# Patient Record
Sex: Female | Born: 1970 | Race: White | Hispanic: No | Marital: Single | Smoking: Never smoker
Health system: Southern US, Community
[De-identification: ages and names within clinical notes are randomized; demographics above are authoritative.]

## PROBLEM LIST (undated history)

## (undated) DIAGNOSIS — K760 Fatty (change of) liver, not elsewhere classified: Secondary | ICD-10-CM

## (undated) DIAGNOSIS — I10 Essential (primary) hypertension: Secondary | ICD-10-CM

## (undated) DIAGNOSIS — F1020 Alcohol dependence, uncomplicated: Secondary | ICD-10-CM

## (undated) DIAGNOSIS — F419 Anxiety disorder, unspecified: Secondary | ICD-10-CM

## (undated) DIAGNOSIS — E7800 Pure hypercholesterolemia, unspecified: Secondary | ICD-10-CM

## (undated) DIAGNOSIS — F329 Major depressive disorder, single episode, unspecified: Secondary | ICD-10-CM

## (undated) DIAGNOSIS — F32A Depression, unspecified: Secondary | ICD-10-CM

---

## 1998-04-08 ENCOUNTER — Ambulatory Visit (HOSPITAL_BASED_OUTPATIENT_CLINIC_OR_DEPARTMENT_OTHER): Admission: RE | Admit: 1998-04-08 | Discharge: 1998-04-08 | Payer: Self-pay | Admitting: *Deleted

## 2000-08-13 ENCOUNTER — Other Ambulatory Visit: Admission: RE | Admit: 2000-08-13 | Discharge: 2000-08-13 | Payer: Self-pay | Admitting: Obstetrics & Gynecology

## 2001-08-28 ENCOUNTER — Inpatient Hospital Stay (HOSPITAL_COMMUNITY): Admission: AD | Admit: 2001-08-28 | Discharge: 2001-08-30 | Payer: Self-pay | Admitting: Obstetrics and Gynecology

## 2001-09-26 ENCOUNTER — Other Ambulatory Visit: Admission: RE | Admit: 2001-09-26 | Discharge: 2001-09-26 | Payer: Self-pay | Admitting: Obstetrics and Gynecology

## 2002-01-16 ENCOUNTER — Other Ambulatory Visit: Admission: RE | Admit: 2002-01-16 | Discharge: 2002-01-16 | Payer: Self-pay | Admitting: Obstetrics & Gynecology

## 2003-02-16 ENCOUNTER — Other Ambulatory Visit: Admission: RE | Admit: 2003-02-16 | Discharge: 2003-02-16 | Payer: Self-pay | Admitting: Obstetrics and Gynecology

## 2003-02-17 ENCOUNTER — Other Ambulatory Visit: Admission: RE | Admit: 2003-02-17 | Discharge: 2003-02-17 | Payer: Self-pay | Admitting: Obstetrics and Gynecology

## 2003-10-11 ENCOUNTER — Inpatient Hospital Stay (HOSPITAL_COMMUNITY): Admission: AD | Admit: 2003-10-11 | Discharge: 2003-10-13 | Payer: Self-pay | Admitting: Obstetrics and Gynecology

## 2004-06-01 ENCOUNTER — Emergency Department (HOSPITAL_COMMUNITY): Admission: EM | Admit: 2004-06-01 | Discharge: 2004-06-01 | Payer: Self-pay | Admitting: *Deleted

## 2005-07-24 ENCOUNTER — Other Ambulatory Visit: Admission: RE | Admit: 2005-07-24 | Discharge: 2005-07-24 | Payer: Self-pay | Admitting: Obstetrics & Gynecology

## 2005-11-08 ENCOUNTER — Emergency Department (HOSPITAL_COMMUNITY): Admission: EM | Admit: 2005-11-08 | Discharge: 2005-11-08 | Payer: Self-pay | Admitting: Emergency Medicine

## 2006-12-31 ENCOUNTER — Ambulatory Visit: Payer: Self-pay | Admitting: Professional

## 2007-01-17 ENCOUNTER — Ambulatory Visit: Payer: Self-pay | Admitting: Professional

## 2007-02-04 ENCOUNTER — Ambulatory Visit: Payer: Self-pay | Admitting: Professional

## 2007-02-28 ENCOUNTER — Ambulatory Visit: Payer: Self-pay | Admitting: Professional

## 2007-06-10 ENCOUNTER — Emergency Department (HOSPITAL_COMMUNITY): Admission: EM | Admit: 2007-06-10 | Discharge: 2007-06-11 | Payer: Self-pay | Admitting: Emergency Medicine

## 2008-03-05 ENCOUNTER — Emergency Department (HOSPITAL_COMMUNITY): Admission: EM | Admit: 2008-03-05 | Discharge: 2008-03-05 | Payer: Self-pay | Admitting: Emergency Medicine

## 2008-03-05 ENCOUNTER — Inpatient Hospital Stay (HOSPITAL_COMMUNITY): Admission: EM | Admit: 2008-03-05 | Discharge: 2008-03-09 | Payer: Self-pay | Admitting: *Deleted

## 2008-03-05 ENCOUNTER — Ambulatory Visit: Payer: Self-pay | Admitting: *Deleted

## 2008-08-01 ENCOUNTER — Inpatient Hospital Stay (HOSPITAL_COMMUNITY): Admission: EM | Admit: 2008-08-01 | Discharge: 2008-08-06 | Payer: Self-pay | Admitting: Psychiatry

## 2008-08-01 ENCOUNTER — Ambulatory Visit: Payer: Self-pay | Admitting: Psychiatry

## 2008-08-01 ENCOUNTER — Emergency Department (HOSPITAL_COMMUNITY): Admission: EM | Admit: 2008-08-01 | Discharge: 2008-08-01 | Payer: Self-pay | Admitting: Emergency Medicine

## 2008-08-10 ENCOUNTER — Other Ambulatory Visit (HOSPITAL_COMMUNITY): Admission: RE | Admit: 2008-08-10 | Discharge: 2008-09-03 | Payer: Self-pay | Admitting: Psychiatry

## 2009-02-12 ENCOUNTER — Emergency Department (HOSPITAL_COMMUNITY): Admission: EM | Admit: 2009-02-12 | Discharge: 2009-02-12 | Payer: Self-pay | Admitting: Emergency Medicine

## 2009-05-12 ENCOUNTER — Emergency Department (HOSPITAL_COMMUNITY): Admission: EM | Admit: 2009-05-12 | Discharge: 2009-05-14 | Payer: Self-pay | Admitting: Emergency Medicine

## 2009-05-13 ENCOUNTER — Ambulatory Visit: Payer: Self-pay | Admitting: *Deleted

## 2009-05-14 ENCOUNTER — Inpatient Hospital Stay (HOSPITAL_COMMUNITY): Admission: AD | Admit: 2009-05-14 | Discharge: 2009-05-17 | Payer: Self-pay | Admitting: *Deleted

## 2011-04-04 LAB — DIFFERENTIAL
Basophils Absolute: 0 10*3/uL (ref 0.0–0.1)
Basophils Relative: 0 % (ref 0–1)
Lymphocytes Relative: 28 % (ref 12–46)
Monocytes Absolute: 0.5 10*3/uL (ref 0.1–1.0)
Neutro Abs: 4 10*3/uL (ref 1.7–7.7)
Neutrophils Relative %: 60 % (ref 43–77)

## 2011-04-04 LAB — CBC
MCV: 97.4 fL (ref 78.0–100.0)
Platelets: 187 10*3/uL (ref 150–400)
RDW: 12.5 % (ref 11.5–15.5)
WBC: 6.7 10*3/uL (ref 4.0–10.5)

## 2011-04-04 LAB — HEPATIC FUNCTION PANEL
Bilirubin, Direct: <0.1 mg/dL (ref 0.0–0.3)
Total Bilirubin: 0.6 mg/dL (ref 0.3–1.2)

## 2011-04-04 LAB — RAPID URINE DRUG SCREEN, HOSP PERFORMED
Barbiturates: NOT DETECTED
Cocaine: NOT DETECTED
Opiates: NOT DETECTED

## 2011-04-04 LAB — TRICYCLICS SCREEN, URINE: TCA Scrn: NOT DETECTED

## 2011-04-04 LAB — BASIC METABOLIC PANEL
BUN: 10 mg/dL (ref 6–23)
Calcium: 8.2 mg/dL — ABNORMAL LOW (ref 8.4–10.5)
Creatinine, Ser: 0.72 mg/dL (ref 0.4–1.2)
GFR calc non Af Amer: >60 mL/min (ref >60)
Potassium: 3.3 mEq/L — ABNORMAL LOW (ref 3.5–5.1)
Sodium: 132 mEq/L — ABNORMAL LOW (ref 135–145)

## 2011-04-04 LAB — ETHANOL: Alcohol, Ethyl (B): 269 mg/dL — ABNORMAL HIGH (ref 0–10)

## 2011-05-09 NOTE — H&P (Signed)
NAMEPEYTAN, ANDRINGA NO.:  [id number]   MEDICAL RECORD NO.:  [id number]          PATIENT TYPE:  IPS   LOCATION:  0302                          FACILITY:  [facility]   PHYSICIAN:  Jasmine Pang, M.D. DATE OF BIRTH:  1971-10-07   DATE OF ADMISSION:  03/05/2008  DATE OF DISCHARGE:                       PSYCHIATRIC ADMISSION ASSESSMENT   TIME OF THE ASSESSMENT:  10:30 a.m.   IDENTIFYING INFORMATION:  A 40 year old married white female.  This is a  voluntary admission.   HISTORY OF PRESENT ILLNESS:  The first [facility] admission for this 36-year-  old who has been drinking alcohol since high school and felt that her  drinking had gotten out of control since she lost her job in May of  2008.  She has been drinking since high school, through her college  years and when she was younger she would mostly binge drink on the  weekends and abstain during the week when she was studying.  Graduated  with honors from college and works in the Systems developer.  Over the  course of the past 5 years she has begun drinking during the week and  would typically drink about four beers after work, a little bit more on  the weekends.  Then in the past 2 years has had a lot of job pressures,  ups and downs with changes in the economy, lost her job in May of 2008.  Since that time drinking has escalated to at least 12 beers a day,  probably more on some days.  She had begun to have problems with hot  flashes and her primary care physician had given her some Xanax to  control anxiety symptoms.  She reports no history of hallucinations or  symptoms of delirium, felt that her drinking was getting out of control  and she was noticing that her mood was becoming more irritable and she  was turning into a person I didn't like.  She would like to quit  drinking completely and achieve abstinence.  Also endorses having some  chronic friction and financial stressors in her marriage and would like  to  resume counseling.  She endorses having some passive suicidal  ideation, thinking life was just not worth living like this but has no  plans for suicide and denies any history of treatment for depression or  prior suicide attempts.  Longest periods of abstinence were 9 months  during pregnancy and most recent was 4 years ago after the pregnancy  with her third child.   PAST PSYCHIATRIC HISTORY:  First treatment for alcohol abuse.  She has  been enrolled in no programs, had some counseling from a pastor in the  past and never taken any medications.  Denies any history of seizure or  brain injury.  Acknowledges a family history of father with alcoholism  and maternal grandfather with alcoholism.   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  Married for 13 years with 3 children ages 78, 66 and 77, endorses  financial stressors after losing her job last May,  also acknowledging  significant marital stress that has been getting worse for the last  couple of years aggravated by the financial problems.  She has a history  of DWIs in 1991, 2001 and 2003 but no current legal problems.   FAMILY HISTORY:  Noted above.   PRIMARY CARE PHYSICIAN:  Is Dr. Egbert Garibaldi in [place].  Medical problems are none.   CURRENT MEDICATIONS:  Xanax 1 mg prescribed t.i.d.  In fact she only  takes 1 mg at night.   PAST MEDICAL HISTORY:  LASIK surgery on her eyes in 2001.   DRUG ALLERGIES:  Are no known drug allergies.   POSITIVE PHYSICAL FINDINGS:  Physical exam done in the emergency room as  noted in the record.  Physical exam updated today.  Does note pruritus  for the past week with flushing of her skin.  No bullae, no angioedema.  No actual rash.  She said this started before admission to the unit.   DIAGNOSTIC STUDIES:  Urine drug screen positive for benzodiazepines.  Urine pregnancy test was  negative.  Routine urinalysis was normal.  CBC  - WBC 5.7, hemoglobin 15.1, hematocrit 42.3, platelets 216,000 and MCV  93.9.  Chemistry - sodium 135, potassium 3.8, chloride 98, carbon  dioxide 29, BUN 12, creatinine 0.85 and random glucose 116.  Liver  enzymes SGOT 40, SGPT 39, alkaline phosphatase 37 and total bilirubin is  0.9.  Alcohol level on presentation was 318 mg/dL.  Calcium normal at  9.2.   MENTAL STATUS EXAM:  Fully alert today, anxious, flushed skin, is  scratching herself a little bit.  Oriented x4 and in full contact with  reality.  Gives a clear and coherent history.  Speech is normal in pace,  tone, production and form.  Mood is anxious, a bit depressed.  Does get  tearful talking about the degree that she has been drinking and realizes  that it is altering her mood.  Denying any suicidal thoughts.  No  homicidal thoughts.  No signs of delirium.  Immediate recent and remote  memory intact.  Thought processes logical and coherent.  Linear, goal-  directed, wants to be abstinent from alcohol.  She has been researching  the Internet on how to get the best help, at one point attended AA and  wants to return there.  Cognition is preserved.  Thought processes  logical and coherent.   AXIS I:  EtOH abuse and dependence.  AXIS II:  Deferred.  AXIS III:  Rule out pruritus secondary to alcohol withdrawal.  AXIS IV:  Chronic marital discord and significant financial stressors.  AXIS V:  Current 38, past year 39.   PLAN:  Is to voluntarily admit the patient with a goal of safe detox in  4 days.  We are operating on the theory that her pruritus which started  prior to admission is most likely is actually formication from the  alcohol withdrawal but to be on the safe side we are going to stop the  Librium and switch her to an Ativan detox protocol.  She will also  receive thiamine and folic acid and multivitamin and additional  medications for alcohol withdrawal.  She is  enrolled in dual diagnosis  programming and will give her some trazodone to sleep on a p.r.n. basis  50 mg at bedtime p.r.n. insomnia.  Estimated length of stay is 5 days.      Margaret A. Lorin Picket, N.P.      Jasmine Pang,  M.D.  Electronically Signed    MAS/MEDQ  D:  03/06/2008  T:  03/07/2008  Job:  [id number]

## 2011-05-09 NOTE — H&P (Signed)
NAMEMarland Howell  Tiffany, Howell NO.:  [id number]   MEDICAL RECORD NO.:  [id number]          PATIENT TYPE:  IPS   LOCATION:  0602                          [facility]ACILITY:  [facility]   PHYSICIAN:  Anselm Jungling, MD  DATE OF BIRTH:  06-17-1971   DATE OF ADMISSION:  08/01/2008  DATE OF DISCHARGE:                       PSYCHIATRIC ADMISSION ASSESSMENT   This is a voluntary admission to the services of Dr. Anselm Jungling.   IDENTIFYING INFORMATION:  This is a 40 year old married white female who  presented to the emergency department at [facility] requesting  alcoholic detox.  She states she is drinking 8-10 beers a day.  She has  been trying to stop at home by herself but she develops shakes and  sweating when she does not drink.  In the emergency department her  alcohol level was 190.  Her UDS is positive for benzodiazepines.  She is  prescribed Xanax by her primary care and she uses that to help her  sleep.  She had no other abnormal lab findings that we were concerned  about.   PAST PSYCHIATRIC HISTORY:  She went to [facility].  She was here with  [place] March 12 to March 09, 2008.  She started seeing a psychologist, Dr.  Luiz Blare, in 2008.  She acknowledges a family history of her father having  alcoholism and a maternal grandfather also with alcoholism.   SOCIAL HISTORY:  She has her baccalaureate in business and Nurse, children's.  She has worked in the Education administrator, primarily in  Nurse, adult.  She has been married for 13 years with three children  ages 16, 27 and 19.  She endorses financial stress since losing her job  last May.  She was employed at [employer].  She has a past medical history for  DWIs in 1991, 2001 and 2003 but has not had any recently.  Her primary  care physician is Dr. Egbert Garibaldi in [place].   CURRENT MEDICATIONS:  She is prescribed Xanax 1 mg t.i.d. p.r.n.  although she acknowledges only taking 1 mg at bedtime.  She states this  was  originally prescribed for her as she started having panic attacks  before losing her job.   DRUG ALLERGIES:  No known drug allergies.   POSITIVE PHYSICAL FINDINGS:  Well documented and on the chart from  [facility].  Her vital signs on admission show she is 63 inches tall,  weighs 205, temperature is 98.7, blood pressure is 147/102, pulse is 98,  respirations are 16.  She does have a slight hand tremor today which is  from her detoxing and she had LASIK surgery back in 2001.   MENTAL STATUS EXAM:  Today she is alert and oriented.  She was casually  groomed and dressed.  Her hair needed fixing but she had applied her eye  makeup.  She appears to be adequately nourished.  Her speech is normal  rate, rhythm and tone.  Her mood is anxiously depressed.  Thought  processes are clear, rational and goal oriented.  Judgment and insight  are intact.  Concentration and memory intact.  Intelligence is at least  average.  She denies being suicidal or homicidal.  She denies auditory  or visual hallucinations.   DIAGNOSES:  AXIS I:  Alcohol dependence, binge pattern, panic attacks by  history.  AXIS II:  Deferred.  AXIS III:  Overweight.  AXIS IV:  Problems with primary support group, economic issues.  AXIS V:  31.   PLAN:  The plan is to admit for detox from alcohol.  Toward that end she  was started on the low dose Librium protocol.  We will also start  Campral to help prepare for alcohol cravings 666 mg t.i.d.  She has a  plan to locate a sponsor and to really go to AA meetings more frequently  than she has in the past.  Estimated length of stay is 3-5 days.      Mickie Leonarda Salon, P.A.-C.      Anselm Jungling, MD  Electronically Signed    MD/MEDQ  D:  08/02/2008  T:  08/02/2008  Job:  [id number]

## 2011-05-09 NOTE — Discharge Summary (Signed)
NAMEMarland Kitchen  Tiffany Howell, Tiffany Howell NO.:  [id number]   MEDICAL RECORD NO.:  [id number]          PATIENT TYPE:  IPS   LOCATION:  0303                          [facility]ILITY:  [facility]   PHYSICIAN:  Jasmine Pang, M.D. DATE OF BIRTH:  Nov 10, 1971   DATE OF ADMISSION:  08/01/2008  DATE OF DISCHARGE:  08/06/2008                               DISCHARGE SUMMARY   IDENTIFICATION:  This is a 40 year old married white female who was  admitted on a voluntary basis on August 01, 2008.   HISTORY OF PRESENT ILLNESS:  The patient presented to the emergency  department at [facility] requesting alcohol detox.  She states  she is drinking 8 to 10 beers a day.  She has been trying to stop at  home by herself, but developed shakes and sweating when she does not  drink.  In the emergency department, her alcohol level was 190.  Her UDS  is positive for benzodiazepines.  She is prescribed Xanax by her primary  care physician and uses that to help her sleep.  She had no other  abnormal lab findings that we were concerned about.   PAST PSYCHIATRIC HISTORY:  The patient went to [facility] in [facility].  She was  here with [place] March 12th to March 09, 2008.  She started seeing a  psychologist, Dr. Luiz Blare in 2008.   FAMILY HISTORY:  The patient acknowledges family history of her father  having alcoholism and internal grandfather also with alcoholism.   CURRENT MEDICATIONS:  The patient is prescribed Xanax 1 mg t.i.d. p.r.n.  anxiety, although she acknowledges only taking 1 mg at bedtime.  She  states this was prescribed for her after she started having panic  attacks.   MEDICAL HISTORY:  Noncontributory.   DRUG ALLERGIES:  No known drug allergies.   PHYSICAL FINDINGS:  There were no acute physical or medical problems  noted.  The patient's physical exam was done at the ED in [facility].   ADMISSION LABORATORIES:  There were no abnormal lab findings that as  assessed by our physician's assistant  Mallie Darting.   HOSPITAL COURSE:  Upon admission, the patient was started on trazodone  50 mg p.o. q.h.s. p.r.n. insomnia.  She was also started on a Librium  detox protocol with a 25 mg loading dose.  On August 02, 2008, the  patient was started on Campral 666 mg t.i.d.  She tolerated this well  with no significant side effects.  In individual sessions with me, the  patient was friendly and cooperative.  She also participated  appropriately in unit therapeutic groups and activities.  She described  her current relapse.  She is having binge-drinking problem.  She wants  to get a sponsor and go to [facility], but did not feel she could stop  the drinking without being detoxed.  She was working for [employer], which was  very stressful.  She has been laid off recently.  She has a husband and  3 children.  There is conflict between her husband and herself.  On  August 03, 2008, the patient had  talked to her husband.  It was a  distressing phone call.  She feels he is spending a lot of time with a  female friend and in the phone call was fraught with a conflict.  She  was not sleeping well.  Appetite was depressed and anxious.  On August 04, 2008, there was still some middle of the night awakening.  She woke  up and wrote a poem about the grip of alcohol on her and her attempt to  recovery.  She continued to be depressed and anxious.  She was worried  about health issues caused by alcohol.  She asked me to repeat her  hepatic panel profile to make sure her liver was okay.  This was done  and there was very slight elevation of ALT and AST, but I assured her  this would reverse if she stops drinking.  She was very focused on not  wanting to develop alcohol-related illnesses.  On August 04, 2008,  trazodone was discontinued because of her poor sleep.  She was started  on Ambien 10 mg, 1 to 2 pills p.o. q.h.s. p.r.n. insomnia.  She also had  a rash on her chest and was started on cortisone 1% cream to  the rash  p.r.n. discomfort. On August 05, 2008, the patient had talked with  husband again last eight.  She states there was more conflict between  them.  She was worried about the family session, which was going to be  held later this day.  She was placed on Neurontin 100 mg p.o. q.4 hours  p.r.n. anxiety.  She tolerated this medication well.  On August 06, 2008, mental status had improved from admission status.  The patient was  still somewhat anxious, but overall less depressed.  Affect was wide  range.  There was no suicidal or homicidal ideation.  No thoughts of  self-injurious behavior.  No auditory or visual hallucinations.  No  paranoia or delusions.  Thoughts were logical and goal-directed.  Thought content.  No predominant theme.  Cognitive was grossly intact.  Insight was good.  Judgment was good.  Impulsivity was minimal.  It was  felt the patient was safe to be discharged today.  She was upset that  the family session had been full of conflict of the day before.  According to the family therapist, the patient was the one who created  most of the conflict in arguing.  The patient was still not sure she and  her husband were going to stay together and felt he may want to leave  the marriage.  Despite of this, she was stable enough for discharge  today.   DISCHARGE DIAGNOSES:  Axis I:  Alcohol dependence, panic disorder  without agoraphobia.  Axis II:  None.  Axis III:  Overweight.  Axis IV:  Severe (problems with primary support group, economic issues,  marital conflict, burden of chemical dependence, and psychiatric  illness).  Axis V:  Global assessment of functioning was 50 upon discharge.  GAF  was 31 upon admission.  GAF highest past year was 65.   Dictation ended at this point.      Jasmine Pang, M.D.  Electronically Signed     BHS/MEDQ  D:  08/06/2008  T:  08/06/2008  Job:  [id number]
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
## 2011-05-12 NOTE — Discharge Summary (Signed)
   NAME:  Tiffany Howell, Tiffany Howell                      ACCOUNT NO.:  [id number]   MEDICAL RECORD NO.:  [id number]                   PATIENT TYPE:  INP   LOCATION:  9115                                 FACILITY:  [facility]   PHYSICIAN:  Gerrit Friends. Aldona Bar, M.D.                DATE OF BIRTH:  1971/02/14   DATE OF ADMISSION:  10/11/2003  DATE OF DISCHARGE:  10/13/2003                                 DISCHARGE SUMMARY   DISCHARGE DIAGNOSES:  1. Term pregnancy, delivered a 7 pound 9 ounce female infant, Apgars 9 and 9.  2. Blood type A+.   PROCEDURE:  1. Normal spontaneous delivery.  2. Epidural anesthesia.   SUMMARY:  This 40 year old gravida 4, para 2 was admitted at term in early  labor after an uncomplicated pregnancy.  She underwent amniotomy shortly  after admission.  She had good progression of her labor.  She subsequently  had a normal spontaneous delivery of a 7 pound 9 ounce female infant over an  intact perineum.  Reportedly, there was a mild shoulder dystocia which was  relieved with McRoberts' maneuver and delivery of the posterior shoulder.  Her subsequent postpartum course was benign.  Her discharge hemoglobin was  11.7 with a white count of 15,800, a platelet count of 150,000.  On the  morning of October 19 she was ambulating well, tolerating a regular diet  well, having normal bowel and bladder function, was afebrile.  Her breast-  feeding was going well and she was deemed ready for discharge and  accordingly was discharged.  She was given an instruction sheet at the time  of discharge and understood all instructions well.   DISCHARGE MEDICATIONS:  1. Vitamins one daily.  2. Motrin 600 mg q.6h. as needed for pain.  3. Tylox one to two q.4-6h. as needed for more severe pain.   FOLLOWUP:  She will return to the office for follow-up in approximately four  weeks' time.   CONDITION ON DISCHARGE:  Improved.                                               Gerrit Friends. Aldona Bar, M.D.    RMW/MEDQ  D:  10/13/2003  T:  10/13/2003  Job:  [id number]

## 2011-05-12 NOTE — Discharge Summary (Signed)
NAMEMarland Howell  [name], FESS NO.:  [id number]   MEDICAL RECORD NO.:  [id number]          PATIENT TYPE:  IPS   LOCATION:  0302                          FACILITY:  [facility]   PHYSICIAN:  Jasmine Pang, M.D. DATE OF BIRTH:  1971-10-20   DATE OF ADMISSION:  03/05/2008  DATE OF DISCHARGE:  03/09/2008                               DISCHARGE SUMMARY   IDENTIFICATION:  This is a 40 year old married white female who was  admitted on a voluntary basis.   HISTORY OF PRESENT ILLNESS:  This is a first [facility] admission for this 66-  year-old female who has been drinking alcohol since high school.  She  felt her drinking has gotten out of control, since she lost her job in  May 2008.  She has been drinking since high school through her college  years and when she was younger she would mostly binge drink on the  weekends and abstain during the week when she was studying.  She  graduated with honors from college and works in the Systems developer.  Over the course of the past 5 years, she has begun drinking during the  week and would typically drink about 4 beers after work a little bit  more on weekends.  In the past 2 years, she has had a lot of job  pressures ups and downs with changes in the economy and lost her job in  May 2008.  Since that time, her drinking has escalated to at least 12  bears a day, probably more on some day.  She begun to have problems with  hot flashes and her primary care physician had given her some Xanax to  control anxiety symptoms.  She reports no history of hallucinations or  symptoms of delirium.  She felt that her drinking was getting out of  control, and she was noticing that her mood was becoming more irritable.  She was turning into a person I did not like.  She would like to quit  drinking completely and achieve abstinence.  She also endorses having  some chronic friction and financial stressors in her marriage, and would  like to resume counseling.   She endorses having some passive suicidal  ideations thinking life was not worth living, but has no plans for  suicide.  She denies any history of treatment for depression or prior  suicide attempts.  Longest period of abstinence was 9 months during  pregnancy and most recent was 4 years ago after the pregnancy of her  third child.   PAST PSYCHIATRIC HISTORY:  This is the first treatment for alcohol  abuse.  She has been enrolled in no programs.  She has had some  counseling from a pastor in the past, but never taken any medications.  She denies any history of seizure or brain injury.  She acknowledges the  family history of father with alcoholism and maternal grandfather with  alcoholism.   FAMILY HISTORY:  Noted above.   MEDICAL PROBLEMS:  None.   CURRENT MEDICATIONS:  Xanax 1 mg p.o. t.i.d., in fact she only takes 1  mg at bedtime.  PAST MEDICAL HISTORY:  LASIK surgery on her eyes in 2001.   DRUG ALLERGIES:  There were no known drug allergies.   PHYSICAL FINDINGS:  Physical exam was done in the emergency room and is  noted in the record.  She had no acute medical or physical problems  except for some pruritus; for the past week with flushing of her skin.  There was no bullae.  No angioedema.  No actual rash.  She stated this  started before admission to the unit.   DIAGNOSTIC STUDIES:  Urine drug screen was positive for benzodiazepines.  Urine pregnancy test was negative.  Routine urinalysis was normal.  CBC  revealed a WBC of 5.7.  Hemoglobin of 15.1, hematocrit of 42.3,  platelets 216,000, and mean corpuscular volume 93.9.  Chemistries  revealed sodium of 135, potassium was 3.8.  Chloride of 98 and carbon  dioxide of 29, BUN of 12 and creatinine of 0.85.  Random glucose was  116.  Liver enzymes revealed an SGOT of 40, SGPT of 39, alkaline  phosphatase of 37, and total bilirubin was 0.9.  Alcohol level on  presentation was 318 mg/dL.  Calcium was normal at 9.2.    HOSPITAL COURSE:  Upon admission, the patient was started on a Librium  detox protocol.  She was friendly and cooperative in individual sessions  with me.  She also participated appropriately in unit therapeutic groups  and activities.  Therapeutic issues revolved around her stressors  including conflict with husband and raising 3 children as well as losing  her job due to downsizing.  She states her husband has been upset that  she came into the hospital and felt she could stop on her own.  She felt  he resented her for leaving the kids with him.  She was having a  difficult detox and the Librium protocol was canceled secondary to rash,  instead she was started on Ativan detox protocol.  She was also started  on trazodone 50 mg p.o. q.h.s.  The patient tolerated this protocol well  with no acute problems.  There was improvement in her mental status.  As  the hospitalization progressed, she began to want to go home.  She had  her son's surgery to attend (oral surgery).  She states she was worried  they would charge her if she does not come.  She admitted to being very  nervous about her failure for possible relapse.  The patient had a  family session with her husband.  The patient's husband expressed anger  and frustration about her being drunk every night for the past 5 years.  The family therapist discussed the need for her to stay focused on her  own recovery and have the patient with the process of her husband  gaining trust in her again.  Husband said he would go to counseling with  her, all of his responses surprised her, and she felt fairly supported.  On March 09, 2008, mental status had improved markedly from admission  status.  Detox protocol was going well without significant problems.  Sleep was improving.  Appetite was good.  Mood was less depressed and  less anxious.  Affect consistent with mood.  There was no suicidal or  homicidal ideation.  No auditory or visual  hallucinations.  No thoughts  of self-injurious behavior.  No paranoia or delusions.  Thoughts were  logical and goal directed.  Thought content, no predominant theme.  Cognitive was grossly back to baseline.  She was felt safe to be  discharged and wanted to go home.   DISCHARGE DIAGNOSES:  Axis I:  Alcohol dependence.  Axis II:  None.  Axis III:  None.  Axis IV:  Severe (chronic marital discord and significant financial  stressors with loss of job, stressor of raising 3 children).  Axis V:  Global assessment of functioning was 50 upon discharge.  GAF  was 38 upon admission.  GAF highest past year was 70.   DISCHARGE PLANS:  There was no specific activity level or dietary  restriction.   POSTHOSPITAL CARE PLANS:  The patient will see Glendell Docker for  substance abuse counseling.   DISCHARGE MEDICATIONS:  1. Claritin 10 mg daily as needed for itching.  2. Ativan 1 mg 3 times a day on March 16 and March 17 and twice a day      on March 18 and March 19, then once a day on March 21 and March 22,      then discontinue and detox will be complete.  3. Trazodone 50 mg 1-2 tablets at bedtime as needed for insomnia.     Jasmine Pang, M.D.  Electronically Signed    BHS/MEDQ  D:  04/04/2008  T:  04/04/2008  Job:  [id number]

## 2011-09-18 LAB — RAPID URINE DRUG SCREEN, HOSP PERFORMED
Amphetamines: NOT DETECTED
Benzodiazepines: POSITIVE — AB
Cocaine: NOT DETECTED
Opiates: NOT DETECTED

## 2011-09-18 LAB — PREGNANCY, URINE: Preg Test, Ur: NEGATIVE

## 2011-09-18 LAB — DIFFERENTIAL
Eosinophils Absolute: 0.1
Eosinophils Relative: 2
Lymphocytes Relative: 28
Monocytes Absolute: 0.3

## 2011-09-18 LAB — URINALYSIS, ROUTINE W REFLEX MICROSCOPIC
Bilirubin Urine: NEGATIVE
Nitrite: NEGATIVE
Protein, ur: NEGATIVE
Urobilinogen, UA: 0.2

## 2011-09-18 LAB — COMPREHENSIVE METABOLIC PANEL
ALT: 38 — ABNORMAL HIGH
AST: 40 — ABNORMAL HIGH
Albumin: 4.4
CO2: 29
Calcium: 9.2
Creatinine, Ser: 0.85
GFR calc Af Amer: >60
Potassium: 3.8
Total Bilirubin: 0.9
Total Protein: 7.6

## 2011-09-18 LAB — CBC
HCT: 42.3
Hemoglobin: 15.1 — ABNORMAL HIGH
Platelets: 216

## 2011-09-18 LAB — ETHANOL: Alcohol, Ethyl (B): 318 — ABNORMAL HIGH

## 2011-09-22 LAB — RAPID URINE DRUG SCREEN, HOSP PERFORMED
Amphetamines: NOT DETECTED
Barbiturates: NOT DETECTED
Cocaine: NOT DETECTED
Opiates: NOT DETECTED
Tetrahydrocannabinol: NOT DETECTED

## 2011-09-22 LAB — DIFFERENTIAL
Basophils Relative: 0
Eosinophils Relative: 5
Lymphocytes Relative: 46
Monocytes Absolute: 0.5
Monocytes Relative: 12

## 2011-09-22 LAB — CBC
MCHC: 33.8
Platelets: 146 — ABNORMAL LOW
RDW: 12.8

## 2011-09-22 LAB — POCT PREGNANCY, URINE: Preg Test, Ur: NEGATIVE

## 2011-09-22 LAB — HEPATIC FUNCTION PANEL: ALT: 48 — ABNORMAL HIGH

## 2012-11-15 ENCOUNTER — Emergency Department (HOSPITAL_COMMUNITY)
Admission: EM | Admit: 2012-11-15 | Discharge: 2012-11-15 | Disposition: A | Discharge status: Home / Self Care | Payer: Medicaid Other | Attending: Emergency Medicine | Admitting: Emergency Medicine

## 2012-11-15 ENCOUNTER — Encounter (HOSPITAL_COMMUNITY): Payer: Self-pay | Admitting: Emergency Medicine

## 2012-11-15 DIAGNOSIS — I10 Essential (primary) hypertension: Secondary | ICD-10-CM | POA: Insufficient documentation

## 2012-11-15 DIAGNOSIS — F1010 Alcohol abuse, uncomplicated: Secondary | ICD-10-CM | POA: Insufficient documentation

## 2012-11-15 DIAGNOSIS — F10929 Alcohol use, unspecified with intoxication, unspecified: Secondary | ICD-10-CM

## 2012-11-15 DIAGNOSIS — Z79899 Other long term (current) drug therapy: Secondary | ICD-10-CM | POA: Insufficient documentation

## 2012-11-15 DIAGNOSIS — F411 Generalized anxiety disorder: Secondary | ICD-10-CM | POA: Insufficient documentation

## 2012-11-15 HISTORY — DX: Essential (primary) hypertension: I10

## 2012-11-15 HISTORY — DX: Anxiety disorder, unspecified: F41.9

## 2012-11-15 NOTE — ED Provider Notes (Signed)
Medical screening examination/treatment/procedure(s) were performed by non-physician practitioner and as supervising physician I was immediately available for consultation/collaboration.  Olivia Mackie, MD 11/15/12 [phone]

## 2012-11-15 NOTE — ED Notes (Signed)
Woke pt up and encouraged pt to call for a ride to pick pt up.

## 2012-11-15 NOTE — ED Notes (Signed)
Pt alert, arrives from [employer], pt was arrested for DUI, brought per their policy, pt has BAH 0.34, pt resp even unlabored, skin pwd, ambulates to room

## 2012-11-15 NOTE — ED Notes (Signed)
Pt authorized calls to husband and friendMagda Paganini) to obtain a ride home. Waiting on return calls

## 2012-11-15 NOTE — ED Provider Notes (Signed)
Pt signed out to me by Ivonne Andrew, PA-C. Pt brought in by [employer] for being grossly intoxicated. She was too drunk to take to jail.   7:10am- pt now is up and walking around. Ambulated unassisted the the restroom and then back to her exam room. She is calling around trying to find a ride home. Pt ready to be discharged at this time.  Pt has been advised of the symptoms that warrant their return to the ED. Patient has voiced understanding and has agreed to follow-up with the PCP or specialist.   Dorthula Matas, PA 11/15/12 [phone]

## 2012-11-15 NOTE — ED Provider Notes (Signed)
History     CSN: [id number]  Arrival date & time 11/15/12  0015   First MD Initiated Contact with Patient 11/15/12 0030      Chief Complaint  Patient presents with  . Alcohol Intoxication   HPI  History provided by patient and [employer] officer.  Patient is a 41 year old female with history of hypertension and anxiety who presents with alcohol intoxication. Please were called by local bar where patient became excessively intoxicated. Patient was arrested for DUI and had alcohol level of 0.345 breathalyzer. Due to the highly elevated alcohol level patient was brought to the emergency room for further evaluation. Patient has no other complaints. She states she has been drinking more excessively recently after separation with her husband. Patient has 3 children at home ages 30, 5 and 65 and states that officers and plan to contact her ex-husband watch the children.    Past Medical History  Diagnosis Date  . Hypertension   . Anxiety     History reviewed. No pertinent past surgical history.  No family history on file.  History  Substance Use Topics  . Smoking status: Never Smoker   . Smokeless tobacco: Not on file  . Alcohol Use: Yes    OB History    Grav Para Term Preterm Abortions TAB SAB Ect Mult Living                  Review of Systems  Unable to perform ROS: Other  Constitutional: Negative for fever and chills.  Respiratory: Negative for cough.   Cardiovascular: Negative for chest pain.  Gastrointestinal: Negative for nausea, vomiting and abdominal pain.  Neurological: Negative for headaches.  Psychiatric/Behavioral: Negative for confusion.  All other systems reviewed and are negative.    Allergies  Review of patient's allergies indicates no known allergies.  Home Medications   Current Outpatient Rx  Name  Route  Sig  Dispense  Refill  . ALPRAZOLAM 1 MG PO TABS   Oral   Take 1 mg by mouth 3 (three) times daily as needed. For anxiety         . LISINOPRIL  10 MG PO TABS   Oral   Take 10 mg by mouth daily.         Marland Kitchen ROSUVASTATIN CALCIUM 20 MG PO TABS   Oral   Take 20 mg by mouth every evening.           BP 135/82  Pulse 105  Temp 98 F (36.7 C)  Resp 16  Wt 200 lb (90.719 kg)  SpO2 99%  Physical Exam  Nursing note and vitals reviewed. Constitutional: She is oriented to person, place, and time. She appears well-developed and well-nourished. No distress.  HENT:  Head: Normocephalic and atraumatic.  Eyes: EOM are normal. Pupils are equal, round, and reactive to light.  Cardiovascular: Normal rate and regular rhythm.   Pulmonary/Chest: Effort normal and breath sounds normal. No respiratory distress. She has no wheezes. She has no rales.  Abdominal: Soft. There is no tenderness. There is no rebound and no guarding.  Neurological: She is alert and oriented to person, place, and time.  Skin: Skin is warm and dry.  Psychiatric: She has a normal mood and affect. Her behavior is normal.    ED Course  Procedures     1. Alcohol intoxication       MDM  Patient seen and evaluated. Patient awake and alert. Patient without any other complaints.  Patient currently does not have  a safe ride home. Patient's ex-husband was contacted and does have custody of children at this time. He is not willing to come pick patient up to take home.   At this time we'll plan to have patient remain in the emergency room so she is clinically sober and safe to discharge home.   Pt discussed in sign out with Ellin Saba PA-C.  She will wait for pt to continue to sober and safely take bus home.  Angus Seller, [place] 11/15/12 [phone]

## 2012-11-15 NOTE — ED Provider Notes (Signed)
Medical screening examination/treatment/procedure(s) were performed by non-physician practitioner and as supervising physician I was immediately available for consultation/collaboration.  Olivia Mackie, MD 11/15/12 [phone]

## 2014-02-25 ENCOUNTER — Other Ambulatory Visit: Payer: Self-pay | Admitting: Obstetrics & Gynecology

## 2014-02-25 DIAGNOSIS — R928 Other abnormal and inconclusive findings on diagnostic imaging of breast: Secondary | ICD-10-CM

## 2014-03-24 ENCOUNTER — Ambulatory Visit
Admission: RE | Admit: 2014-03-24 | Discharge: 2014-03-24 | Disposition: A | Discharge status: Home / Self Care | Payer: Medicaid Other | Source: Ambulatory Visit | Attending: Obstetrics & Gynecology | Admitting: Obstetrics & Gynecology

## 2014-03-24 DIAGNOSIS — R928 Other abnormal and inconclusive findings on diagnostic imaging of breast: Secondary | ICD-10-CM

## 2014-04-21 ENCOUNTER — Encounter (HOSPITAL_COMMUNITY): Payer: Self-pay | Admitting: Emergency Medicine

## 2014-04-21 ENCOUNTER — Emergency Department (HOSPITAL_COMMUNITY): Payer: Managed Care, Other (non HMO)

## 2014-04-21 ENCOUNTER — Emergency Department (HOSPITAL_COMMUNITY)
Admission: EM | Admit: 2014-04-21 | Discharge: 2014-04-21 | Disposition: A | Discharge status: Home / Self Care | Payer: Managed Care, Other (non HMO) | Attending: Emergency Medicine | Admitting: Emergency Medicine

## 2014-04-21 DIAGNOSIS — I10 Essential (primary) hypertension: Secondary | ICD-10-CM | POA: Insufficient documentation

## 2014-04-21 DIAGNOSIS — Y929 Unspecified place or not applicable: Secondary | ICD-10-CM | POA: Insufficient documentation

## 2014-04-21 DIAGNOSIS — S91339A Puncture wound without foreign body, unspecified foot, initial encounter: Secondary | ICD-10-CM

## 2014-04-21 DIAGNOSIS — Y9389 Activity, other specified: Secondary | ICD-10-CM | POA: Insufficient documentation

## 2014-04-21 DIAGNOSIS — S91309A Unspecified open wound, unspecified foot, initial encounter: Secondary | ICD-10-CM | POA: Insufficient documentation

## 2014-04-21 DIAGNOSIS — Z79899 Other long term (current) drug therapy: Secondary | ICD-10-CM | POA: Insufficient documentation

## 2014-04-21 DIAGNOSIS — F411 Generalized anxiety disorder: Secondary | ICD-10-CM | POA: Insufficient documentation

## 2014-04-21 DIAGNOSIS — Z23 Encounter for immunization: Secondary | ICD-10-CM | POA: Insufficient documentation

## 2014-04-21 DIAGNOSIS — W268XXA Contact with other sharp object(s), not elsewhere classified, initial encounter: Secondary | ICD-10-CM | POA: Insufficient documentation

## 2014-04-21 MED ORDER — TETANUS-DIPHTH-ACELL PERTUSSIS 5-2.5-18.5 LF-MCG/0.5 IM SUSP
0.5000 mL | Freq: Once | INTRAMUSCULAR | Status: AC
  Administered 2014-04-21: 0.5 mL via INTRAMUSCULAR
  Filled 2014-04-21: qty 0.5

## 2014-04-21 MED ORDER — HYDROCODONE-ACETAMINOPHEN 5-325 MG PO TABS
1.0000 | ORAL_TABLET | Freq: Once | ORAL | Status: AC
  Administered 2014-04-21: 1 via ORAL
  Filled 2014-04-21: qty 1

## 2014-04-21 MED ORDER — AMOXICILLIN-POT CLAVULANATE 875-125 MG PO TABS
1.0000 | ORAL_TABLET | Freq: Once | ORAL | Status: AC
  Administered 2014-04-21: 1 via ORAL
  Filled 2014-04-21: qty 1

## 2014-04-21 MED ORDER — AMOXICILLIN-POT CLAVULANATE 875-125 MG PO TABS: 1.0000 | ORAL_TABLET | Freq: Two times a day (BID) | ORAL | Status: DC

## 2014-04-21 MED ORDER — IBUPROFEN 800 MG PO TABS: 800.0000 mg | ORAL_TABLET | Freq: Three times a day (TID) | ORAL | Status: DC | PRN

## 2014-04-21 MED ORDER — HYDROCODONE-ACETAMINOPHEN 5-325 MG PO TABS: 1.0000 | ORAL_TABLET | ORAL | Status: DC | PRN

## 2014-04-21 MED ORDER — IBUPROFEN 800 MG PO TABS
800.0000 mg | ORAL_TABLET | Freq: Once | ORAL | Status: AC
  Administered 2014-04-21: 800 mg via ORAL
  Filled 2014-04-21: qty 1

## 2014-04-21 NOTE — Discharge Instructions (Signed)
Was actually feeling a followup should and strong new puncture wounds also underwent informed by you. Prescribed an antibiotic as a portion also been given 2 different prescriptions one for Vicodin as well ibuprofen for additional pain control church her foot elevated tonight as much as possible

## 2014-04-21 NOTE — ED Provider Notes (Signed)
CSN: [id number]     Arrival date & time 04/21/14  2138 History  This chart was scribed for non-physician practitioner, Earley FavorGail Monroe Toure, NP, working with Raeford RazorStephen Kohut, MD by Smiley HousemanFallon Davis, ED Scribe. This patient was seen in room WTR8/WTR8 and the patient's care was started at 9:53 PM.  Chief Complaint  Patient presents with  . Foot Injury   The history is provided by the patient. No language interpreter was used.   HPI Comments: Tiffany BoltStephanie C Howell is a 43 y.o. female who presents to the Emergency Department complaining of an injury to her right foot.  Pt states she stepped on a rusty nail about three hours PTA.  She states the nail was lodged into a wooden board when she stepped on it.  She states the nail went through her shoe and into her foot.  She states after she stepped on the nail she had to shake the wooden board away from her foot.  Pt states she is in a lot of pain.  She is unsure when her last tetanus shot was.    Past Medical History  Diagnosis Date  . Hypertension   . Anxiety    History reviewed. No pertinent past surgical history. History reviewed. No pertinent family history. History  Substance Use Topics  . Smoking status: Never Smoker   . Smokeless tobacco: Not on file  . Alcohol Use: Yes   OB History   Grav Para Term Preterm Abortions TAB SAB Ect Mult Living                 Review of Systems  Constitutional: Negative for fever and chills.  Gastrointestinal: Negative for nausea, vomiting, abdominal pain and diarrhea.  Musculoskeletal: Negative for joint swelling.  Skin: Positive for wound (Nail puncture to right foot.  ). Negative for color change and rash.  Psychiatric/Behavioral: Negative for behavioral problems and confusion.  All other systems reviewed and are negative.   Allergies  Review of patient's allergies indicates no known allergies.  Home Medications   Prior to Admission medications   Medication Sig Start Date End Date Taking? Authorizing Provider   ALPRAZolam Prudy Feeler(XANAX) 1 MG tablet Take 1 mg by mouth 3 (three) times daily as needed. For anxiety    Historical Provider, MD  lisinopril-hydrochlorothiazide (PRINZIDE,ZESTORETIC) 20-12.5 MG per tablet Take 1 tablet by mouth daily.    Historical Provider, MD  rosuvastatin (CRESTOR) 5 MG tablet Take 5 mg by mouth daily.    Historical Provider, MD  zolpidem (AMBIEN) 5 MG tablet Take 5 mg by mouth at bedtime as needed. For sleep    Historical Provider, MD   Triage Vitals: BP 105/65  Pulse 92  Temp(Src) 98.4 F (36.9 C) (Oral)  Resp 18  SpO2 100%  Physical Exam  Nursing note and vitals reviewed. Constitutional: She is oriented to person, place, and time. She appears well-developed and well-nourished. No distress.  HENT:  Head: Normocephalic and atraumatic.  Eyes: EOM are normal.  Neck: Neck supple. No tracheal deviation present.  Cardiovascular: Normal rate.   Pulmonary/Chest: Effort normal. No respiratory distress.  Musculoskeletal: Normal range of motion.  Neurological: She is alert and oriented to person, place, and time.  Skin: Skin is warm and dry. No rash noted. No erythema.  Superficial puncture wound approximately 5 mm distal to the joint.   Psychiatric: She has a normal mood and affect. Her behavior is normal.    ED Course  Procedures (including critical care time) DIAGNOSTIC STUDIES: Oxygen Saturation is  100% on RA, normal by my interpretation.    COORDINATION OF CARE: 10:22 PM-Will order x-ray of right foot.  Patient informed of current plan of treatment and evaluation and agrees with plan.    Labs Review Labs Reviewed - No data to display  Imaging Review Dg Foot Complete Right  04/21/2014   CLINICAL DATA:  Stepped on nail.  Puncture wound lateral mid foot.  EXAM: RIGHT FOOT COMPLETE - 3+ VIEW  COMPARISON:  None.  FINDINGS: There is no evidence of fracture or dislocation. Hallux valgus. Apparent bipartite first metatarsal tibial sesamoid. There is no evidence of  arthropathy or other focal bone abnormality. Soft tissues are unremarkable, no radiopaque foreign bodies or subcutaneous gas.  IMPRESSION: No acute osseous process ; no radiopaque foreign bodies.   Electronically Signed   By: Awilda Metroourtnay  Bloomer   On: 04/21/2014 22:47    MDM  Puncture wound was so minimal I had to have patient point out the area was injured fracture reviewed and are no retained foreign bodies she-as a precaution she's been given a prescription for ibuprofen vision and was new versus she also was given a prescription for 6 Vicodin that she can use for severe pain Final diagnoses:  Puncture wound of foot    I personally performed the services described in this documentation, which was scribed in my presence. The recorded information has been reviewed and is accurate.     Arman FilterGail K Carvin Almas, NP 04/21/14 2303

## 2014-04-21 NOTE — ED Notes (Signed)
Pt reports that she stepped on a rusty nail x3 hours ago to her R foot. Pt states she is in pain and does not know her last tetanus booster date.  Pt states [facility] waiting and left. Pt verbally aggressive in triage. Pt a&o x4, NAD noted at this time.

## 2014-04-21 NOTE — ED Notes (Signed)
Pt has a ride home.  

## 2014-04-23 NOTE — ED Provider Notes (Signed)
Medical screening examination/treatment/procedure(s) were performed by non-physician practitioner and as supervising physician I was immediately available for consultation/collaboration.   EKG Interpretation None       Kiing Deakin, MD 04/23/14 1322 

## 2014-08-14 ENCOUNTER — Other Ambulatory Visit: Payer: Self-pay | Admitting: Obstetrics & Gynecology

## 2014-08-14 DIAGNOSIS — R921 Mammographic calcification found on diagnostic imaging of breast: Secondary | ICD-10-CM

## 2014-09-08 ENCOUNTER — Ambulatory Visit
Admission: RE | Admit: 2014-09-08 | Discharge: 2014-09-08 | Disposition: A | Discharge status: Home / Self Care | Payer: Managed Care, Other (non HMO) | Source: Ambulatory Visit | Attending: Obstetrics & Gynecology | Admitting: Obstetrics & Gynecology

## 2014-09-08 DIAGNOSIS — R921 Mammographic calcification found on diagnostic imaging of breast: Secondary | ICD-10-CM

## 2015-01-30 ENCOUNTER — Encounter (HOSPITAL_COMMUNITY): Payer: Self-pay | Admitting: Emergency Medicine

## 2015-01-30 ENCOUNTER — Emergency Department (HOSPITAL_COMMUNITY): Payer: Managed Care, Other (non HMO)

## 2015-01-30 ENCOUNTER — Emergency Department (HOSPITAL_COMMUNITY)
Admission: EM | Admit: 2015-01-30 | Discharge: 2015-01-31 | Disposition: A | Discharge status: Home / Self Care | Payer: Managed Care, Other (non HMO) | Attending: Emergency Medicine | Admitting: Emergency Medicine

## 2015-01-30 DIAGNOSIS — F10129 Alcohol abuse with intoxication, unspecified: Secondary | ICD-10-CM | POA: Insufficient documentation

## 2015-01-30 DIAGNOSIS — Y998 Other external cause status: Secondary | ICD-10-CM | POA: Diagnosis not present

## 2015-01-30 DIAGNOSIS — W06XXXA Fall from bed, initial encounter: Secondary | ICD-10-CM | POA: Insufficient documentation

## 2015-01-30 DIAGNOSIS — Y92092 Bedroom in other non-institutional residence as the place of occurrence of the external cause: Secondary | ICD-10-CM | POA: Insufficient documentation

## 2015-01-30 DIAGNOSIS — M545 Low back pain: Secondary | ICD-10-CM

## 2015-01-30 DIAGNOSIS — G8929 Other chronic pain: Secondary | ICD-10-CM | POA: Diagnosis not present

## 2015-01-30 DIAGNOSIS — S3992XA Unspecified injury of lower back, initial encounter: Secondary | ICD-10-CM | POA: Insufficient documentation

## 2015-01-30 DIAGNOSIS — Y9389 Activity, other specified: Secondary | ICD-10-CM | POA: Insufficient documentation

## 2015-01-30 DIAGNOSIS — F1010 Alcohol abuse, uncomplicated: Secondary | ICD-10-CM

## 2015-01-30 DIAGNOSIS — I10 Essential (primary) hypertension: Secondary | ICD-10-CM | POA: Diagnosis not present

## 2015-01-30 DIAGNOSIS — Z792 Long term (current) use of antibiotics: Secondary | ICD-10-CM | POA: Insufficient documentation

## 2015-01-30 DIAGNOSIS — E669 Obesity, unspecified: Secondary | ICD-10-CM | POA: Insufficient documentation

## 2015-01-30 NOTE — ED Notes (Signed)
Bed: WA23 Expected date:  Expected time:  Means of arrival:  Comments: EMS/fall/back pain 

## 2015-01-30 NOTE — ED Notes (Signed)
Per EMS , pt. From home with complaint of back pain at 10/10 after a fall at 9pm this evening, pt. States she rolled out of bed and fell, had drinks of beer earlier this evening. Alert and oriented x3. Pt. Has chronic back pain . Denies LOC.

## 2015-01-31 LAB — COMPREHENSIVE METABOLIC PANEL
ALT: 57 U/L — ABNORMAL HIGH (ref 0–35)
AST: 142 U/L — ABNORMAL HIGH (ref 0–37)
Albumin: 3.8 g/dL (ref 3.5–5.2)
Alkaline Phosphatase: 70 U/L (ref 39–117)
Anion gap: 12 (ref 5–15)
BUN: 12 mg/dL (ref 6–23)
CO2: 24 mmol/L (ref 19–32)
Calcium: 8.4 mg/dL (ref 8.4–10.5)
Chloride: 101 mmol/L (ref 96–112)
Creatinine, Ser: 0.61 mg/dL (ref 0.50–1.10)
GFR calc Af Amer: >90 mL/min (ref >90)
GFR calc non Af Amer: >90 mL/min (ref >90)
Glucose, Bld: 115 mg/dL — ABNORMAL HIGH (ref 70–99)
Potassium: 3.1 mmol/L — ABNORMAL LOW (ref 3.5–5.1)
Sodium: 137 mmol/L (ref 135–145)
Total Bilirubin: 1.8 mg/dL — ABNORMAL HIGH (ref 0.3–1.2)
Total Protein: 7.3 g/dL (ref 6.0–8.3)

## 2015-01-31 LAB — CBC WITH DIFFERENTIAL/PLATELET
Basophils Absolute: 0 10*3/uL (ref 0.0–0.1)
Basophils Relative: 0 % (ref 0–1)
Eosinophils Absolute: 0.1 10*3/uL (ref 0.0–0.7)
Eosinophils Relative: 1 % (ref 0–5)
HCT: 40.2 % (ref 36.0–46.0)
Hemoglobin: 14 g/dL (ref 12.0–15.0)
Lymphocytes Relative: 31 % (ref 12–46)
Lymphs Abs: 2 10*3/uL (ref 0.7–4.0)
MCH: 33.2 pg (ref 26.0–34.0)
MCHC: 34.8 g/dL (ref 30.0–36.0)
MCV: 95.3 fL (ref 78.0–100.0)
Monocytes Absolute: 0.6 10*3/uL (ref 0.1–1.0)
Monocytes Relative: 9 % (ref 3–12)
Neutro Abs: 3.8 10*3/uL (ref 1.7–7.7)
Neutrophils Relative %: 58 % (ref 43–77)
Platelets: 84 10*3/uL — ABNORMAL LOW (ref 150–400)
RBC: 4.22 MIL/uL (ref 3.87–5.11)
RDW: 13.9 % (ref 11.5–15.5)
WBC: 6.5 10*3/uL (ref 4.0–10.5)

## 2015-01-31 LAB — RAPID URINE DRUG SCREEN, HOSP PERFORMED
Amphetamines: NOT DETECTED
Barbiturates: NOT DETECTED
Benzodiazepines: NOT DETECTED
Cocaine: NOT DETECTED
Opiates: NOT DETECTED
Tetrahydrocannabinol: NOT DETECTED

## 2015-01-31 LAB — URINE MICROSCOPIC-ADD ON

## 2015-01-31 LAB — URINALYSIS, ROUTINE W REFLEX MICROSCOPIC
Bilirubin Urine: NEGATIVE
Glucose, UA: NEGATIVE mg/dL
Hgb urine dipstick: NEGATIVE
Ketones, ur: NEGATIVE mg/dL
Nitrite: NEGATIVE
Protein, ur: NEGATIVE mg/dL
Specific Gravity, Urine: 1.006 (ref 1.005–1.030)
Urobilinogen, UA: 0.2 mg/dL (ref 0.0–1.0)
pH: 7 (ref 5.0–8.0)

## 2015-01-31 LAB — ETHANOL: Alcohol, Ethyl (B): 391 mg/dL — ABNORMAL HIGH (ref 0–9)

## 2015-01-31 MED ORDER — VITAMIN B-1 100 MG PO TABS
100.0000 mg | ORAL_TABLET | Freq: Once | ORAL | Status: AC
  Administered 2015-01-31: 100 mg via ORAL
  Filled 2015-01-31: qty 1

## 2015-01-31 MED ORDER — DIAZEPAM 5 MG PO TABS
5.0000 mg | ORAL_TABLET | Freq: Once | ORAL | Status: AC
Start: 2015-01-31 — End: 2015-01-31
  Administered 2015-01-31: 5 mg via ORAL
  Filled 2015-01-31: qty 1

## 2015-01-31 MED ORDER — ONDANSETRON 4 MG PO TBDP
4.0000 mg | ORAL_TABLET | Freq: Once | ORAL | Status: AC
  Administered 2015-01-31: 4 mg via ORAL
  Filled 2015-01-31: qty 1

## 2015-01-31 MED ORDER — ADULT MULTIVITAMIN W/MINERALS CH
1.0000 | ORAL_TABLET | Freq: Once | ORAL | Status: AC
  Administered 2015-01-31: 1 via ORAL
  Filled 2015-01-31: qty 1

## 2015-01-31 MED ORDER — POTASSIUM CHLORIDE CRYS ER 20 MEQ PO TBCR
40.0000 meq | EXTENDED_RELEASE_TABLET | Freq: Once | ORAL | Status: AC
  Administered 2015-01-31: 40 meq via ORAL
  Filled 2015-01-31: qty 2

## 2015-01-31 NOTE — Discharge Instructions (Signed)
Alcohol Use Disorder Alcohol use disorder is a mental disorder. It is not a one-time incident of heavy drinking. Alcohol use disorder is the excessive and uncontrollable use of alcohol over time that leads to problems with functioning in one or more areas of daily living. People with this disorder risk harming themselves and others when they drink to excess. Alcohol use disorder also can cause other mental disorders, such as mood and anxiety disorders, and serious physical problems. People with alcohol use disorder often misuse other drugs.  Alcohol use disorder is common and widespread. Some people with this disorder drink alcohol to cope with or escape from negative life events. Others drink to relieve chronic pain or symptoms of mental illness. People with a family history of alcohol use disorder are at higher risk of losing control and using alcohol to excess.  SYMPTOMS  Signs and symptoms of alcohol use disorder may include the following:   Consumption ofalcohol inlarger amounts or over a longer period of time than intended.  Multiple unsuccessful attempts to cutdown or control alcohol use.   A great deal of time spent obtaining alcohol, using alcohol, or recovering from the effects of alcohol (hangover).  A strong desire or urge to use alcohol (cravings).   Continued use of alcohol despite problems at work, school, or home because of alcohol use.   Continued use of alcohol despite problems in relationships because of alcohol use.  Continued use of alcohol in situations when it is physically hazardous, such as driving a car.  Continued use of alcohol despite awareness of a physical or psychological problem that is likely related to alcohol use. Physical problems related to alcohol use can involve the brain, heart, liver, stomach, and intestines. Psychological problems related to alcohol use include intoxication, depression, anxiety, psychosis, delirium, and dementia.   The need for  increased amounts of alcohol to achieve the same desired effect, or a decreased effect from the consumption of the same amount of alcohol (tolerance).  Withdrawal symptoms upon reducing or stopping alcohol use, or alcohol use to reduce or avoid withdrawal symptoms. Withdrawal symptoms include:  Racing heart.  Hand tremor.  Difficulty sleeping.  Nausea.  Vomiting.  Hallucinations.  Restlessness.  Seizures. DIAGNOSIS Alcohol use disorder is diagnosed through an assessment by your health care provider. Your health care provider may start by asking three or four questions to screen for excessive or problematic alcohol use. To confirm a diagnosis of alcohol use disorder, at least two symptoms must be present within a 12-month period. The severity of alcohol use disorder depends on the number of symptoms:  Mild--two or three.  Moderate--four or five.  Severe--six or more. Your health care provider may perform a physical exam or use results from lab tests to see if you have physical problems resulting from alcohol use. Your health care provider may refer you to a mental health professional for evaluation. TREATMENT  Some people with alcohol use disorder are able to reduce their alcohol use to low-risk levels. Some people with alcohol use disorder need to quit drinking alcohol. When necessary, mental health professionals with specialized training in substance use treatment can help. Your health care provider can help you decide how severe your alcohol use disorder is and what type of treatment you need. The following forms of treatment are available:   Detoxification. Detoxification involves the use of prescription medicines to prevent alcohol withdrawal symptoms in the first week after quitting. This is important for people with a history of symptoms   of withdrawal and for heavy drinkers who are likely to have withdrawal symptoms. Alcohol withdrawal can be dangerous and, in severe cases, cause  death. Detoxification is usually provided in a hospital or in-patient substance use treatment facility.  Counseling or talk therapy. Talk therapy is provided by substance use treatment counselors. It addresses the reasons people use alcohol and ways to keep them from drinking again. The goals of talk therapy are to help people with alcohol use disorder find healthy activities and ways to cope with life stress, to identify and avoid triggers for alcohol use, and to handle cravings, which can cause relapse.  Medicines.Different medicines can help treat alcohol use disorder through the following actions:  Decrease alcohol cravings.  Decrease the positive reward response felt from alcohol use.  Produce an uncomfortable physical reaction when alcohol is used (aversion therapy).  Support groups. Support groups are run by people who have quit drinking. They provide emotional support, advice, and guidance. These forms of treatment are often combined. Some people with alcohol use disorder benefit from intensive combination treatment provided by specialized substance use treatment centers. Both inpatient and outpatient treatment programs are available. Document Released: 01/18/2005 Document Revised: 04/27/2014 Document Reviewed: 03/20/2013 [employer] Patient Information 2015 [employer]. This information is not intended to replace advice given to you by your health care provider. Make sure you discuss any questions you have with your health care provider.  

## 2015-02-04 ENCOUNTER — Inpatient Hospital Stay (HOSPITAL_COMMUNITY): Payer: Managed Care, Other (non HMO)

## 2015-02-04 ENCOUNTER — Inpatient Hospital Stay (HOSPITAL_COMMUNITY)
Admission: EM | Admit: 2015-02-04 | Discharge: 2015-02-07 | DRG: 391 | Disposition: A | Discharge status: Home / Self Care | Payer: Managed Care, Other (non HMO) | Attending: Internal Medicine | Admitting: Internal Medicine

## 2015-02-04 ENCOUNTER — Encounter (HOSPITAL_COMMUNITY): Payer: Self-pay | Admitting: *Deleted

## 2015-02-04 DIAGNOSIS — D6959 Other secondary thrombocytopenia: Secondary | ICD-10-CM | POA: Diagnosis present

## 2015-02-04 DIAGNOSIS — R109 Unspecified abdominal pain: Secondary | ICD-10-CM | POA: Diagnosis present

## 2015-02-04 DIAGNOSIS — R112 Nausea with vomiting, unspecified: Secondary | ICD-10-CM | POA: Diagnosis present

## 2015-02-04 DIAGNOSIS — Y908 Blood alcohol level of 240 mg/100 ml or more: Secondary | ICD-10-CM | POA: Diagnosis present

## 2015-02-04 DIAGNOSIS — F10230 Alcohol dependence with withdrawal, uncomplicated: Secondary | ICD-10-CM | POA: Diagnosis present

## 2015-02-04 DIAGNOSIS — I10 Essential (primary) hypertension: Secondary | ICD-10-CM | POA: Diagnosis present

## 2015-02-04 DIAGNOSIS — E872 Acidosis: Secondary | ICD-10-CM | POA: Diagnosis present

## 2015-02-04 DIAGNOSIS — E876 Hypokalemia: Secondary | ICD-10-CM | POA: Diagnosis present

## 2015-02-04 DIAGNOSIS — R945 Abnormal results of liver function studies: Secondary | ICD-10-CM | POA: Diagnosis present

## 2015-02-04 DIAGNOSIS — E780 Pure hypercholesterolemia: Secondary | ICD-10-CM | POA: Diagnosis present

## 2015-02-04 DIAGNOSIS — E871 Hypo-osmolality and hyponatremia: Secondary | ICD-10-CM

## 2015-02-04 DIAGNOSIS — E785 Hyperlipidemia, unspecified: Secondary | ICD-10-CM | POA: Diagnosis present

## 2015-02-04 DIAGNOSIS — K7010 Alcoholic hepatitis without ascites: Secondary | ICD-10-CM | POA: Diagnosis present

## 2015-02-04 DIAGNOSIS — R42 Dizziness and giddiness: Secondary | ICD-10-CM

## 2015-02-04 DIAGNOSIS — I951 Orthostatic hypotension: Secondary | ICD-10-CM

## 2015-02-04 DIAGNOSIS — F419 Anxiety disorder, unspecified: Secondary | ICD-10-CM | POA: Diagnosis present

## 2015-02-04 DIAGNOSIS — E860 Dehydration: Secondary | ICD-10-CM | POA: Diagnosis present

## 2015-02-04 DIAGNOSIS — H5509 Other forms of nystagmus: Secondary | ICD-10-CM | POA: Diagnosis present

## 2015-02-04 DIAGNOSIS — K852 Alcohol induced acute pancreatitis: Secondary | ICD-10-CM | POA: Diagnosis present

## 2015-02-04 DIAGNOSIS — R0602 Shortness of breath: Secondary | ICD-10-CM

## 2015-02-04 DIAGNOSIS — F10920 Alcohol use, unspecified with intoxication, uncomplicated: Secondary | ICD-10-CM

## 2015-02-04 DIAGNOSIS — R1084 Generalized abdominal pain: Secondary | ICD-10-CM

## 2015-02-04 DIAGNOSIS — R748 Abnormal levels of other serum enzymes: Secondary | ICD-10-CM

## 2015-02-04 DIAGNOSIS — R197 Diarrhea, unspecified: Secondary | ICD-10-CM | POA: Diagnosis present

## 2015-02-04 DIAGNOSIS — R7989 Other specified abnormal findings of blood chemistry: Secondary | ICD-10-CM | POA: Diagnosis present

## 2015-02-04 HISTORY — DX: Pure hypercholesterolemia, unspecified: E78.00

## 2015-02-04 LAB — CBC WITH DIFFERENTIAL/PLATELET
Basophils Absolute: 0 10*3/uL (ref 0.0–0.1)
Basophils Relative: 0 % (ref 0–1)
Eosinophils Absolute: 0.1 10*3/uL (ref 0.0–0.7)
Eosinophils Relative: 1 % (ref 0–5)
HCT: 36.4 % (ref 36.0–46.0)
Hemoglobin: 13.1 g/dL (ref 12.0–15.0)
LYMPHS ABS: 1.4 10*3/uL (ref 0.7–4.0)
LYMPHS PCT: 18 % (ref 12–46)
MCH: 32.4 pg (ref 26.0–34.0)
MCHC: 36 g/dL (ref 30.0–36.0)
MCV: 90.1 fL (ref 78.0–100.0)
MONOS PCT: 12 % (ref 3–12)
Monocytes Absolute: 0.9 10*3/uL (ref 0.1–1.0)
NEUTROS ABS: 5.2 10*3/uL (ref 1.7–7.7)
NEUTROS PCT: 69 % (ref 43–77)
PLATELETS: 101 10*3/uL — AB (ref 150–400)
RBC: 4.04 MIL/uL (ref 3.87–5.11)
RDW: 13.9 % (ref 11.5–15.5)
WBC: 7.5 10*3/uL (ref 4.0–10.5)

## 2015-02-04 LAB — BASIC METABOLIC PANEL
Anion gap: 16 — ABNORMAL HIGH (ref 5–15)
BUN: 8 mg/dL (ref 6–23)
CALCIUM: 8.4 mg/dL (ref 8.4–10.5)
CO2: 21 mmol/L (ref 19–32)
CREATININE: 0.66 mg/dL (ref 0.50–1.10)
Chloride: 92 mmol/L — ABNORMAL LOW (ref 96–112)
GFR calc Af Amer: >90 mL/min (ref >90)
GLUCOSE: 125 mg/dL — AB (ref 70–99)
Potassium: 3.2 mmol/L — ABNORMAL LOW (ref 3.5–5.1)
SODIUM: 129 mmol/L — AB (ref 135–145)

## 2015-02-04 LAB — PROTIME-INR
INR: 1.21 (ref 0.00–1.49)
Prothrombin Time: 15.5 seconds — ABNORMAL HIGH (ref 11.6–15.2)

## 2015-02-04 LAB — HEPATIC FUNCTION PANEL
ALK PHOS: 106 U/L (ref 39–117)
ALT: 99 U/L — AB (ref 0–35)
AST: 312 U/L — ABNORMAL HIGH (ref 0–37)
Albumin: 3.5 g/dL (ref 3.5–5.2)
BILIRUBIN TOTAL: 2.8 mg/dL — AB (ref 0.3–1.2)
Bilirubin, Direct: 1.1 mg/dL — ABNORMAL HIGH (ref 0.0–0.5)
Indirect Bilirubin: 1.7 mg/dL — ABNORMAL HIGH (ref 0.3–0.9)
Total Protein: 7.2 g/dL (ref 6.0–8.3)

## 2015-02-04 LAB — COMPREHENSIVE METABOLIC PANEL
ALBUMIN: 3.5 g/dL (ref 3.5–5.2)
ALT: 88 U/L — ABNORMAL HIGH (ref 0–35)
AST: 265 U/L — AB (ref 0–37)
Alkaline Phosphatase: 96 U/L (ref 39–117)
Anion gap: 13 (ref 5–15)
BUN: 9 mg/dL (ref 6–23)
CALCIUM: 9.5 mg/dL (ref 8.4–10.5)
CO2: 26 mmol/L (ref 19–32)
Chloride: 86 mmol/L — ABNORMAL LOW (ref 96–112)
Creatinine, Ser: 0.64 mg/dL (ref 0.50–1.10)
GFR calc Af Amer: >90 mL/min (ref >90)
Glucose, Bld: 106 mg/dL — ABNORMAL HIGH (ref 70–99)
Potassium: 3.2 mmol/L — ABNORMAL LOW (ref 3.5–5.1)
Sodium: 125 mmol/L — ABNORMAL LOW (ref 135–145)
TOTAL PROTEIN: 7.2 g/dL (ref 6.0–8.3)
Total Bilirubin: 3.1 mg/dL — ABNORMAL HIGH (ref 0.3–1.2)

## 2015-02-04 LAB — HEPATITIS PANEL, ACUTE
HCV Ab: NEGATIVE
HEP B C IGM: NONREACTIVE
Hep A IgM: NONREACTIVE
Hepatitis B Surface Ag: NEGATIVE

## 2015-02-04 LAB — RAPID URINE DRUG SCREEN, HOSP PERFORMED
Amphetamines: NOT DETECTED
BENZODIAZEPINES: POSITIVE — AB
Barbiturates: NOT DETECTED
Cocaine: NOT DETECTED
OPIATES: NOT DETECTED
Tetrahydrocannabinol: NOT DETECTED

## 2015-02-04 LAB — TROPONIN I: Troponin I: 0.06 ng/mL — ABNORMAL HIGH (ref <0.031)

## 2015-02-04 LAB — CLOSTRIDIUM DIFFICILE BY PCR: CDIFFPCR: NEGATIVE

## 2015-02-04 LAB — SODIUM, URINE, RANDOM

## 2015-02-04 LAB — PREGNANCY, URINE: PREG TEST UR: NEGATIVE

## 2015-02-04 LAB — I-STAT CG4 LACTIC ACID, ED: Lactic Acid, Venous: 2.69 mmol/L (ref 0.5–2.0)

## 2015-02-04 LAB — OSMOLALITY, URINE: Osmolality, Ur: 164 mOsm/kg — ABNORMAL LOW (ref 390–1090)

## 2015-02-04 LAB — ETHANOL: ALCOHOL ETHYL (B): 252 mg/dL — AB (ref 0–9)

## 2015-02-04 LAB — LIPASE, BLOOD: Lipase: 81 U/L — ABNORMAL HIGH (ref 11–59)

## 2015-02-04 LAB — ACETAMINOPHEN LEVEL: Acetaminophen (Tylenol), Serum: <10 ug/mL — ABNORMAL LOW (ref 10–30)

## 2015-02-04 MED ORDER — LORAZEPAM 2 MG/ML IJ SOLN
INTRAMUSCULAR | Status: AC
  Filled 2015-02-04: qty 1

## 2015-02-04 MED ORDER — VITAMIN B-1 100 MG PO TABS
100.0000 mg | ORAL_TABLET | Freq: Every day | ORAL | Status: DC
  Administered 2015-02-04 – 2015-02-06 (×3): 100 mg via ORAL
  Filled 2015-02-04 (×5): qty 1

## 2015-02-04 MED ORDER — SODIUM CHLORIDE 0.9 % IV BOLUS (SEPSIS)
1000.0000 mL | Freq: Once | INTRAVENOUS | Status: AC
  Administered 2015-02-04: 1000 mL via INTRAVENOUS

## 2015-02-04 MED ORDER — THIAMINE HCL 100 MG/ML IJ SOLN
100.0000 mg | Freq: Every day | INTRAMUSCULAR | Status: DC
  Filled 2015-02-04 (×2): qty 1

## 2015-02-04 MED ORDER — DIPHENHYDRAMINE HCL 25 MG PO CAPS
25.0000 mg | ORAL_CAPSULE | Freq: Three times a day (TID) | ORAL | Status: DC | PRN
  Administered 2015-02-04 – 2015-02-06 (×2): 25 mg via ORAL
  Filled 2015-02-04 (×3): qty 1

## 2015-02-04 MED ORDER — HYDRALAZINE HCL 20 MG/ML IJ SOLN: 10.0000 mg | INTRAMUSCULAR | Status: DC | PRN

## 2015-02-04 MED ORDER — PANTOPRAZOLE SODIUM 40 MG IV SOLR
40.0000 mg | INTRAVENOUS | Status: DC
  Filled 2015-02-04 (×3): qty 40

## 2015-02-04 MED ORDER — SODIUM CHLORIDE 0.9 % IJ SOLN
3.0000 mL | Freq: Two times a day (BID) | INTRAMUSCULAR | Status: DC
  Administered 2015-02-05 – 2015-02-06 (×2): 3 mL via INTRAVENOUS

## 2015-02-04 MED ORDER — ADULT MULTIVITAMIN W/MINERALS CH
1.0000 | ORAL_TABLET | Freq: Every day | ORAL | Status: DC
  Administered 2015-02-04 – 2015-02-06 (×3): 1 via ORAL
  Filled 2015-02-04 (×5): qty 1

## 2015-02-04 MED ORDER — MECLIZINE HCL 25 MG PO TABS
25.0000 mg | ORAL_TABLET | Freq: Once | ORAL | Status: AC
Start: 2015-02-04 — End: 2015-02-04
  Administered 2015-02-04: 25 mg via ORAL
  Filled 2015-02-04: qty 1

## 2015-02-04 MED ORDER — LORAZEPAM 2 MG/ML IJ SOLN
0.0000 mg | Freq: Four times a day (QID) | INTRAMUSCULAR | Status: AC
  Administered 2015-02-04 (×2): 2 mg via INTRAVENOUS
  Administered 2015-02-04: 4 mg via INTRAVENOUS
  Administered 2015-02-05 – 2015-02-06 (×3): 2 mg via INTRAVENOUS
  Filled 2015-02-04 (×4): qty 1
  Filled 2015-02-04 (×2): qty 2
  Filled 2015-02-04: qty 1

## 2015-02-04 MED ORDER — ONDANSETRON HCL 4 MG/2ML IJ SOLN
4.0000 mg | Freq: Four times a day (QID) | INTRAMUSCULAR | Status: DC | PRN
  Administered 2015-02-04 – 2015-02-05 (×3): 4 mg via INTRAVENOUS
  Filled 2015-02-04 (×3): qty 2

## 2015-02-04 MED ORDER — FOLIC ACID 1 MG PO TABS
1.0000 mg | ORAL_TABLET | Freq: Every day | ORAL | Status: DC
  Administered 2015-02-04 – 2015-02-06 (×3): 1 mg via ORAL
  Filled 2015-02-04 (×5): qty 1

## 2015-02-04 MED ORDER — SODIUM CHLORIDE 0.9 % IV SOLN
1000.0000 mL | Freq: Once | INTRAVENOUS | Status: AC
  Administered 2015-02-04: 1000 mL via INTRAVENOUS

## 2015-02-04 MED ORDER — LORAZEPAM 2 MG/ML IJ SOLN
1.0000 mg | Freq: Four times a day (QID) | INTRAMUSCULAR | Status: DC | PRN
  Administered 2015-02-04 – 2015-02-06 (×4): 1 mg via INTRAVENOUS
  Filled 2015-02-04 (×4): qty 1

## 2015-02-04 MED ORDER — ONDANSETRON HCL 4 MG/2ML IJ SOLN
4.0000 mg | Freq: Once | INTRAMUSCULAR | Status: AC
  Administered 2015-02-04: 4 mg via INTRAVENOUS
  Filled 2015-02-04: qty 2

## 2015-02-04 MED ORDER — METOCLOPRAMIDE HCL 5 MG/ML IJ SOLN
10.0000 mg | Freq: Once | INTRAMUSCULAR | Status: AC
  Administered 2015-02-04: 10 mg via INTRAVENOUS
  Filled 2015-02-04: qty 2

## 2015-02-04 MED ORDER — LORAZEPAM 1 MG PO TABS: 1.0000 mg | ORAL_TABLET | Freq: Four times a day (QID) | ORAL | Status: DC | PRN

## 2015-02-04 MED ORDER — SODIUM CHLORIDE 0.9 % IV SOLN
1000.0000 mL | INTRAVENOUS | Status: DC
  Administered 2015-02-04: 1000 mL via INTRAVENOUS

## 2015-02-04 MED ORDER — SODIUM CHLORIDE 0.9 % IV SOLN: INTRAVENOUS | Status: DC

## 2015-02-04 MED ORDER — SODIUM CHLORIDE 0.9 % IV SOLN
INTRAVENOUS | Status: DC
  Administered 2015-02-05: 23:00:00 via INTRAVENOUS

## 2015-02-04 MED ORDER — LORAZEPAM 2 MG/ML IJ SOLN
0.0000 mg | Freq: Two times a day (BID) | INTRAMUSCULAR | Status: DC
  Administered 2015-02-06 (×2): 4 mg via INTRAVENOUS
  Filled 2015-02-04 (×2): qty 1
  Filled 2015-02-04: qty 2

## 2015-02-04 MED ORDER — DIPHENHYDRAMINE HCL 50 MG/ML IJ SOLN
25.0000 mg | Freq: Once | INTRAMUSCULAR | Status: AC
  Administered 2015-02-04: 25 mg via INTRAVENOUS
  Filled 2015-02-04: qty 1

## 2015-02-04 NOTE — ED Provider Notes (Signed)
CSN: [id number]     Arrival date & time 02/04/15  0102 History  This chart was scribed for Tiffany Booze, MD by Luisa Dago, ED Scribe. This patient was seen in room A12C/A12C and the patient's care was started at 2:45 AM.    Chief Complaint  Patient presents with  . Abdominal Pain   The history is provided by the patient and medical records. No language interpreter was used.   HPI Comments: Tiffany Howell is a 44 y.o. female with PMhx of HTN and anxiety listed below presents to the Emergency Department with multiple complaints. She is complaining of gradual onset gradually worsening epigastric abdominal pain that started 3 days ago. Also reports dizziness which she describes as a room spinning sensation, onset 6 days ago. Pt states that she has fallen twice secondary to the dizziness. She states that laying down is the only thing that relieves that sensation. The sensation is exacerbated by any sort of movement. Pt endorses associated nausea, SOB, diarrhea, and rectal bleeding. She states that she was recently discharged from [facility] rehab 3 weeks ago and because of that she has not been able to get a prescription for her daily medications. Some of the medications include: Crestor, Lisinopril, Robaxin, and vitamins. Last alcohol beverage was 1 week ago. Pt endorses associated chest pain, which she describes "sharp" in nature. She states that the pain is intermittent and last about 2 seconds and then goes away on its own. Pt is unsure if her symptoms are due to her missing her medications, so she is here for an evaluation. Denies any fever, chills, numbness, weakness, or leg swelling.   Past Medical History  Diagnosis Date  . Hypertension   . Anxiety   . Hypercholesteremia    No past surgical history on file. No family history on file. History  Substance Use Topics  . Smoking status: Never Smoker   . Smokeless tobacco: Not on file  . Alcohol Use: Yes   OB History    No data available      Review of Systems  Constitutional: Negative for fever and chills.  Eyes: Negative for visual disturbance.  Respiratory: Positive for shortness of breath.   Cardiovascular: Positive for chest pain. Negative for leg swelling.  Gastrointestinal: Positive for nausea, abdominal pain and anal bleeding. Negative for constipation and abdominal distention.  Genitourinary: Negative for dysuria, urgency and flank pain.  Musculoskeletal: Negative for joint swelling.  Skin: Negative for rash.  Neurological: Positive for dizziness. Negative for weakness and numbness.  All other systems reviewed and are negative.  Eyes:   Neur onormal finger to nose test.   Allergies  Review of patient's allergies indicates no known allergies.  Home Medications   Prior to Admission medications   Medication Sig Start Date End Date Taking? Authorizing Provider  amoxicillin-clavulanate (AUGMENTIN) 875-125 MG per tablet Take 1 tablet by mouth 2 (two) times daily. Patient not taking: Reported on 01/30/2015 04/21/14   Arman Filter, NP  HYDROcodone-acetaminophen (NORCO/VICODIN) 5-325 MG per tablet Take 1 tablet by mouth every 4 (four) hours as needed for moderate pain. Patient not taking: Reported on 01/30/2015 04/21/14   Arman Filter, NP  ibuprofen (ADVIL,MOTRIN) 800 MG tablet Take 1 tablet (800 mg total) by mouth every 8 (eight) hours as needed. Patient not taking: Reported on 01/30/2015 04/21/14   Arman Filter, NP  lisinopril-hydrochlorothiazide (PRINZIDE,ZESTORETIC) 20-12.5 MG per tablet Take 1 tablet by mouth daily.    Historical Provider, MD  methocarbamol (  ROBAXIN) 500 MG tablet Take 500 mg by mouth 2 (two) times daily as needed for muscle spasms.    Historical Provider, MD  Multiple Vitamin (MULTIVITAMIN WITH MINERALS) TABS tablet Take 1 tablet by mouth daily.    Historical Provider, MD  rosuvastatin (CRESTOR) 5 MG tablet Take 5 mg by mouth daily.    Historical Provider, MD   BP 118/58 mmHg  Pulse 91  Temp(Src)  98.7 F (37.1 C) (Oral)  Resp 22  SpO2 94%   Physical Exam  Constitutional: She is oriented to person, place, and time. She appears well-developed and well-nourished. No distress.  HENT:  Head: Normocephalic and atraumatic.  Eyes: Conjunctivae and EOM are normal. Pupils are equal, round, and reactive to light.  Prominent rotary nystagmus with gaze in all directions. Dizziness reproducible by gaze laterally, superiorly, and inferiorly.   Neck: Normal range of motion. Neck supple. No JVD present.  Cardiovascular: Normal rate, regular rhythm and normal heart sounds.   No murmur heard. Pulmonary/Chest: Effort normal and breath sounds normal. She has no wheezes. She has no rales. She exhibits no tenderness.  Abdominal: Soft. She exhibits no mass. There is no tenderness. There is no guarding.  Bowel sounds are decreased.  Musculoskeletal: Normal range of motion. She exhibits no edema.  Lymphadenopathy:    She has no cervical adenopathy.  Neurological: She is alert and oriented to person, place, and time. No cranial nerve deficit. Coordination normal.  Normal finger to nose test.  Skin: Skin is warm and dry. No rash noted.  Psychiatric: She has a normal mood and affect. Her behavior is normal. Thought content normal.  Nursing note and vitals reviewed.   ED Course  Procedures (including critical care time)  DIAGNOSTIC STUDIES: Oxygen Saturation is 94% on RA, low by my interpretation.    COORDINATION OF CARE: 2:53 AM- Pt advised of plan for treatment and pt agrees.  Labs Review Results for orders placed or performed during the hospital encounter of 02/04/15  Comprehensive metabolic panel  Result Value Ref Range   Sodium 125 (L) 135 - 145 mmol/L   Potassium 3.2 (L) 3.5 - 5.1 mmol/L   Chloride 86 (L) 96 - 112 mmol/L   CO2 26 19 - 32 mmol/L   Glucose, Bld 106 (H) 70 - 99 mg/dL   BUN 9 6 - 23 mg/dL   Creatinine, Ser 1.610.64 0.50 - 1.10 mg/dL   Calcium 9.5 8.4 - 09.610.5 mg/dL   Total  Protein 7.2 6.0 - 8.3 g/dL   Albumin 3.5 3.5 - 5.2 g/dL   AST 045265 (H) 0 - 37 U/L   ALT 88 (H) 0 - 35 U/L   Alkaline Phosphatase 96 39 - 117 U/L   Total Bilirubin 3.1 (H) 0.3 - 1.2 mg/dL   GFR calc non Af Amer >90 >90 mL/min   GFR calc Af Amer >90 >90 mL/min   Anion gap 13 5 - 15  Ethanol  Result Value Ref Range   Alcohol, Ethyl (B) 252 (H) 0 - 9 mg/dL  CBC with Differential  Result Value Ref Range   WBC 7.5 4.0 - 10.5 K/uL   RBC 4.04 3.87 - 5.11 MIL/uL   Hemoglobin 13.1 12.0 - 15.0 g/dL   HCT 40.936.4 81.136.0 - 91.446.0 %   MCV 90.1 78.0 - 100.0 fL   MCH 32.4 26.0 - 34.0 pg   MCHC 36.0 30.0 - 36.0 g/dL   RDW 78.213.9 95.611.5 - 21.315.5 %   Platelets PENDING 150 - 400  K/uL   Neutrophils Relative % 69 43 - 77 %   Neutro Abs 5.2 1.7 - 7.7 K/uL   Lymphocytes Relative 18 12 - 46 %   Lymphs Abs 1.4 0.7 - 4.0 K/uL   Monocytes Relative 12 3 - 12 %   Monocytes Absolute 0.9 0.1 - 1.0 K/uL   Eosinophils Relative 1 0 - 5 %   Eosinophils Absolute 0.1 0.0 - 0.7 K/uL   Basophils Relative 0 0 - 1 %   Basophils Absolute 0.0 0.0 - 0.1 K/uL  I-Stat CG4 Lactic Acid, ED  Result Value Ref Range   Lactic Acid, Venous 2.69 (HH) 0.5 - 2.0 mmol/L   Comment NOTIFIED PHYSICIAN     EKG Interpretation   Date/Time:  Thursday February 04 2015 04:00:56 EST Ventricular Rate:  93 PR Interval:  171 QRS Duration: 101 QT Interval:  369 QTC Calculation: 459 R Axis:   85 Text Interpretation:  Sinus rhythm Probable anteroseptal infarct, old  Minimal ST depression, inferior leads When compared with ECG of 01/31/2015,  No significant change was found Confirmed by [facility]  MD, Myeisha Kruser ([id number]) on  02/04/2015 4:12:09 AM      MDM   Final diagnoses:  Abdominal pain, unspecified abdominal location  Elevated liver enzymes  Dizziness  Orthostatic hypotension  Hyponatremia  Alcohol intoxication, uncomplicated  Elevated lactic acid level    Dizziness which is worrisome for central vertigo. She has very prominent nystagmus.  Multiple other complaints which are difficult to figure out. She is given IV hydration and a dose of meclizine with modest improvement. She started to have significant orthostatic vital signs changes and mildly elevated lactic acid. Liver enzymes show significant worsening from 3 days ago and bilirubin is now over 3. I went back to ask her she had been taking anything with acetaminophen in it and she vehemently denies such. It is possible that this is all related to alcohol since she did have an alcohol level of almost 400 and her last ED visit. She'll be given additional fluids and is sent for ultrasound of the abdomen. Also, hepatitis panel has been sent. Alcohol today is 252. It is certainly possible that all of her liver enzyme changes are alcohol related. When confronted with this, patient admits that she had consumed alcohol just before coming to the ED. Case is discussed with Dr. Toniann Fail [facility] who agrees to admit the patient. She will probably need MRI to make sure she has not had a posterior circulation stroke. Admitting physician is requested CT scan to make sure that there is not an occult head injury and this is ordered.  I personally performed the services described in this documentation, which was scribed in my presence. The recorded information has been reviewed and is accurate.      Tiffany Booze, MD 02/04/15 [phone]

## 2015-02-04 NOTE — Progress Notes (Signed)
I have seen and examined the patient. Patient was just admitted earlier this morning by Dr. Toniann FailKakrakandy. We will continue with treatment plans as outlined by him. A.m. labs have been ordered. Currently is awake alert and not tremulous. She does appear to have a slight maculopapular rash and no right arm-claims that she thinks this is an allergy to the BP cuff that she had while she was in the emergency room. Will manage with as needed Benadryl . We will reassess tomorrow.

## 2015-02-04 NOTE — H&P (Signed)
[facility] History and Physical  Tiffany BoltStephanie C Kokesh JWJ:[id number] DOB: 1971/10/02 DOA: 02/04/2015  Referring physician: ER physician. PCP: Galvin ProfferHAGUE, IMRAN P, MD   Chief Complaint: Abdominal pain and dizziness.  HPI: Tiffany Howell is a 44 y.o. female medical history of hypertension and hyperlipidemia presented not taking medications, chronic alcoholism presents to the ER because of abdominal pain with nausea vomiting and diarrhea and dizziness. Patient states she has been having these symptoms over the last 6 days. Patient states she was recently discharged from alcohol rehabilitation but started drinking alcohol again last few days. In the ER patient was found to be dizzy but only on standing and has horizontal nystagmus. Patient is not orthostatic. Patient was initially mildly tachycardic. Patient's lab work show hyponatremia with mildly elevated lactic acid level in the ER ER physician had given 2 L of normal saline bolus. CT head and sonogram of abdomen were unremarkable. Patient's abdominal pain is mostly in the epigastric area and LFTs are elevated. Patient will be admitted for further management of her dizziness abdominal pain and nausea vomiting and diarrhea. Patient denies taking any recent antibiotics.   Review of Systems: As presented in the history of presenting illness, rest negative.  Past Medical History  Diagnosis Date  . Hypertension   . Anxiety   . Hypercholesteremia    History reviewed. No pertinent past surgical history. Social History:  reports that she has never smoked. She does not have any smokeless tobacco history on file. She reports that she drinks alcohol. Her drug history is not on file. Where does patient live home. Can patient participate in ADLs? Yes.  No Known Allergies  Family History:  Family History  Problem Relation Age of Onset  . Diabetes Mellitus II Mother   . Hypertension Mother   . Heart failure Mother       Prior to Admission  medications   Medication Sig Start Date End Date Taking? Authorizing Provider  bismuth subsalicylate (PEPTO BISMOL) 262 MG chewable tablet Chew 524 mg by mouth as needed for diarrhea or loose stools.   Yes Historical Provider, MD  amoxicillin-clavulanate (AUGMENTIN) 875-125 MG per tablet Take 1 tablet by mouth 2 (two) times daily. Patient not taking: Reported on 01/30/2015 04/21/14   Arman FilterGail K Schulz, NP  HYDROcodone-acetaminophen (NORCO/VICODIN) 5-325 MG per tablet Take 1 tablet by mouth every 4 (four) hours as needed for moderate pain. Patient not taking: Reported on 01/30/2015 04/21/14   Arman FilterGail K Schulz, NP  ibuprofen (ADVIL,MOTRIN) 800 MG tablet Take 1 tablet (800 mg total) by mouth every 8 (eight) hours as needed. Patient not taking: Reported on 01/30/2015 04/21/14   Arman FilterGail K Schulz, NP    Physical Exam: Filed Vitals:   02/04/15 0400 02/04/15 0430 02/04/15 0454 02/04/15 0559  BP: 145/55 112/62  114/68  Pulse: 93 93  87  Temp:   97.3 F (36.3 C)   TempSrc:      Resp: 21 19  16   SpO2: 92% 93%  96%     General:  Not in acute distress.  Eyes: Anicteric no pallor.  ENT: No discharge from the ears eyes nose or mouth.  Neck: No mass felt. No neck rigidity.  Cardiovascular: S1 and S2 heard.  Respiratory: No rhonchi or crepitations.  Abdomen: Soft nontender bowel sounds present no guarding or rigidity.  Skin: No rash.  Musculoskeletal: No edema.  Psychiatric: Appears normal at this time.  Neurologic: Alert awake oriented to time place and person. Moves all extremities.  Labs  on Admission:  Basic Metabolic Panel:  Recent Labs Lab 01/30/15 2341 02/04/15 0322  NA 137 125*  K 3.1* 3.2*  CL 101 86*  CO2 24 26  GLUCOSE 115* 106*  BUN 12 9  CREATININE 0.61 0.64  CALCIUM 8.4 9.5   Liver Function Tests:  Recent Labs Lab 01/30/15 2341 02/04/15 0322  AST 142* 265*  ALT 57* 88*  ALKPHOS 70 96  BILITOT 1.8* 3.1*  PROT 7.3 7.2  ALBUMIN 3.8 3.5   No results for input(s):  LIPASE, AMYLASE in the last 168 hours. No results for input(s): AMMONIA in the last 168 hours. CBC:  Recent Labs Lab 01/30/15 2341 02/04/15 0322  WBC 6.5 7.5  NEUTROABS 3.8 5.2  HGB 14.0 13.1  HCT 40.2 36.4  MCV 95.3 90.1  PLT 84* 101*   Cardiac Enzymes: No results for input(s): CKTOTAL, CKMB, CKMBINDEX, TROPONINI in the last 168 hours.  BNP (last 3 results) No results for input(s): BNP in the last 8760 hours.  ProBNP (last 3 results) No results for input(s): PROBNP in the last 8760 hours.  CBG: No results for input(s): GLUCAP in the last 168 hours.  Radiological Exams on Admission: Ct Head Wo Contrast  02/04/2015   CLINICAL DATA:  Acute onset of dizziness for 6 days. Recent falls. Initial encounter.  EXAM: CT HEAD WITHOUT CONTRAST  TECHNIQUE: Contiguous axial images were obtained from the base of the skull through the vertex without intravenous contrast.  COMPARISON:  CT of the head performed 02/01/2015  FINDINGS: There is no evidence of acute infarction, mass lesion, or intra- or extra-axial hemorrhage on CT.  The posterior fossa, including the cerebellum, brainstem and fourth ventricle, is within normal limits. The third and lateral ventricles, and basal ganglia are unremarkable in appearance. The cerebral hemispheres are symmetric in appearance, with normal gray-white differentiation. No mass effect or midline shift is seen.  There is no evidence of fracture; visualized osseous structures are unremarkable in appearance. The orbits are within normal limits. The paranasal sinuses and mastoid air cells are well-aerated. No significant soft tissue abnormalities are seen. Asymmetry of the parotid glands is thought to reflect a normal variant, as there is no evidence of soft tissue edema.  IMPRESSION: Unremarkable noncontrast CT of the head.   Electronically Signed   By: Roanna Raider M.D.   On: 02/04/2015 05:20   US Abdomen Complete  02/04/2015   CLINICAL DATA:  Acute onset of  generalized abdominal pain. Initial encounter.  EXAM: ULTRASOUND ABDOMEN COMPLETE  COMPARISON:  Right upper quadrant abdominal ultrasound performed 10/09/2008  FINDINGS: Gallbladder: No gallstones or wall thickening visualized. No sonographic Murphy sign noted.  Common bile duct: Diameter: 0.4 cm, within normal limits in caliber.  Liver: No focal lesion identified. Mildly increased parenchymal echogenicity raises concern for fatty infiltration. Partially obscured due to overlying structures.  IVC: No abnormality visualized.  Pancreas: Visualized portion unremarkable.  Spleen: Size and appearance within normal limits.  Right Kidney: Length: 13.1 cm. Echogenicity within normal limits. No mass or hydronephrosis visualized.  Left Kidney: Length: 13.6 cm. Echogenicity within normal limits. No mass or hydronephrosis visualized.  Abdominal aorta: No aneurysm visualized.  Other findings: None.  IMPRESSION: 1. No acute abnormality seen within the abdomen. 2. Mild fatty infiltration within the liver.   Electronically Signed   By: Roanna Raider M.D.   On: 02/04/2015 05:52    EKG: Independently reviewed. Normal sinus rhythm with nonspecific ST-T changes in the inferior leads.  Assessment/Plan Principal Problem:  Nausea vomiting and diarrhea Active Problems:   Elevated lactic acid level   Abdominal pain   Dizziness   Hypertension   Elevated LFTs   Hyperlipidemia   1. Abdominal pain with nausea vomiting and diarrhea - suspect alcoholic gastritis versus pancreatitis. Lipase is pending. Sonogram of abdomen is unremarkable. At this time I have placed patient on a clear liquid diet and Protonix IV. Patient's nausea vomiting and diarrhea could be related to the same and at this time also we will check stool studies. Abdomen appears benign on exam. 2. Dizziness - cause not clear. Patient has horizontal nystagmus on exam. Patient is not orthostatic. Since patient is still dizzy and CD8 is negative MRI brain has been  ordered and get physical therapy consult patient's dizziness may be related to alcoholism. Patient does not have any signs to suggest Wernicke's encephalopathy as patient is alert awake and oriented times place and person. Thiamine level is pending. 3. Alcoholism - patient has been placed on alcohol withdrawal protocol. Patient advised to quit alcoholism patient was just recently discharged from rehabilitation. Consult social work. 4. Hyponatremia with mild hypokalemia - since patient is lacking as it was elevated patient did received 2 L normal saline bolus in the ER. At this time we will hold off any further infusion of fluids until we get repeat metabolic panel and urine studies for sodium and osmolality. A stoma sodium trends we will have further plans. 5. Elevated LFTs - most likely from alcoholism. Acute hepatitis panel is pending. Sonogram of abdomen is unremarkable. Check INR. Closely follow LFTs for any further worsening. Tylenol is negative. 6. Mild thrombocytopenia probably related to alcoholism - closely follow CBC. 7. History of hypertension - patient has not been taking her medications for last 1 month. At this time I have placed patient on when necessary IV hydralazine for systolic blood pressure more than 160. 8. Hyperlipidemia - presently we will hold off any statins as patient's LFTs are elevated.  Chest x-ray is pending.   DVT Prophylaxis SCDs until we get the INR results.  Code Status: Full code.  Family Communication: None.  Disposition Plan: Admit to inpatient.    Quindon Denker N. [facility] Pager [phone].  If 7PM-7AM, please contact night-coverage [email or web address] Password [email or web address] 02/04/2015, 6:47 AM

## 2015-02-04 NOTE — ED Notes (Signed)
Attempted to call report

## 2015-02-04 NOTE — ED Notes (Signed)
[facility] to ED [facility] c/o upper abdominal pain with nausea. [facility].

## 2015-02-04 NOTE — ED Notes (Signed)
Patient now has rash all over, red and raised.  Patient denies rash being present before.  Patient has noted contusions to arms/legs that are related to falls.  Will inform MD

## 2015-02-04 NOTE — ED Provider Notes (Signed)
CSN: [id number]     Arrival date & time 01/30/15  2155 History   First MD Initiated Contact with Patient 01/30/15 2249     Chief Complaint  Patient presents with  . Fall  . Back Pain  . Alcohol Intoxication     (Consider location/radiation/quality/duration/timing/severity/associated sxs/prior Treatment) HPI   [age] year old female with lower back pain. History of chronic lower back pain. She says this is acutely worse after fall this evening. Patient was getting out of bed when she fell. She had not got up to standing position and essentially rolled out of the bed. Worsening pain in her lower back since then. She was able to get up and stand after this. Patient with numerous other complaints but many of them are chronic in nature. Admits to heavy drinking tonight. Long standing history of alcohol abuse.  Past Medical History  Diagnosis Date  . Hypertension   . Anxiety   . Hypercholesteremia    History reviewed. No pertinent past surgical history. Family History  Problem Relation Age of Onset  . Diabetes Mellitus II Mother   . Hypertension Mother   . Heart failure Mother    History  Substance Use Topics  . Smoking status: Never Smoker   . Smokeless tobacco: Not on file  . Alcohol Use: Yes   OB History    No data available     Review of Systems  All systems reviewed and negative, other than as noted in HPI.   Allergies  Review of patient's allergies indicates no known allergies.  Home Medications   Prior to Admission medications   Medication Sig Start Date End Date Taking? Authorizing Provider  amoxicillin-clavulanate (AUGMENTIN) 875-125 MG per tablet Take 1 tablet by mouth 2 (two) times daily. Patient not taking: Reported on 01/30/2015 04/21/14   Arman FilterGail K Schulz, NP  bismuth subsalicylate (PEPTO BISMOL) 262 MG chewable tablet Chew 524 mg by mouth as needed for diarrhea or loose stools.    Historical Provider, MD  HYDROcodone-acetaminophen (NORCO/VICODIN) 5-325 MG per  tablet Take 1 tablet by mouth every 4 (four) hours as needed for moderate pain. Patient not taking: Reported on 01/30/2015 04/21/14   Arman FilterGail K Schulz, NP  ibuprofen (ADVIL,MOTRIN) 800 MG tablet Take 1 tablet (800 mg total) by mouth every 8 (eight) hours as needed. Patient not taking: Reported on 01/30/2015 04/21/14   Arman FilterGail K Schulz, NP   BP 124/78 mmHg  Pulse 108  Temp(Src) 98.8 F (37.1 C) (Oral)  Resp 19  SpO2 94% Physical Exam  Constitutional: She appears well-developed and well-nourished. No distress.  Laying in bed. Awake. Obese. Disheveled appearance.   HENT:  Head: Normocephalic and atraumatic.  Eyes: Conjunctivae are normal. Right eye exhibits no discharge. Left eye exhibits no discharge.  Neck: Neck supple.  Cardiovascular: Normal rate, regular rhythm and normal heart sounds.  Exam reveals no gallop and no friction rub.   No murmur heard. Pulmonary/Chest: Effort normal and breath sounds normal. No respiratory distress.  Abdominal: Soft. She exhibits no distension. There is no tenderness.  Musculoskeletal: She exhibits no edema or tenderness.  Back pain not reproducible. No midline tenderness. Able to change position in bed unassisted. No bony tenderness of extremities or apparent pain with ROM of large joints.   Neurological: She is alert.  General psychomotor slowing consistent with acute alcohol intoxication. Speech is slow/slurred but easily understandable. Content appropriate.Follows commands. CN2-12 intact. Strength 5/5 b/l u/l extrem. Sensation intact to light touch. Slow movement with finger to nose but  no past pointing.   Skin: Skin is warm and dry.  Psychiatric: She has a normal mood and affect. Her behavior is normal. Thought content normal.  Nursing note and vitals reviewed.   ED Course  Procedures (including critical care time) Labs Review Labs Reviewed  ETHANOL - Abnormal; Notable for the following:    Alcohol, Ethyl (B) 391 (*)    All other components within normal  limits  CBC WITH DIFFERENTIAL/PLATELET - Abnormal; Notable for the following:    Platelets 84 (*)    All other components within normal limits  COMPREHENSIVE METABOLIC PANEL - Abnormal; Notable for the following:    Potassium 3.1 (*)    Glucose, Bld 115 (*)    AST 142 (*)    ALT 57 (*)    Total Bilirubin 1.8 (*)    All other components within normal limits  URINALYSIS, ROUTINE W REFLEX MICROSCOPIC - Abnormal; Notable for the following:    Leukocytes, UA TRACE (*)    All other components within normal limits  URINE RAPID DRUG SCREEN (HOSP PERFORMED)  URINE MICROSCOPIC-ADD ON    Imaging Review Ct Head Wo Contrast  02/04/2015   CLINICAL DATA:  Acute onset of dizziness for 6 days. Recent falls. Initial encounter.  EXAM: CT HEAD WITHOUT CONTRAST  TECHNIQUE: Contiguous axial images were obtained from the base of the skull through the vertex without intravenous contrast.  COMPARISON:  CT of the head performed 02/01/2015  FINDINGS: There is no evidence of acute infarction, mass lesion, or intra- or extra-axial hemorrhage on CT.  The posterior fossa, including the cerebellum, brainstem and fourth ventricle, is within normal limits. The third and lateral ventricles, and basal ganglia are unremarkable in appearance. The cerebral hemispheres are symmetric in appearance, with normal gray-white differentiation. No mass effect or midline shift is seen.  There is no evidence of fracture; visualized osseous structures are unremarkable in appearance. The orbits are within normal limits. The paranasal sinuses and mastoid air cells are well-aerated. No significant soft tissue abnormalities are seen. Asymmetry of the parotid glands is thought to reflect a normal variant, as there is no evidence of soft tissue edema.  IMPRESSION: Unremarkable noncontrast CT of the head.   Electronically Signed   By: Roanna Raider M.D.   On: 02/04/2015 05:20   US Abdomen Complete  02/04/2015   CLINICAL DATA:  Acute onset of  generalized abdominal pain. Initial encounter.  EXAM: ULTRASOUND ABDOMEN COMPLETE  COMPARISON:  Right upper quadrant abdominal ultrasound performed 10/09/2008  FINDINGS: Gallbladder: No gallstones or wall thickening visualized. No sonographic Murphy sign noted.  Common bile duct: Diameter: 0.4 cm, within normal limits in caliber.  Liver: No focal lesion identified. Mildly increased parenchymal echogenicity raises concern for fatty infiltration. Partially obscured due to overlying structures.  IVC: No abnormality visualized.  Pancreas: Visualized portion unremarkable.  Spleen: Size and appearance within normal limits.  Right Kidney: Length: 13.1 cm. Echogenicity within normal limits. No mass or hydronephrosis visualized.  Left Kidney: Length: 13.6 cm. Echogenicity within normal limits. No mass or hydronephrosis visualized.  Abdominal aorta: No aneurysm visualized.  Other findings: None.  IMPRESSION: 1. No acute abnormality seen within the abdomen. 2. Mild fatty infiltration within the liver.   Electronically Signed   By: Roanna Raider M.D.   On: 02/04/2015 05:52   Dg Chest Port 1 View  02/04/2015   CLINICAL DATA:  Shortness of breath.  EXAM: PORTABLE CHEST - 1 VIEW  COMPARISON:  None.  FINDINGS: Heart size  and pulmonary vascularity are normal and the lungs are clear. No osseous abnormality. Slight elevation of the right hemidiaphragm which is not felt to be significant.  IMPRESSION: No significant abnormalities.   Electronically Signed   By: Francene Boyers M.D.   On: 02/04/2015 07:34     EKG Interpretation   Date/Time:  Sunday January 31 2015 01:00:30 EST Ventricular Rate:  107 PR Interval:  146 QRS Duration: 90 QT Interval:  356 QTC Calculation: 475 R Axis:   51 Text Interpretation:  Sinus tachycardia Otherwise normal ECG ED PHYSICIAN  INTERPRETATION AVAILABLE IN [facility] Confirmed by TEST, Record  ([id number]) on 02/02/2015 8:04:32 AM      MDM   Final diagnoses:  Alcohol abuse  Low back  pain without sciatica, unspecified back pain laterality    43yF with lower back pain after rolling/falling out of bed. Not sure how much acute versus chronic. Aside from being intoxicated, neuro exam is nonfocal. Very low suspicion for cord injury. Pain medication deferred as she is highly intoxicated and does not seem uncomfortable laying in bed. Imaging w/o acute abnormality. Plan continued observation until clinically sober or has responsible transporation.     Raeford Razor, MD 02/04/15 1218

## 2015-02-04 NOTE — ED Notes (Signed)
Patient transported to CT 

## 2015-02-04 NOTE — ED Notes (Signed)
Patient with reported diarrhea.  Note of stool studies.  Advised floor that enteric precautions should be added.  Receiving RN aware of belongings as well.  Will advise patient of need for stool samples.

## 2015-02-04 NOTE — ED Notes (Signed)
MD notified of red rash.  He will see on unit.  No new orders

## 2015-02-04 NOTE — ED Notes (Signed)
Dr. Kakrakandy ( hospitalist) at bedside evaluating pt.  

## 2015-02-04 NOTE — ED Notes (Signed)
Pt c/o nausea and anxiety.  Will perform CIWA.

## 2015-02-04 NOTE — ED Notes (Signed)
Patient transported to Ultrasound 

## 2015-02-04 NOTE — ED Notes (Signed)
Pt c/o upper abdominal pain x 3 days. [facility]; "but then they smell the alcohol and won't help me." Pt also c/o frontal headache, dizziness, and NVD. Pt tender to upper left quadrants. Reports falling twice at home from the dizziness.

## 2015-02-05 DIAGNOSIS — I10 Essential (primary) hypertension: Secondary | ICD-10-CM

## 2015-02-05 LAB — HEPATIC FUNCTION PANEL
ALT: 94 U/L — ABNORMAL HIGH (ref 0–35)
AST: 272 U/L — ABNORMAL HIGH (ref 0–37)
Albumin: 3.3 g/dL — ABNORMAL LOW (ref 3.5–5.2)
Alkaline Phosphatase: 95 U/L (ref 39–117)
BILIRUBIN INDIRECT: 2.3 mg/dL — AB (ref 0.3–0.9)
BILIRUBIN TOTAL: 3.4 mg/dL — AB (ref 0.3–1.2)
Bilirubin, Direct: 1.1 mg/dL — ABNORMAL HIGH (ref 0.0–0.5)
Total Protein: 7.1 g/dL (ref 6.0–8.3)

## 2015-02-05 LAB — BASIC METABOLIC PANEL
ANION GAP: 7 (ref 5–15)
BUN: 5 mg/dL — ABNORMAL LOW (ref 6–23)
CHLORIDE: 99 mmol/L (ref 96–112)
CO2: 27 mmol/L (ref 19–32)
Calcium: 8.4 mg/dL (ref 8.4–10.5)
Creatinine, Ser: 0.69 mg/dL (ref 0.50–1.10)
GFR calc Af Amer: >90 mL/min (ref >90)
Glucose, Bld: 104 mg/dL — ABNORMAL HIGH (ref 70–99)
Potassium: 3.4 mmol/L — ABNORMAL LOW (ref 3.5–5.1)
Sodium: 133 mmol/L — ABNORMAL LOW (ref 135–145)

## 2015-02-05 MED ORDER — ATENOLOL 25 MG PO TABS
25.0000 mg | ORAL_TABLET | Freq: Every day | ORAL | Status: DC
  Administered 2015-02-05: 25 mg via ORAL
  Filled 2015-02-05 (×3): qty 1

## 2015-02-05 MED ORDER — POTASSIUM CHLORIDE CRYS ER 20 MEQ PO TBCR
40.0000 meq | EXTENDED_RELEASE_TABLET | Freq: Once | ORAL | Status: AC
  Administered 2015-02-05: 40 meq via ORAL
  Filled 2015-02-05: qty 2

## 2015-02-05 MED ORDER — NAPHAZOLINE HCL 0.1 % OP SOLN
2.0000 [drp] | Freq: Four times a day (QID) | OPHTHALMIC | Status: DC | PRN
  Administered 2015-02-05: 2 [drp] via OPHTHALMIC
  Filled 2015-02-05 (×2): qty 15

## 2015-02-05 MED ORDER — WHITE PETROLATUM GEL
Status: AC
  Filled 2015-02-05: qty 1

## 2015-02-05 NOTE — Evaluation (Signed)
Physical Therapy Evaluation Patient Details Name: EMMETT ARNTZ MRN: [id number] DOB: Mar 23, 1971 Today's Date: 02/05/2015   History of Present Illness  Pt. admitted with increased back pain after a fall while intoxicated.  PMH includes obesity, HTN and anxiety  Clinical Impression  Pt. Presents to PT at baseline level of functional mobility.  NO skilled Pt needs identified.  Will sign off.    Follow Up Recommendations No PT follow up    Equipment Recommendations  None recommended by PT    Recommendations for Other Services       Precautions / Restrictions Precautions Precautions: Other (comment) (pt's eyes appear very red, ? pink eye?) Precaution Comments: I informed RN Koleen Nimrod about pt's very red eyes.  Pt denies crying recently and says eyes burn and sting.  Suggest pt. be checked for possible conjunctivitis.  Koleen Nimrod to check on pt and call MD Restrictions Weight Bearing Restrictions: No      Mobility  Bed Mobility Overal bed mobility: Independent             General bed mobility comments: independent   Transfers Overall transfer level: Independent Equipment used: None             General transfer comment: managing without difficulty  Ambulation/Gait Ambulation/Gait assistance: Independent Ambulation Distance (Feet): 500 Feet Assistive device: None Gait Pattern/deviations: WFL(Within Functional Limits)     General Gait Details: pt. with no overt LOB; pt. states she feels her gait is normal .  Denies dizziness.  Denies history of falls except the fall that caused this hospitalization  Stairs Stairs: Yes Stairs assistance: Independent Stair Management: No rails;Forwards Number of Stairs: 5 General stair comments: no difficulties or instabilities noted  Wheelchair Mobility    Modified Rankin (Stroke Patients Only)       Balance Overall balance assessment: No apparent balance deficits (not formally assessed)                                            Pertinent Vitals/Pain Pain Assessment: 0-10 Pain Score: 6  Pain Location: low back and neck Pain Descriptors / Indicators: Aching Pain Intervention(s): Monitored during session;Repositioned (pt. reports she has had pain med)    Home Living Family/patient expects to be discharged to:: Private residence Living Arrangements: Alone Available Help at Discharge: Friend(s);Available PRN/intermittently Type of Home: House Home Access: Stairs to enter Entrance Stairs-Rails: None Entrance Stairs-Number of Steps: 2 Home Layout: One level Home Equipment: Walker - 2 wheels      Prior Function Level of Independence: Independent         Comments: doews not drive and is not employeed     Hand Dominance        Extremity/Trunk Assessment   Upper Extremity Assessment: Overall WFL for tasks assessed           Lower Extremity Assessment: Overall WFL for tasks assessed      Cervical / Trunk Assessment: Normal  Communication   Communication: No difficulties  Cognition Arousal/Alertness: Awake/alert Behavior During Therapy: WFL for tasks assessed/performed Overall Cognitive Status: Within Functional Limits for tasks assessed                      General Comments      Exercises        Assessment/Plan    PT Assessment Patent does not need any further PT  services  PT Diagnosis     PT Problem List    PT Treatment Interventions     PT Goals (Current goals can be found in the Care Plan section) Acute Rehab PT Goals PT Goal Formulation: All assessment and education complete, DC therapy    Frequency     Barriers to discharge        Co-evaluation               End of Session   Activity Tolerance: Patient tolerated treatment well Patient left: Other (comment) (up ad lib in room) Nurse Communication: Mobility status    Functional Assessment Tool Used: clinical observation Functional Limitation: Mobility: Walking and moving  around Mobility: Walking and Moving Around Current Status [phone](G8978): 0 percent impaired, limited or restricted Mobility: Walking and Moving Around Goal Status [phone](G8979): 0 percent impaired, limited or restricted Mobility: Walking and Moving Around Discharge Status [phone](G8980): 0 percent impaired, limited or restricted    Time: 1342-1401 PT Time Calculation (min) (ACUTE ONLY): 19 min   Charges:   PT Evaluation $Initial PT Evaluation Tier I: 1 Procedure     PT G Codes:   PT G-Codes **NOT FOR INPATIENT CLASS** Functional Assessment Tool Used: clinical observation Functional Limitation: Mobility: Walking and moving around Mobility: Walking and Moving Around Current Status ([id number](G8978): 0 percent impaired, limited or restricted Mobility: Walking and Moving Around Goal Status ([id number](G8979): 0 percent impaired, limited or restricted Mobility: Walking and Moving Around Discharge Status ([id number](G8980): 0 percent impaired, limited or restricted    Ferman HammingBlankenship, Svara Twyman B 02/05/2015, 2:24 PM  Weldon PickingSusan Pearson [facility] Acute [facility] Services [phone] [phone]

## 2015-02-05 NOTE — Progress Notes (Signed)
Utilization review completed.  

## 2015-02-05 NOTE — Progress Notes (Signed)
Pt asked RN for eye gtts at 2100 hour. This RN sent pharmacy a request since med not seen upon unit. Pt states day RN already requested medication. This RN states will bring med in to room for pt use as soon as its received. At this time, 2300, pt states frustration w/ eye gtts still not brought to her. This RN wrote another request to pharmacy and spoke w/ pharmacy tech regarding need of medication.t hey stated med was filled on day shift but they will fill again and resend to unit. Pt notified, still upset at this time. RN providing comfort measures. Pt received warm towel to pat eyes with per her request.

## 2015-02-05 NOTE — Progress Notes (Signed)
PATIENT DETAILS Name: Tiffany BoltStephanie C Howell Age: 44 y.o. Sex: female Date of Birth: [birth year] Admit Date: 02/04/2015 Admitting Physician Dewayne ShorterShanker Levora DredgeM Gabbie Marzo, MD ION:[id number]:[name], Myrene GalasIMRAN P, MD  Subjective: No further vomiting, mildly tremulous. But awake and alert. Wants to go home.  Assessment/Plan: Principal Problem:   Nausea vomiting and diarrhea: Resolved with supportive care. Not sure if nausea with vomiting was from mild acute alcoholic pancreatitis or viral illness. Abdomen is very soft. Advanced diet.  Active Problems:   Alcoholic hepatitis: Supportive care. LFTs stable, in fact down trending.    ?Pancreatitis: No abdominal pain. Belly soft. Advance diet. Lipase only minimally elevated.    Alcohol withdrawal: Continue with Ativan for CIWA protocol. Remains mildly tremulous with mild tachycardia, but awake and alert.    Dizziness: Likely secondary to dehydration/orthostatics. This has resolved. MRI brain negative.    Hypertension: Start Atenolol. Follow BP trend    Hyponatremia: Likely multifactorial from dehydration from vomiting and beer potomania. Sodium much better following hydration    Hypokalemia: Likely secondary to GI loss and alcohol use. Replete and recheck    Mild thrombocytopenia: Likely secondary to alcohol. Follow periodically    Hyperlipidemia: Hold off statins given her chronic hepatitis.  Disposition: Remain inpatient  Antibiotics:  See below   Anti-infectives    None     DVT Prophylaxis: SCD's  Code Status: Full code  Family Communication None at bedside  Procedures:  None  CONSULTS:  None  Time spent 40 minutes-which includes 50% of the time with face-to-face with patient/ family and coordinating care related to the above assessment and plan.  MEDICATIONS: Scheduled Meds: . atenolol  25 mg Oral Daily  . folic acid  1 mg Oral Daily  . LORazepam  0-4 mg Intravenous Q6H   Followed by  . [START ON 02/06/2015] LORazepam  0-4 mg  Intravenous Q12H  . multivitamin with minerals  1 tablet Oral Daily  . pantoprazole (PROTONIX) IV  40 mg Intravenous Q24H  . potassium chloride  40 mEq Oral Once  . sodium chloride  3 mL Intravenous Q12H  . thiamine  100 mg Oral Daily   Continuous Infusions: . sodium chloride     PRN Meds:.diphenhydrAMINE, hydrALAZINE, LORazepam **OR** LORazepam, ondansetron (ZOFRAN) IV    PHYSICAL EXAM: Vital signs in last 24 hours: Filed Vitals:   02/04/15 2000 02/04/15 2155 02/05/15 0129 02/05/15 0600  BP: 109/65 134/57 125/70 152/81  Pulse: 85 119 120 121  Temp:  99.6 F (37.6 C) 98.6 F (37 C) 98.8 F (37.1 C)  TempSrc:      Resp:  18 18 18   SpO2:  94% 97% 98%    Weight change:  There were no vitals filed for this visit. There is no height or weight on file to calculate BMI.   Gen Exam: Awake and alert with clear speech.   Neck: Supple, No JVD.   Chest: B/L Clear.   CVS: S1 S2 Regular, no murmurs.  Abdomen: soft, BS +, non tender, non distended.  Extremities: no edema, lower extremities warm to touch. Neurologic: Non Focal.  Skin: No Rash.   Wounds: N/A.    Intake/Output from previous day:  Intake/Output Summary (Last 24 hours) at 02/05/15 1304 Last data filed at 02/04/15 1900  Gross per 24 hour  Intake    360 ml  Output      0 ml  Net    360 ml     LAB RESULTS: CBC  Recent Labs Lab 01/30/15 2341 02/04/15 0322  WBC 6.5 7.5  HGB 14.0 13.1  HCT 40.2 36.4  PLT 84* 101*  MCV 95.3 90.1  MCH 33.2 32.4  MCHC 34.8 36.0  RDW 13.9 13.9  LYMPHSABS 2.0 1.4  MONOABS 0.6 0.9  EOSABS 0.1 0.1  BASOSABS 0.0 0.0    Chemistries   Recent Labs Lab 01/30/15 2341 02/04/15 0322 02/04/15 1258 02/05/15 0655  NA 137 125* 129* 133*  K 3.1* 3.2* 3.2* 3.4*  CL 101 86* 92* 99  CO2 GLUCOSE 115* 106* 125* 104*  BUN 5*  CREATININE 0.61 0.64 0.66 0.69  CALCIUM 8.4 9.5 8.4 8.4    CBG: No results for input(s): GLUCAP in the last 168 hours.  GFR CrCl  cannot be calculated (Unknown ideal weight.).  Coagulation profile  Recent Labs Lab 02/04/15 1258  INR 1.21    Cardiac Enzymes  Recent Labs Lab 02/04/15 1258  TROPONINI 0.06*    Invalid input(s): POCBNP No results for input(s): DDIMER in the last 72 hours. No results for input(s): HGBA1C in the last 72 hours. No results for input(s): CHOL, HDL, LDLCALC, TRIG, CHOLHDL, LDLDIRECT in the last 72 hours. No results for input(s): TSH, T4TOTAL, T3FREE, THYROIDAB in the last 72 hours.  Invalid input(s): FREET3 No results for input(s): VITAMINB12, FOLATE, FERRITIN, TIBC, IRON, RETICCTPCT in the last 72 hours.  Recent Labs  02/04/15 1258  LIPASE 81*    Urine Studies No results for input(s): UHGB, CRYS in the last 72 hours.  Invalid input(s): UACOL, UAPR, USPG, UPH, UTP, UGL, UKET, UBIL, UNIT, UROB, ULEU, UEPI, UWBC, URBC, UBAC, CAST, UCOM, BILUA  MICROBIOLOGY: Recent Results (from the past 240 hour(s))  Clostridium Difficile by PCR     Status: None   Collection Time: 02/04/15 10:38 AM  Result Value Ref Range Status   C difficile by pcr NEGATIVE NEGATIVE Final    RADIOLOGY STUDIES/RESULTS: Dg Lumbar Spine Complete  01/31/2015   CLINICAL DATA:  Lumbar spine pain after fall. Alcohol intoxication. History of bulging discs.  EXAM: LUMBAR SPINE - COMPLETE 4+ VIEW  COMPARISON:  Lumbar spine CT 07/08/2012, radiograph 06/25/2012  FINDINGS: No acute fracture. No compression deformity. There is multifocal disc space narrowing at L4-L5, L2-L3, and T11-T12. Associated endplate spurring. Minimal anterolisthesis of L3 on L4 is unchanged from prior exam. Mild facet arthropathy from L2-L3 through L5-S1, mildly progressed in the lower lumbar spine from prior exam. The sacroiliac joints are symmetric. An intrauterine device is in the pelvis.  IMPRESSION: 1. No acute fracture or subluxation of the lumbar spine. 2. Multilevel degenerative disc disease and facet arthropathy. The facet arthropathy has  mildly progressed from prior exam.   Electronically Signed   By: Rubye Oaks M.D.   On: 01/31/2015 00:30   Ct Head Wo Contrast  02/04/2015   CLINICAL DATA:  Acute onset of dizziness for 6 days. Recent falls. Initial encounter.  EXAM: CT HEAD WITHOUT CONTRAST  TECHNIQUE: Contiguous axial images were obtained from the base of the skull through the vertex without intravenous contrast.  COMPARISON:  CT of the head performed 02/01/2015  FINDINGS: There is no evidence of acute infarction, mass lesion, or intra- or extra-axial hemorrhage on CT.  The posterior fossa, including the cerebellum, brainstem and fourth ventricle, is within normal limits. The third and lateral ventricles, and basal ganglia are unremarkable in appearance. The cerebral hemispheres are symmetric in appearance, with normal gray-white differentiation. No mass effect  or midline shift is seen.  There is no evidence of fracture; visualized osseous structures are unremarkable in appearance. The orbits are within normal limits. The paranasal sinuses and mastoid air cells are well-aerated. No significant soft tissue abnormalities are seen. Asymmetry of the parotid glands is thought to reflect a normal variant, as there is no evidence of soft tissue edema.  IMPRESSION: Unremarkable noncontrast CT of the head.   Electronically Signed   By: Roanna Raider M.D.   On: 02/04/2015 05:20   Mri Brain Without Contrast  02/05/2015   CLINICAL DATA:  Dizziness and nystagmus, nausea, vomiting, diarrhea for 6 days. History of alcohol abuse, hypertension and hypercholesterolemia.  EXAM: MRI HEAD WITHOUT CONTRAST  TECHNIQUE: Multiplanar, multiecho pulse sequences of the brain and surrounding structures were obtained without intravenous contrast.  COMPARISON:  CT of the head February 04, 2015 at 5:09 a.m.  FINDINGS: Mildly motion degraded examination.  The ventricles and sulci are normal. No abnormal parenchymal signal, mass lesions or mass of affect. No reduced  diffusion to suggest acute ischemia. No susceptibility artifact to suggest hemorrhage though the gradient sequences moderately motion degraded.  No abnormal extra-axial fluid collection. No abnormal calvarial bone marrow signal. Ocular globes and orbital contents are unremarkable though not tailored for evaluation. Visualized paranasal sinuses and mastoid air cells appear well-aerated. No abnormal sellar expansion. Craniocervical junction is maintained.  IMPRESSION: Mildly motion degraded examination without acute intracranial process. Normal noncontrast MRI of the brain.   Electronically Signed   By: Awilda Metro   On: 02/05/2015 00:45   US Abdomen Complete  02/04/2015   CLINICAL DATA:  Acute onset of generalized abdominal pain. Initial encounter.  EXAM: ULTRASOUND ABDOMEN COMPLETE  COMPARISON:  Right upper quadrant abdominal ultrasound performed 10/09/2008  FINDINGS: Gallbladder: No gallstones or wall thickening visualized. No sonographic Murphy sign noted.  Common bile duct: Diameter: 0.4 cm, within normal limits in caliber.  Liver: No focal lesion identified. Mildly increased parenchymal echogenicity raises concern for fatty infiltration. Partially obscured due to overlying structures.  IVC: No abnormality visualized.  Pancreas: Visualized portion unremarkable.  Spleen: Size and appearance within normal limits.  Right Kidney: Length: 13.1 cm. Echogenicity within normal limits. No mass or hydronephrosis visualized.  Left Kidney: Length: 13.6 cm. Echogenicity within normal limits. No mass or hydronephrosis visualized.  Abdominal aorta: No aneurysm visualized.  Other findings: None.  IMPRESSION: 1. No acute abnormality seen within the abdomen. 2. Mild fatty infiltration within the liver.   Electronically Signed   By: Roanna Raider M.D.   On: 02/04/2015 05:52   Dg Chest Port 1 View  02/04/2015   CLINICAL DATA:  Shortness of breath.  EXAM: PORTABLE CHEST - 1 VIEW  COMPARISON:  None.  FINDINGS: Heart size  and pulmonary vascularity are normal and the lungs are clear. No osseous abnormality. Slight elevation of the right hemidiaphragm which is not felt to be significant.  IMPRESSION: No significant abnormalities.   Electronically Signed   By: Francene Boyers M.D.   On: 02/04/2015 07:34    Jeoffrey Massed, MD  [facility] Pager:[phone]  If 7PM-7AM, please contact night-coverage [email or web address] Password [email or web address] 02/05/2015, 1:04 PM   LOS: 1 day

## 2015-02-06 LAB — HEPATIC FUNCTION PANEL
ALK PHOS: 93 U/L (ref 39–117)
ALT: 84 U/L — AB (ref 0–35)
AST: 223 U/L — ABNORMAL HIGH (ref 0–37)
Albumin: 3.2 g/dL — ABNORMAL LOW (ref 3.5–5.2)
BILIRUBIN TOTAL: 2 mg/dL — AB (ref 0.3–1.2)
Bilirubin, Direct: 0.6 mg/dL — ABNORMAL HIGH (ref 0.0–0.5)
Indirect Bilirubin: 1.4 mg/dL — ABNORMAL HIGH (ref 0.3–0.9)
TOTAL PROTEIN: 6.5 g/dL (ref 6.0–8.3)

## 2015-02-06 LAB — MAGNESIUM: MAGNESIUM: 1.8 mg/dL (ref 1.5–2.5)

## 2015-02-06 LAB — BASIC METABOLIC PANEL
Anion gap: 7 (ref 5–15)
BUN: <5 mg/dL — ABNORMAL LOW (ref 6–23)
CO2: 27 mmol/L (ref 19–32)
Calcium: 8.1 mg/dL — ABNORMAL LOW (ref 8.4–10.5)
Chloride: 99 mmol/L (ref 96–112)
Creatinine, Ser: 0.74 mg/dL (ref 0.50–1.10)
GFR calc Af Amer: >90 mL/min (ref >90)
GFR calc non Af Amer: >90 mL/min (ref >90)
Glucose, Bld: 128 mg/dL — ABNORMAL HIGH (ref 70–99)
Potassium: 3.4 mmol/L — ABNORMAL LOW (ref 3.5–5.1)
SODIUM: 133 mmol/L — AB (ref 135–145)

## 2015-02-06 LAB — CBC
HCT: 35.2 % — ABNORMAL LOW (ref 36.0–46.0)
Hemoglobin: 11.7 g/dL — ABNORMAL LOW (ref 12.0–15.0)
MCH: 32.2 pg (ref 26.0–34.0)
MCHC: 33.2 g/dL (ref 30.0–36.0)
MCV: 97 fL (ref 78.0–100.0)
Platelets: 53 10*3/uL — ABNORMAL LOW (ref 150–400)
RBC: 3.63 MIL/uL — ABNORMAL LOW (ref 3.87–5.11)
RDW: 14.9 % (ref 11.5–15.5)
WBC: 4.4 10*3/uL (ref 4.0–10.5)

## 2015-02-06 MED ORDER — PANTOPRAZOLE SODIUM 40 MG PO TBEC
40.0000 mg | DELAYED_RELEASE_TABLET | Freq: Every day | ORAL | Status: DC
  Administered 2015-02-06: 40 mg via ORAL

## 2015-02-06 MED ORDER — SALINE SPRAY 0.65 % NA SOLN
1.0000 | NASAL | Status: DC | PRN
Start: 2015-02-06 — End: 2015-02-07
  Administered 2015-02-06: 1 via NASAL
  Filled 2015-02-06: qty 44

## 2015-02-06 MED ORDER — POTASSIUM CHLORIDE CRYS ER 20 MEQ PO TBCR
40.0000 meq | EXTENDED_RELEASE_TABLET | Freq: Once | ORAL | Status: AC
  Administered 2015-02-06: 40 meq via ORAL
  Filled 2015-02-06: qty 2

## 2015-02-06 NOTE — Progress Notes (Signed)
PATIENT DETAILS Name: Tiffany BoltStephanie C Monarrez Age: 44 y.o. Sex: female Date of Birth: 1971/04/03 Admit Date: 02/04/2015 Admitting Physician Dewayne ShorterShanker Levora DredgeM Meriah Shands, MD ZOX:[id number]:[name], Myrene GalasIMRAN P, MD  Subjective: No vomiting, apparently was somewhat confused last night, required 4 mg of Ativan this morning.  Assessment/Plan: Principal Problem:   Nausea vomiting and diarrhea: Resolved with supportive care. Not sure if nausea with vomiting was from mild acute alcoholic pancreatitis or viral illness. Abdomen is very soft. Advanced diet now tolerating regular diet.  Active Problems:   Alcoholic hepatitis: Supportive care. LFTs stable, in fact down trending. Counseled regarding importance of him. He abstaining from alcohol.    ?Pancreatitis: No abdominal pain. Belly soft. Lipase only minimally elevated. Advanced diet now tolerating regular diet.    Alcohol withdrawal: Continue with Ativan for CIWA protocol. Remains mildly tremulous, though awake and alert this morning, probably confused last night. Continue to monitor, suspect she needs another 24 hours before she would be ready for discharge..    Dizziness: Likely secondary to dehydration/orthostatics. This has resolved. MRI brain negative.    Hypertension: Controlled with Atenolol. Follow BP trend    Hyponatremia: Likely multifactorial from dehydration from vomiting and beer potomania. Sodium much better following hydration    Hypokalemia: Likely secondary to GI loss and alcohol use. Replete and recheck    Mild thrombocytopenia: Likely secondary to alcohol. Follow periodically    Hyperlipidemia: Hold off statins given her chronic hepatitis.  Disposition: Remain inpatient  Antibiotics:  See below   Anti-infectives    None     DVT Prophylaxis: SCD's  Code Status: Full code  Family Communication None at bedside  Procedures:  None  CONSULTS:  None  MEDICATIONS: Scheduled Meds: . atenolol  25 mg Oral Daily  . folic acid   1 mg Oral Daily  . LORazepam  0-4 mg Intravenous Q12H  . multivitamin with minerals  1 tablet Oral Daily  . pantoprazole  40 mg Oral Q1200  . potassium chloride  40 mEq Oral Once  . sodium chloride  3 mL Intravenous Q12H  . thiamine  100 mg Oral Daily   Continuous Infusions: . sodium chloride 75 mL/hr at 02/05/15 2329   PRN Meds:.diphenhydrAMINE, hydrALAZINE, LORazepam **OR** LORazepam, naphazoline, ondansetron (ZOFRAN) IV    PHYSICAL EXAM: Vital signs in last 24 hours: Filed Vitals:   02/05/15 2001 02/06/15 0100 02/06/15 0517 02/06/15 0612  BP: 107/50 107/67 121/63 95/57  Pulse: 66 88 92 70  Temp: 98.8 F (37.1 C) 98.6 F (37 C) 98.9 F (37.2 C) 99.1 F (37.3 C)  TempSrc: Oral Oral Oral Oral  Resp: 20 20 20 18   SpO2: 99% 98% 97% 96%    Weight change:  There were no vitals filed for this visit. There is no height or weight on file to calculate BMI.   Gen Exam: Awake and alert with clear speech.  Mildly tremulous. Neck: Supple, No JVD.   Chest: B/L Clear. No rhonchi or rales.  CVS: S1 S2 Regular, no murmurs.  Abdomen: soft, BS +, non tender, non distended.  Extremities: no edema, lower extremities warm to touch. Neurologic: Non Focal.  Skin: No Rash.   Wounds: N/A.    Intake/Output from previous day:  Intake/Output Summary (Last 24 hours) at 02/06/15 1436 Last data filed at 02/06/15 1000  Gross per 24 hour  Intake 2198.75 ml  Output      0 ml  Net 2198.75 ml     LAB RESULTS:  CBC  Recent Labs Lab 01/30/15 2341 02/04/15 0322 02/06/15 0535  WBC 6.5 7.5 4.4  HGB 14.0 13.1 11.7*  HCT 40.2 36.4 35.2*  PLT 84* 101* 53*  MCV 95.3 90.1 97.0  MCH 33.2 32.4 32.2  MCHC 34.8 36.0 33.2  RDW 13.9 13.9 14.9  LYMPHSABS 2.0 1.4  --   MONOABS 0.6 0.9  --   EOSABS 0.1 0.1  --   BASOSABS 0.0 0.0  --     Chemistries   Recent Labs Lab 01/30/15 2341 02/04/15 0322 02/04/15 1258 02/05/15 0655 02/06/15 0535  NA 137 125* 129* 133* 133*  K 3.1* 3.2* 3.2* 3.4*  3.4*  CL 101 86* 92* 99 99  CO2 GLUCOSE 115* 106* 125* 104* 128*  BUN 5* <5*  CREATININE 0.61 0.64 0.66 0.69 0.74  CALCIUM 8.4 9.5 8.4 8.4 8.1*  MG  --   --   --   --  1.8    CBG: No results for input(s): GLUCAP in the last 168 hours.  GFR CrCl cannot be calculated (Unknown ideal weight.).  Coagulation profile  Recent Labs Lab 02/04/15 1258  INR 1.21    Cardiac Enzymes  Recent Labs Lab 02/04/15 1258  TROPONINI 0.06*    Invalid input(s): POCBNP No results for input(s): DDIMER in the last 72 hours. No results for input(s): HGBA1C in the last 72 hours. No results for input(s): CHOL, HDL, LDLCALC, TRIG, CHOLHDL, LDLDIRECT in the last 72 hours. No results for input(s): TSH, T4TOTAL, T3FREE, THYROIDAB in the last 72 hours.  Invalid input(s): FREET3 No results for input(s): VITAMINB12, FOLATE, FERRITIN, TIBC, IRON, RETICCTPCT in the last 72 hours.  Recent Labs  02/04/15 1258  LIPASE 81*    Urine Studies No results for input(s): UHGB, CRYS in the last 72 hours.  Invalid input(s): UACOL, UAPR, USPG, UPH, UTP, UGL, UKET, UBIL, UNIT, UROB, ULEU, UEPI, UWBC, URBC, UBAC, CAST, UCOM, BILUA  MICROBIOLOGY: Recent Results (from the past 240 hour(s))  Clostridium Difficile by PCR     Status: None   Collection Time: 02/04/15 10:38 AM  Result Value Ref Range Status   C difficile by pcr NEGATIVE NEGATIVE Final    RADIOLOGY STUDIES/RESULTS: Dg Lumbar Spine Complete  01/31/2015   CLINICAL DATA:  Lumbar spine pain after fall. Alcohol intoxication. History of bulging discs.  EXAM: LUMBAR SPINE - COMPLETE 4+ VIEW  COMPARISON:  Lumbar spine CT 07/08/2012, radiograph 06/25/2012  FINDINGS: No acute fracture. No compression deformity. There is multifocal disc space narrowing at L4-L5, L2-L3, and T11-T12. Associated endplate spurring. Minimal anterolisthesis of L3 on L4 is unchanged from prior exam. Mild facet arthropathy from L2-L3 through L5-S1, mildly  progressed in the lower lumbar spine from prior exam. The sacroiliac joints are symmetric. An intrauterine device is in the pelvis.  IMPRESSION: 1. No acute fracture or subluxation of the lumbar spine. 2. Multilevel degenerative disc disease and facet arthropathy. The facet arthropathy has mildly progressed from prior exam.   Electronically Signed   By: Rubye Oaks M.D.   On: 01/31/2015 00:30   Ct Head Wo Contrast  02/04/2015   CLINICAL DATA:  Acute onset of dizziness for 6 days. Recent falls. Initial encounter.  EXAM: CT HEAD WITHOUT CONTRAST  TECHNIQUE: Contiguous axial images were obtained from the base of the skull through the vertex without intravenous contrast.  COMPARISON:  CT of the head performed 02/01/2015  FINDINGS: There is no evidence of acute infarction, mass  lesion, or intra- or extra-axial hemorrhage on CT.  The posterior fossa, including the cerebellum, brainstem and fourth ventricle, is within normal limits. The third and lateral ventricles, and basal ganglia are unremarkable in appearance. The cerebral hemispheres are symmetric in appearance, with normal gray-white differentiation. No mass effect or midline shift is seen.  There is no evidence of fracture; visualized osseous structures are unremarkable in appearance. The orbits are within normal limits. The paranasal sinuses and mastoid air cells are well-aerated. No significant soft tissue abnormalities are seen. Asymmetry of the parotid glands is thought to reflect a normal variant, as there is no evidence of soft tissue edema.  IMPRESSION: Unremarkable noncontrast CT of the head.   Electronically Signed   By: Roanna Raider M.D.   On: 02/04/2015 05:20   Mri Brain Without Contrast  02/05/2015   CLINICAL DATA:  Dizziness and nystagmus, nausea, vomiting, diarrhea for 6 days. History of alcohol abuse, hypertension and hypercholesterolemia.  EXAM: MRI HEAD WITHOUT CONTRAST  TECHNIQUE: Multiplanar, multiecho pulse sequences of the brain  and surrounding structures were obtained without intravenous contrast.  COMPARISON:  CT of the head February 04, 2015 at 5:09 a.m.  FINDINGS: Mildly motion degraded examination.  The ventricles and sulci are normal. No abnormal parenchymal signal, mass lesions or mass of affect. No reduced diffusion to suggest acute ischemia. No susceptibility artifact to suggest hemorrhage though the gradient sequences moderately motion degraded.  No abnormal extra-axial fluid collection. No abnormal calvarial bone marrow signal. Ocular globes and orbital contents are unremarkable though not tailored for evaluation. Visualized paranasal sinuses and mastoid air cells appear well-aerated. No abnormal sellar expansion. Craniocervical junction is maintained.  IMPRESSION: Mildly motion degraded examination without acute intracranial process. Normal noncontrast MRI of the brain.   Electronically Signed   By: Awilda Metro   On: 02/05/2015 00:45   US Abdomen Complete  02/04/2015   CLINICAL DATA:  Acute onset of generalized abdominal pain. Initial encounter.  EXAM: ULTRASOUND ABDOMEN COMPLETE  COMPARISON:  Right upper quadrant abdominal ultrasound performed 10/09/2008  FINDINGS: Gallbladder: No gallstones or wall thickening visualized. No sonographic Murphy sign noted.  Common bile duct: Diameter: 0.4 cm, within normal limits in caliber.  Liver: No focal lesion identified. Mildly increased parenchymal echogenicity raises concern for fatty infiltration. Partially obscured due to overlying structures.  IVC: No abnormality visualized.  Pancreas: Visualized portion unremarkable.  Spleen: Size and appearance within normal limits.  Right Kidney: Length: 13.1 cm. Echogenicity within normal limits. No mass or hydronephrosis visualized.  Left Kidney: Length: 13.6 cm. Echogenicity within normal limits. No mass or hydronephrosis visualized.  Abdominal aorta: No aneurysm visualized.  Other findings: None.  IMPRESSION: 1. No acute abnormality  seen within the abdomen. 2. Mild fatty infiltration within the liver.   Electronically Signed   By: Roanna Raider M.D.   On: 02/04/2015 05:52   Dg Chest Port 1 View  02/04/2015   CLINICAL DATA:  Shortness of breath.  EXAM: PORTABLE CHEST - 1 VIEW  COMPARISON:  None.  FINDINGS: Heart size and pulmonary vascularity are normal and the lungs are clear. No osseous abnormality. Slight elevation of the right hemidiaphragm which is not felt to be significant.  IMPRESSION: No significant abnormalities.   Electronically Signed   By: Francene Boyers M.D.   On: 02/04/2015 07:34    Jeoffrey Massed, MD  [facility] Pager:[phone]  If 7PM-7AM, please contact night-coverage [email or web address] Password [email or web address] 02/06/2015, 2:36 PM   LOS: 2 days

## 2015-02-07 DIAGNOSIS — K7010 Alcoholic hepatitis without ascites: Secondary | ICD-10-CM

## 2015-02-07 LAB — COMPREHENSIVE METABOLIC PANEL
ALK PHOS: 90 U/L (ref 39–117)
ALT: 76 U/L — AB (ref 0–35)
AST: 159 U/L — AB (ref 0–37)
Albumin: 2.8 g/dL — ABNORMAL LOW (ref 3.5–5.2)
Anion gap: 8 (ref 5–15)
BILIRUBIN TOTAL: 1.5 mg/dL — AB (ref 0.3–1.2)
CO2: 24 mmol/L (ref 19–32)
CREATININE: 0.6 mg/dL (ref 0.50–1.10)
Calcium: 8.6 mg/dL (ref 8.4–10.5)
Chloride: 105 mmol/L (ref 96–112)
GFR calc Af Amer: >90 mL/min (ref >90)
GFR calc non Af Amer: >90 mL/min (ref >90)
GLUCOSE: 108 mg/dL — AB (ref 70–99)
Potassium: 3.6 mmol/L (ref 3.5–5.1)
SODIUM: 137 mmol/L (ref 135–145)
Total Protein: 6.5 g/dL (ref 6.0–8.3)

## 2015-02-07 MED ORDER — FOLIC ACID 1 MG PO TABS: 1.0000 mg | ORAL_TABLET | Freq: Every day | ORAL | Status: DC

## 2015-02-07 MED ORDER — NAPHAZOLINE HCL 0.1 % OP SOLN: 2.0000 [drp] | Freq: Four times a day (QID) | OPHTHALMIC | Status: DC | PRN

## 2015-02-07 MED ORDER — IBUPROFEN 800 MG PO TABS
800.0000 mg | ORAL_TABLET | Freq: Once | ORAL | Status: AC
Start: 2015-02-07 — End: 2015-02-07
  Administered 2015-02-07: 800 mg via ORAL
  Filled 2015-02-07: qty 1
  Filled 2015-02-07: qty 4

## 2015-02-07 MED ORDER — ADULT MULTIVITAMIN W/MINERALS CH: 1.0000 | ORAL_TABLET | Freq: Every day | ORAL | Status: DC

## 2015-02-07 MED ORDER — ATENOLOL 25 MG PO TABS: 25.0000 mg | ORAL_TABLET | Freq: Every day | ORAL | Status: DC

## 2015-02-07 MED ORDER — THIAMINE HCL 100 MG PO TABS: 100.0000 mg | ORAL_TABLET | Freq: Every day | ORAL | Status: DC

## 2015-02-07 MED ORDER — LORAZEPAM 1 MG PO TABS: ORAL_TABLET | ORAL | Status: DC

## 2015-02-07 NOTE — Discharge Summary (Signed)
PATIENT DETAILS Name: Tiffany Howell Age: 44 y.o. Sex: female Date of Birth: [birth year] MRN: [id number]. Admitting Physician: Maretta Bees, MD WUJ:[id number], [name], MD  Admit Date: 02/04/2015 Discharge date: 02/07/2015  Recommendations for Outpatient Follow-up:  1. Repeat CBC and LFT at next appointment with PCP 2. Counsel against further ETOH use  PRIMARY DISCHARGE DIAGNOSIS:  Principal Problem:   Nausea vomiting and diarrhea Active Problems:   Elevated lactic acid level   Abdominal pain   Dizziness   Hypertension   Elevated LFTs   Hyperlipidemia      PAST MEDICAL HISTORY: Past Medical History  Diagnosis Date  . Hypertension   . Anxiety   . Hypercholesteremia     DISCHARGE MEDICATIONS: Current Discharge Medication List    START taking these medications   Details  atenolol (TENORMIN) 25 MG tablet Take 1 tablet (25 mg total) by mouth daily. Qty: 30 tablet, Refills: 0    folic acid (FOLVITE) 1 MG tablet Take 1 tablet (1 mg total) by mouth daily. Qty: 30 tablet, Refills: 0    LORazepam (ATIVAN) 1 MG tablet Take 1 mg po twice daily for 2 day, then, Take 1 mg po daily for 1 day, then, Take 1 mg po daily as needed Qty: 10 tablet, Refills: 0    Multiple Vitamin (MULTIVITAMIN WITH MINERALS) TABS tablet Take 1 tablet by mouth daily. Qty: 30 tablet, Refills: 0    naphazoline (NAPHCON) 0.1 % ophthalmic solution Place 2 drops into both eyes 4 (four) times daily as needed for irritation or allergies. Qty: 15 mL, Refills: 0    thiamine 100 MG tablet Take 1 tablet (100 mg total) by mouth daily. Qty: 30 tablet, Refills: 0      STOP taking these medications     bismuth subsalicylate (PEPTO BISMOL) 262 MG chewable tablet      amoxicillin-clavulanate (AUGMENTIN) 875-125 MG per tablet      HYDROcodone-acetaminophen (NORCO/VICODIN) 5-325 MG per tablet      ibuprofen (ADVIL,MOTRIN) 800 MG tablet         ALLERGIES:  No Known Allergies  BRIEF HPI:  See  H&P, Labs, Consult and Test reports for all details in brief, patient was admitted for evaluation of vomiting.  CONSULTATIONS:   None  PERTINENT RADIOLOGIC STUDIES: Dg Lumbar Spine Complete  01/31/2015   CLINICAL DATA:  Lumbar spine pain after fall. Alcohol intoxication. History of bulging discs.  EXAM: LUMBAR SPINE - COMPLETE 4+ VIEW  COMPARISON:  Lumbar spine CT 07/08/2012, radiograph 06/25/2012  FINDINGS: No acute fracture. No compression deformity. There is multifocal disc space narrowing at L4-L5, L2-L3, and T11-T12. Associated endplate spurring. Minimal anterolisthesis of L3 on L4 is unchanged from prior exam. Mild facet arthropathy from L2-L3 through L5-S1, mildly progressed in the lower lumbar spine from prior exam. The sacroiliac joints are symmetric. An intrauterine device is in the pelvis.  IMPRESSION: 1. No acute fracture or subluxation of the lumbar spine. 2. Multilevel degenerative disc disease and facet arthropathy. The facet arthropathy has mildly progressed from prior exam.   Electronically Signed   By: Rubye Oaks M.D.   On: 01/31/2015 00:30   Ct Head Wo Contrast  02/04/2015   CLINICAL DATA:  Acute onset of dizziness for 6 days. Recent falls. Initial encounter.  EXAM: CT HEAD WITHOUT CONTRAST  TECHNIQUE: Contiguous axial images were obtained from the base of the skull through the vertex without intravenous contrast.  COMPARISON:  CT of the head performed 02/01/2015  FINDINGS: There  is no evidence of acute infarction, mass lesion, or intra- or extra-axial hemorrhage on CT.  The posterior fossa, including the cerebellum, brainstem and fourth ventricle, is within normal limits. The third and lateral ventricles, and basal ganglia are unremarkable in appearance. The cerebral hemispheres are symmetric in appearance, with normal gray-white differentiation. No mass effect or midline shift is seen.  There is no evidence of fracture; visualized osseous structures are unremarkable in appearance.  The orbits are within normal limits. The paranasal sinuses and mastoid air cells are well-aerated. No significant soft tissue abnormalities are seen. Asymmetry of the parotid glands is thought to reflect a normal variant, as there is no evidence of soft tissue edema.  IMPRESSION: Unremarkable noncontrast CT of the head.   Electronically Signed   By: Roanna RaiderJeffery  Chang M.D.   On: 02/04/2015 05:20   Mri Brain Without Contrast  02/05/2015   CLINICAL DATA:  Dizziness and nystagmus, nausea, vomiting, diarrhea for 6 days. History of alcohol abuse, hypertension and hypercholesterolemia.  EXAM: MRI HEAD WITHOUT CONTRAST  TECHNIQUE: Multiplanar, multiecho pulse sequences of the brain and surrounding structures were obtained without intravenous contrast.  COMPARISON:  CT of the head February 04, 2015 at 5:09 a.m.  FINDINGS: Mildly motion degraded examination.  The ventricles and sulci are normal. No abnormal parenchymal signal, mass lesions or mass of affect. No reduced diffusion to suggest acute ischemia. No susceptibility artifact to suggest hemorrhage though the gradient sequences moderately motion degraded.  No abnormal extra-axial fluid collection. No abnormal calvarial bone marrow signal. Ocular globes and orbital contents are unremarkable though not tailored for evaluation. Visualized paranasal sinuses and mastoid air cells appear well-aerated. No abnormal sellar expansion. Craniocervical junction is maintained.  IMPRESSION: Mildly motion degraded examination without acute intracranial process. Normal noncontrast MRI of the brain.   Electronically Signed   By: Awilda Metroourtnay  Bloomer   On: 02/05/2015 00:45   [place] Abdomen Complete  02/04/2015   CLINICAL DATA:  Acute onset of generalized abdominal pain. Initial encounter.  EXAM: ULTRASOUND ABDOMEN COMPLETE  COMPARISON:  Right upper quadrant abdominal ultrasound performed 10/09/2008  FINDINGS: Gallbladder: No gallstones or wall thickening visualized. No sonographic Murphy sign  noted.  Common bile duct: Diameter: 0.4 cm, within normal limits in caliber.  Liver: No focal lesion identified. Mildly increased parenchymal echogenicity raises concern for fatty infiltration. Partially obscured due to overlying structures.  IVC: No abnormality visualized.  Pancreas: Visualized portion unremarkable.  Spleen: Size and appearance within normal limits.  Right Kidney: Length: 13.1 cm. Echogenicity within normal limits. No mass or hydronephrosis visualized.  Left Kidney: Length: 13.6 cm. Echogenicity within normal limits. No mass or hydronephrosis visualized.  Abdominal aorta: No aneurysm visualized.  Other findings: None.  IMPRESSION: 1. No acute abnormality seen within the abdomen. 2. Mild fatty infiltration within the liver.   Electronically Signed   By: Roanna RaiderJeffery  Chang M.D.   On: 02/04/2015 05:52   Dg Chest Port 1 View  02/04/2015   CLINICAL DATA:  Shortness of breath.  EXAM: PORTABLE CHEST - 1 VIEW  COMPARISON:  None.  FINDINGS: Heart size and pulmonary vascularity are normal and the lungs are clear. No osseous abnormality. Slight elevation of the right hemidiaphragm which is not felt to be significant.  IMPRESSION: No significant abnormalities.   Electronically Signed   By: Francene BoyersJames  Maxwell M.D.   On: 02/04/2015 07:34     PERTINENT LAB RESULTS: CBC:  Recent Labs  02/06/15 0535  WBC 4.4  HGB 11.7*  HCT 35.2*  PLT 53*   CMET CMP     Component Value Date/Time   NA 137 02/07/2015 0550   K 3.6 02/07/2015 0550   CL 105 02/07/2015 0550   CO2 24 02/07/2015 0550   GLUCOSE 108* 02/07/2015 0550   BUN <5* 02/07/2015 0550   CREATININE 0.60 02/07/2015 0550   CALCIUM 8.6 02/07/2015 0550   PROT 6.5 02/07/2015 0550   ALBUMIN 2.8* 02/07/2015 0550   AST 159* 02/07/2015 0550   ALT 76* 02/07/2015 0550   ALKPHOS 90 02/07/2015 0550   BILITOT 1.5* 02/07/2015 0550   GFRNONAA >90 02/07/2015 0550   GFRAA >90 02/07/2015 0550    GFR CrCl cannot be calculated (Unknown ideal  weight.).  Recent Labs  02/04/15 1258  LIPASE 81*    Recent Labs  02/04/15 1258  TROPONINI 0.06*   Invalid input(s): POCBNP No results for input(s): DDIMER in the last 72 hours. No results for input(s): HGBA1C in the last 72 hours. No results for input(s): CHOL, HDL, LDLCALC, TRIG, CHOLHDL, LDLDIRECT in the last 72 hours. No results for input(s): TSH, T4TOTAL, T3FREE, THYROIDAB in the last 72 hours.  Invalid input(s): FREET3 No results for input(s): VITAMINB12, FOLATE, FERRITIN, TIBC, IRON, RETICCTPCT in the last 72 hours. Coags:  Recent Labs  02/04/15 1258  INR 1.21   Microbiology: Recent Results (from the past 240 hour(s))  Clostridium Difficile by PCR     Status: None   Collection Time: 02/04/15 10:38 AM  Result Value Ref Range Status   C difficile by pcr NEGATIVE NEGATIVE Final     BRIEF HOSPITAL COURSE:  Nausea vomiting and diarrhea: Resolved with supportive care. Not sure if nausea with vomiting was from mild acute alcoholic pancreatitis or viral illness. Abdomen is very soft. Advanced diet -now tolerating regular diet.  Active Problems:  Alcoholic hepatitis: Supportive care. LFTs stable, in fact down trending. Counseled regarding importance of abstaining from alcohol.Please repeat LFT at follow up with PCP   ?Pancreatitis: No abdominal pain. Belly soft. Lipase only minimally elevated. Advanced diet now tolerating regular diet.   Alcohol withdrawal: Started on Ativan per [facility] protocol.Initially tremulous, but has markedly improved by day of discharge. She ia awake and alert. Hardly any tremors this am. She has been requesting discharge for the past 2 days, but feel that she is stable for discharge today. She requests a small supply of Ativan, which i have prescribed. I have asked her not to combine ativan and alcohol-if indeed she relapses.Note she claims she was just in a inpatient ETOH rehab in [place]-she was clean for 2 weeks, before she relapsed again. This is an  unfortunate but a chronic issue for the patient. She claims she is fully aware of outpatient resources for ETOH programs.   Note-she has been counseled extensively by this MD.RN at bedside during my daily rounds.   Dizziness: Likely secondary to dehydration/orthostatics. This has resolved. MRI brain negative.   Hypertension: Controlled with Atenolol. Follow BP and adjust as outpatient.   Hyponatremia: Likely multifactorial from dehydration from vomiting and beer potomania. Sodium levels normal at discharge.   Hypokalemia: Likely secondary to GI loss and alcohol use. Repleted and normal at time of discharge.   Mild thrombocytopenia: Likely secondary to alcohol. Follow as outpatient   Hyperlipidemia: Hold off statins given her chronic hepatitis.   TODAY-DAY OF DISCHARGE:  Subjective:   Joel Mericle today has no headache,no chest abdominal pain,no new weakness tingling or numbness, feels much better wants to go home today. She is  awake, alert and non tremulous this am. She is requesting discharge-infact she wanted to be discharged yesterday  Objective:   Blood pressure 145/69, pulse 97, temperature 98.9 F (37.2 C), temperature source Oral, resp. rate 18, SpO2 97 %.  Intake/Output Summary (Last 24 hours) at 02/07/15 0929 Last data filed at 02/06/15 2100  Gross per 24 hour  Intake    900 ml  Output      0 ml  Net    900 ml   There were no vitals filed for this visit.  Exam Awake Alert, Oriented *3, No new F.N deficits, Normal affect Holmes.AT,PERRAL Supple Neck,No JVD, No cervical lymphadenopathy appriciated.  Symmetrical Chest wall movement, Good air movement bilaterally, CTAB RRR,No Gallops,Rubs or new Murmurs, No Parasternal Heave +ve B.Sounds, Abd Soft, Non tender, No organomegaly appriciated, No rebound -guarding or rigidity. No Cyanosis, Clubbing or edema, No new Rash or bruise  DISCHARGE CONDITION: Stable  DISPOSITION: Home  DISCHARGE INSTRUCTIONS:     Activity:  As tolerated  Diet recommendation: Heart Healthy diet  Discharge Instructions    Call MD for:  extreme fatigue    Complete by:  As directed      Diet - low sodium heart healthy    Complete by:  As directed      Increase activity slowly    Complete by:  As directed            Follow-up Information    Follow up with Galvin Proffer, MD. Schedule an appointment as soon as possible for a visit on 02/09/2015.   Specialty:  Internal Medicine   Why:  keep existing appt   Contact information:   [address] [place] [address] [phone]       Total Time spent on discharge equals 45 minutes.  SignedJeoffrey Massed 02/07/2015 9:29 AM

## 2015-02-08 LAB — STOOL CULTURE

## 2015-02-09 ENCOUNTER — Other Ambulatory Visit: Payer: Self-pay

## 2015-02-09 DIAGNOSIS — Z1231 Encounter for screening mammogram for malignant neoplasm of breast: Secondary | ICD-10-CM

## 2015-02-10 LAB — VITAMIN B1: Vitamin B1 (Thiamine): 16 nmol/L (ref 8–30)

## 2015-02-11 ENCOUNTER — Encounter (HOSPITAL_COMMUNITY): Payer: Self-pay | Admitting: Licensed Clinical Social Worker

## 2015-02-12 ENCOUNTER — Encounter (HOSPITAL_COMMUNITY): Payer: Self-pay | Admitting: Licensed Clinical Social Worker

## 2015-02-12 ENCOUNTER — Telehealth (HOSPITAL_COMMUNITY): Payer: Self-pay | Admitting: Licensed Clinical Social Worker

## 2015-02-12 NOTE — Progress Notes (Signed)
Tiffany Howell is a 44 y.o. female patient. Staffed case with Tree surgeonprogram director, Maryjean Mornharles Kober. He said that she needs to be medically cleared by the hospital before coming into [facility]OP Program.Will contact patient with this information.        [name],[name], Licensed Cli

## 2015-02-12 NOTE — Progress Notes (Signed)
Tiffany Howell is a 44 y.o. female patient. Patient came in for an orieDwan Boltntation to enter the CD-IOP program. [place] and has inconsistent and unreliable transportation. She admitted to drinking daily and refused to consider [facility]. I will consult with program director tomorrow to staff the case.          [name],[name], Licensed Cli

## 2015-02-22 ENCOUNTER — Ambulatory Visit
Admission: RE | Admit: 2015-02-22 | Discharge: 2015-02-22 | Disposition: A | Discharge status: Home / Self Care | Payer: Medicaid Other | Source: Ambulatory Visit

## 2015-02-22 DIAGNOSIS — Z1231 Encounter for screening mammogram for malignant neoplasm of breast: Secondary | ICD-10-CM

## 2015-03-06 ENCOUNTER — Other Ambulatory Visit: Payer: Self-pay | Admitting: Internal Medicine

## 2015-03-22 ENCOUNTER — Encounter (HOSPITAL_COMMUNITY): Payer: Self-pay | Admitting: Emergency Medicine

## 2015-03-22 ENCOUNTER — Emergency Department (HOSPITAL_COMMUNITY)
Admission: EM | Admit: 2015-03-22 | Discharge: 2015-03-22 | Disposition: A | Discharge status: Home / Self Care | Payer: Managed Care, Other (non HMO) | Attending: Emergency Medicine | Admitting: Emergency Medicine

## 2015-03-22 ENCOUNTER — Ambulatory Visit (HOSPITAL_COMMUNITY): Payer: Managed Care, Other (non HMO)

## 2015-03-22 DIAGNOSIS — Z7951 Long term (current) use of inhaled steroids: Secondary | ICD-10-CM | POA: Insufficient documentation

## 2015-03-22 DIAGNOSIS — F419 Anxiety disorder, unspecified: Secondary | ICD-10-CM | POA: Diagnosis not present

## 2015-03-22 DIAGNOSIS — Y9289 Other specified places as the place of occurrence of the external cause: Secondary | ICD-10-CM | POA: Insufficient documentation

## 2015-03-22 DIAGNOSIS — Y998 Other external cause status: Secondary | ICD-10-CM | POA: Insufficient documentation

## 2015-03-22 DIAGNOSIS — Z79899 Other long term (current) drug therapy: Secondary | ICD-10-CM | POA: Insufficient documentation

## 2015-03-22 DIAGNOSIS — I10 Essential (primary) hypertension: Secondary | ICD-10-CM | POA: Diagnosis not present

## 2015-03-22 DIAGNOSIS — R51 Headache: Secondary | ICD-10-CM

## 2015-03-22 DIAGNOSIS — E780 Pure hypercholesterolemia: Secondary | ICD-10-CM | POA: Insufficient documentation

## 2015-03-22 DIAGNOSIS — S0083XA Contusion of other part of head, initial encounter: Secondary | ICD-10-CM | POA: Insufficient documentation

## 2015-03-22 DIAGNOSIS — R519 Headache, unspecified: Secondary | ICD-10-CM

## 2015-03-22 DIAGNOSIS — S0990XA Unspecified injury of head, initial encounter: Secondary | ICD-10-CM | POA: Diagnosis present

## 2015-03-22 DIAGNOSIS — Y9389 Activity, other specified: Secondary | ICD-10-CM | POA: Insufficient documentation

## 2015-03-22 MED ORDER — IBUPROFEN 800 MG PO TABS
800.0000 mg | ORAL_TABLET | Freq: Once | ORAL | Status: AC
  Administered 2015-03-22: 800 mg via ORAL
  Filled 2015-03-22: qty 1

## 2015-03-22 NOTE — Discharge Instructions (Signed)
Please follow-up with your primary care provider tomorrow and inform them of your emergency room visit. Please share all results with them. If new or worsening symptoms presented please return to emergency room
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
## 2015-03-22 NOTE — ED Notes (Signed)
Pt ambulated to bathroom with assist. Very shaking, unable to walk without assist. Transferred to wheelchair. Remains sleepy, cooperative. "wants to get checked out and go home"

## 2015-03-22 NOTE — ED Notes (Signed)
[facility] Expected date: 03/22/15 Expected time: 1:20 PM Means of arrival:  Comments: Triage 9

## 2015-03-22 NOTE — ED Notes (Signed)
Pt stated that she started drinking at 9:00am today. Currently sleeping in room, but easily aroused. Odor of alcohol noted. Pt c/o headache. Pt concerns reviewed with PA

## 2015-04-03 ENCOUNTER — Other Ambulatory Visit: Payer: Self-pay | Admitting: Internal Medicine

## 2015-07-10 ENCOUNTER — Encounter (HOSPITAL_COMMUNITY): Payer: Self-pay | Admitting: Emergency Medicine

## 2015-07-10 ENCOUNTER — Emergency Department (HOSPITAL_COMMUNITY)
Admission: EM | Admit: 2015-07-10 | Discharge: 2015-07-11 | Disposition: A | Discharge status: Home / Self Care | Payer: Managed Care, Other (non HMO) | Attending: Emergency Medicine | Admitting: Emergency Medicine

## 2015-07-10 DIAGNOSIS — Z7951 Long term (current) use of inhaled steroids: Secondary | ICD-10-CM | POA: Diagnosis not present

## 2015-07-10 DIAGNOSIS — F10120 Alcohol abuse with intoxication, uncomplicated: Secondary | ICD-10-CM | POA: Diagnosis not present

## 2015-07-10 DIAGNOSIS — Y908 Blood alcohol level of 240 mg/100 ml or more: Secondary | ICD-10-CM | POA: Insufficient documentation

## 2015-07-10 DIAGNOSIS — F1010 Alcohol abuse, uncomplicated: Secondary | ICD-10-CM | POA: Diagnosis present

## 2015-07-10 DIAGNOSIS — E780 Pure hypercholesterolemia: Secondary | ICD-10-CM | POA: Insufficient documentation

## 2015-07-10 DIAGNOSIS — I10 Essential (primary) hypertension: Secondary | ICD-10-CM | POA: Diagnosis not present

## 2015-07-10 DIAGNOSIS — F1110 Opioid abuse, uncomplicated: Secondary | ICD-10-CM | POA: Diagnosis not present

## 2015-07-10 DIAGNOSIS — Z79899 Other long term (current) drug therapy: Secondary | ICD-10-CM | POA: Insufficient documentation

## 2015-07-10 DIAGNOSIS — F419 Anxiety disorder, unspecified: Secondary | ICD-10-CM | POA: Diagnosis not present

## 2015-07-10 DIAGNOSIS — F10920 Alcohol use, unspecified with intoxication, uncomplicated: Secondary | ICD-10-CM

## 2015-07-10 LAB — COMPREHENSIVE METABOLIC PANEL
ALT: 39 U/L (ref 14–54)
AST: 120 U/L — ABNORMAL HIGH (ref 15–41)
Albumin: 3.2 g/dL — ABNORMAL LOW (ref 3.5–5.0)
Alkaline Phosphatase: 53 U/L (ref 38–126)
Anion gap: 8 (ref 5–15)
CO2: 29 mmol/L (ref 22–32)
Calcium: 7.9 mg/dL — ABNORMAL LOW (ref 8.9–10.3)
Chloride: 107 mmol/L (ref 101–111)
Creatinine, Ser: 0.47 mg/dL (ref 0.44–1.00)
GFR calc Af Amer: >60 mL/min (ref >60)
GLUCOSE: 100 mg/dL — AB (ref 65–99)
Potassium: 3.6 mmol/L (ref 3.5–5.1)
SODIUM: 144 mmol/L (ref 135–145)
TOTAL PROTEIN: 6.7 g/dL (ref 6.5–8.1)
Total Bilirubin: 0.9 mg/dL (ref 0.3–1.2)

## 2015-07-10 LAB — CBC
HCT: 39 % (ref 36.0–46.0)
Hemoglobin: 12.9 g/dL (ref 12.0–15.0)
MCH: 32.9 pg (ref 26.0–34.0)
MCHC: 33.1 g/dL (ref 30.0–36.0)
MCV: 99.5 fL (ref 78.0–100.0)
PLATELETS: 128 10*3/uL — AB (ref 150–400)
RBC: 3.92 MIL/uL (ref 3.87–5.11)
RDW: 13.8 % (ref 11.5–15.5)
WBC: 5.7 10*3/uL (ref 4.0–10.5)

## 2015-07-10 LAB — ACETAMINOPHEN LEVEL

## 2015-07-10 LAB — SALICYLATE LEVEL: Salicylate Lvl: <4 mg/dL (ref 2.8–30.0)

## 2015-07-10 LAB — RAPID URINE DRUG SCREEN, HOSP PERFORMED
Amphetamines: NOT DETECTED
BARBITURATES: NOT DETECTED
Benzodiazepines: NOT DETECTED
Cocaine: NOT DETECTED
OPIATES: POSITIVE — AB
Tetrahydrocannabinol: NOT DETECTED

## 2015-07-10 LAB — ETHANOL: Alcohol, Ethyl (B): 376 mg/dL (ref <5)

## 2015-07-10 MED ORDER — THIAMINE HCL 100 MG/ML IJ SOLN: 100.0000 mg | Freq: Every day | INTRAMUSCULAR | Status: DC

## 2015-07-10 MED ORDER — ACETAMINOPHEN 325 MG PO TABS: 650.0000 mg | ORAL_TABLET | ORAL | Status: DC | PRN

## 2015-07-10 MED ORDER — FLUTICASONE PROPIONATE 50 MCG/ACT NA SUSP
2.0000 | Freq: Every day | NASAL | Status: DC
  Administered 2015-07-11: 2 via NASAL
  Filled 2015-07-10: qty 16

## 2015-07-10 MED ORDER — ALPRAZOLAM 0.5 MG PO TABS: 1.0000 mg | ORAL_TABLET | Freq: Three times a day (TID) | ORAL | Status: DC | PRN

## 2015-07-10 MED ORDER — LORAZEPAM 1 MG PO TABS
0.0000 mg | ORAL_TABLET | Freq: Two times a day (BID) | ORAL | Status: DC
  Administered 2015-07-11: 1 mg via ORAL

## 2015-07-10 MED ORDER — NICOTINE 21 MG/24HR TD PT24
21.0000 mg | MEDICATED_PATCH | Freq: Every day | TRANSDERMAL | Status: DC
  Filled 2015-07-10: qty 1

## 2015-07-10 MED ORDER — CITALOPRAM HYDROBROMIDE 40 MG PO TABS
40.0000 mg | ORAL_TABLET | Freq: Every day | ORAL | Status: DC
  Administered 2015-07-11: 40 mg via ORAL
  Filled 2015-07-10: qty 1

## 2015-07-10 MED ORDER — ONDANSETRON HCL 4 MG PO TABS
4.0000 mg | ORAL_TABLET | Freq: Three times a day (TID) | ORAL | Status: DC | PRN
Start: 2015-07-10 — End: 2015-07-11

## 2015-07-10 MED ORDER — LORAZEPAM 2 MG/ML IJ SOLN: 0.0000 mg | Freq: Two times a day (BID) | INTRAMUSCULAR | Status: DC

## 2015-07-10 MED ORDER — LORAZEPAM 2 MG/ML IJ SOLN: 0.0000 mg | Freq: Four times a day (QID) | INTRAMUSCULAR | Status: DC

## 2015-07-10 MED ORDER — GABAPENTIN 300 MG PO CAPS
300.0000 mg | ORAL_CAPSULE | Freq: Three times a day (TID) | ORAL | Status: DC
  Administered 2015-07-10 – 2015-07-11 (×2): 300 mg via ORAL
  Filled 2015-07-10 (×2): qty 1

## 2015-07-10 MED ORDER — VITAMIN B-1 100 MG PO TABS
100.0000 mg | ORAL_TABLET | Freq: Every day | ORAL | Status: DC
  Administered 2015-07-11: 100 mg via ORAL
  Filled 2015-07-10: qty 1

## 2015-07-10 MED ORDER — LORAZEPAM 1 MG PO TABS: 1.0000 mg | ORAL_TABLET | Freq: Three times a day (TID) | ORAL | Status: DC | PRN

## 2015-07-10 MED ORDER — LISINOPRIL 10 MG PO TABS
10.0000 mg | ORAL_TABLET | Freq: Every day | ORAL | Status: DC
  Administered 2015-07-11: 10 mg via ORAL
  Filled 2015-07-10: qty 1

## 2015-07-10 MED ORDER — ALUM & MAG HYDROXIDE-SIMETH 200-200-20 MG/5ML PO SUSP: 30.0000 mL | ORAL | Status: DC | PRN

## 2015-07-10 MED ORDER — IBUPROFEN 200 MG PO TABS
600.0000 mg | ORAL_TABLET | Freq: Three times a day (TID) | ORAL | Status: DC | PRN
  Administered 2015-07-11: 600 mg via ORAL
  Filled 2015-07-10: qty 3

## 2015-07-10 MED ORDER — FOLIC ACID 1 MG PO TABS
1.0000 mg | ORAL_TABLET | Freq: Every day | ORAL | Status: DC
  Administered 2015-07-11: 1 mg via ORAL
  Filled 2015-07-10: qty 1

## 2015-07-10 MED ORDER — ZOLPIDEM TARTRATE 5 MG PO TABS: 5.0000 mg | ORAL_TABLET | Freq: Every evening | ORAL | Status: DC | PRN

## 2015-07-10 MED ORDER — LORAZEPAM 1 MG PO TABS
0.0000 mg | ORAL_TABLET | Freq: Four times a day (QID) | ORAL | Status: DC
  Administered 2015-07-11: 1 mg via ORAL
  Filled 2015-07-10 (×3): qty 1

## 2015-07-10 NOTE — ED Notes (Signed)
Critical ETOH 376 Read back and verified with Toniann FailWendy (lab) Primary RN and MD notified

## 2015-07-10 NOTE — ED Provider Notes (Signed)
CSN: [id number]     Arrival date & time 07/10/15  1716 History   First MD Initiated Contact with Patient 07/10/15 1726     Chief Complaint  Patient presents with  . Alcohol Problem     (Consider location/radiation/quality/duration/timing/severity/associated sxs/prior Treatment) Patient is a 44 y.o. female presenting with alcohol problem. The history is provided by the patient.  Alcohol Problem This is a chronic problem. The current episode started more than 1 week ago. The problem occurs constantly. The problem has not changed since onset.Pertinent negatives include no abdominal pain and no shortness of breath. Nothing aggravates the symptoms. Nothing relieves the symptoms. She has tried nothing for the symptoms.    Past Medical History  Diagnosis Date  . Hypertension   . Anxiety   . Hypercholesteremia    History reviewed. No pertinent past surgical history. Family History  Problem Relation Age of Onset  . Diabetes Mellitus II Mother   . Hypertension Mother   . Heart failure Mother    History  Substance Use Topics  . Smoking status: Never Smoker   . Smokeless tobacco: Not on file  . Alcohol Use: Yes   OB History    No data available     Review of Systems  Constitutional: Negative for fever.  Respiratory: Negative for cough and shortness of breath.   Gastrointestinal: Negative for vomiting and abdominal pain.  All other systems reviewed and are negative.     Allergies  Review of patient's allergies indicates no known allergies.  Home Medications   Prior to Admission medications   Medication Sig Start Date End Date Taking? Authorizing Provider  ALPRAZolam Prudy Feeler(XANAX) 1 MG tablet Take 1 mg by mouth 3 (three) times daily as needed for anxiety.    Historical Provider, MD  atenolol (TENORMIN) 25 MG tablet Take 1 tablet (25 mg total) by mouth daily. 02/07/15   Shanker Levora DredgeM Ghimire, MD  clonazePAM (KLONOPIN) 1 MG tablet Take 1 mg by mouth 2 (two) times daily.    Historical  Provider, MD  fluticasone (FLONASE) 50 MCG/ACT nasal spray Place 2 sprays into both nostrils daily.    Historical Provider, MD  folic acid (FOLVITE) 1 MG tablet Take 1 tablet (1 mg total) by mouth daily. 02/07/15   Shanker Levora DredgeM Ghimire, MD  HYDROcodone-acetaminophen (NORCO) 7.5-325 MG per tablet Take 1 tablet by mouth every 12 (twelve) hours as needed for moderate pain.    Historical Provider, MD  lisinopril-hydrochlorothiazide (PRINZIDE,ZESTORETIC) 20-25 MG per tablet Take 1 tablet by mouth daily.    Historical Provider, MD  LORazepam (ATIVAN) 1 MG tablet Take 1 mg po twice daily for 2 day, then, Take 1 mg po daily for 1 day, then, Take 1 mg po daily as needed 02/07/15   Maretta BeesShanker M Ghimire, MD  medroxyPROGESTERone (DEPO-PROVERA) 150 MG/ML injection Inject 150 mg into the muscle every 3 (three) months.    Historical Provider, MD  methocarbamol (ROBAXIN) 500 MG tablet Take 1,000 mg by mouth 3 (three) times daily as needed for muscle spasms.    Historical Provider, MD  Multiple Vitamin (MULTIVITAMIN WITH MINERALS) TABS tablet Take 1 tablet by mouth daily. 02/07/15   Shanker Levora DredgeM Ghimire, MD  naltrexone (DEPADE) 50 MG tablet Take 50 mg by mouth daily with breakfast.    Historical Provider, MD  naphazoline (NAPHCON) 0.1 % ophthalmic solution Place 2 drops into both eyes 4 (four) times daily as needed for irritation or allergies. 02/07/15   Shanker Levora DredgeM Ghimire, MD  phentermine (ADIPEX-P) 37.5  MG tablet Take 37.5 mg by mouth daily before breakfast.    Historical Provider, MD  prazosin (MINIPRESS) 5 MG capsule Take 5 mg by mouth at bedtime.    Historical Provider, MD  rosuvastatin (CRESTOR) 5 MG tablet Take 5 mg by mouth daily.    Historical Provider, MD  thiamine 100 MG tablet Take 1 tablet (100 mg total) by mouth daily. 02/07/15   Shanker Levora Dredge, MD  traZODone (DESYREL) 100 MG tablet Take 100 mg by mouth at bedtime.    Historical Provider, MD  venlafaxine (EFFEXOR) 50 MG tablet Take 50 mg by mouth 2 (two) times  daily.    Historical Provider, MD   BP 140/72 mmHg  Pulse 115  Temp(Src) 98.6 F (37 C) (Oral)  Resp 18  SpO2 97% Physical Exam  Constitutional: She is oriented to person, place, and time. She appears well-developed and well-nourished. No distress.  HENT:  Head: Normocephalic and atraumatic.  Mouth/Throat: Oropharynx is clear and moist.  Eyes: EOM are normal. Pupils are equal, round, and reactive to light.  Neck: Normal range of motion. Neck supple.  Cardiovascular: Normal rate and regular rhythm.  Exam reveals no friction rub.   No murmur heard. Pulmonary/Chest: Effort normal and breath sounds normal. No respiratory distress. She has no wheezes. She has no rales.  Abdominal: Soft. She exhibits no distension. There is no tenderness. There is no rebound.  Musculoskeletal: Normal range of motion. She exhibits no edema.  Neurological: She is alert and oriented to person, place, and time. She exhibits normal muscle tone.  Skin: No rash noted. She is not diaphoretic.  Nursing note and vitals reviewed.   ED Course  Procedures (including critical care time) Labs Review Labs Reviewed  CBC  COMPREHENSIVE METABOLIC PANEL  ETHANOL  SALICYLATE LEVEL  ACETAMINOPHEN LEVEL  URINE RAPID DRUG SCREEN, HOSP PERFORMED    Imaging Review No results found.   EKG Interpretation None      MDM   Final diagnoses:  Alcohol intoxication, uncomplicated    35 -year-old female here wanting detox and alcohol. Her friend stated she drank around 24 beers today. Patient also told her friend, Tiffany Howell, that she didn't have a reason to live multiple times today. Patient here denies any suicidal ideation. Tiffany Howell also stated she was taking a lot of pain medication today.  Patient is intoxicated here did syncopized she held her breath during a blood draw. She is doing well afterwards. We'll check labs and reevaluate her after sobering up. Patient's alcohol extremely elevated. She's trying to leave. With  ambiguous story, will IVC. I believe she's not suicidal, but needs to get a Psych eval.  [name], MD 07/10/15 2214

## 2015-07-10 NOTE — ED Notes (Signed)
Pt is awake and alert, denies HI/SI. Pt's friend at bedside states "we both agreed that she needs helps to stop drinking." pt reports she has a lot going on with in her family live and doesn't wish to elaborate at this time. Will continue to monitor for safety.

## 2015-07-10 NOTE — ED Notes (Signed)
Pt request detox from ETOH; reports two 12 packs today and self medicating with ETOH for chronic neck/back pain.

## 2015-07-10 NOTE — ED Notes (Signed)
Patient came from Triage and Patient placed in hallway bed. No report given by triage RN. Pt was resting. CN asked for pt to be dressed out. Pt transferred to TCU prior to primary RN assessing. There is uncertainty why pt is being placed in TCU as there is no note indicating SI/HI. Note makes reference to ETOH abuse.

## 2015-07-11 MED ORDER — LORAZEPAM 1 MG PO TABS
2.0000 mg | ORAL_TABLET | Freq: Once | ORAL | Status: AC
  Administered 2015-07-11: 2 mg via ORAL
  Filled 2015-07-11: qty 2

## 2015-07-11 NOTE — Discharge Instructions (Signed)
Alcohol Intoxication  Alcohol intoxication occurs when the amount of alcohol that a person has consumed impairs his or her ability to mentally and physically function. Alcohol directly impairs the normal chemical activity of the brain. Drinking large amounts of alcohol can lead to changes in mental function and behavior, and it can cause many physical effects that can be harmful.   Alcohol intoxication can range in severity from mild to very severe. Various factors can affect the level of intoxication that occurs, such as the person's age, gender, weight, frequency of alcohol consumption, and the presence of other medical conditions (such as diabetes, seizures, or heart conditions). Dangerous levels of alcohol intoxication may occur when people drink large amounts of alcohol in a short period (binge drinking). Alcohol can also be especially dangerous when combined with certain prescription medicines or "recreational" drugs.  SIGNS AND SYMPTOMS  Some common signs and symptoms of mild alcohol intoxication include:  · Loss of coordination.  · Changes in mood and behavior.  · Impaired judgment.  · Slurred speech.  As alcohol intoxication progresses to more severe levels, other signs and symptoms will appear. These may include:  · Vomiting.  · Confusion and impaired memory.  · Slowed breathing.  · Seizures.  · Loss of consciousness.  DIAGNOSIS   Your health care provider will take a medical history and perform a physical exam. You will be asked about the amount and type of alcohol you have consumed. Blood tests will be done to measure the concentration of alcohol in your blood. In many places, your blood alcohol level must be lower than 80 mg/dL (0.08%) to legally drive. However, many dangerous effects of alcohol can occur at much lower levels.   TREATMENT   People with alcohol intoxication often do not require treatment. Most of the effects of alcohol intoxication are temporary, and they go away as the alcohol naturally  leaves the body. Your health care provider will monitor your condition until you are stable enough to go home. Fluids are sometimes given through an IV access tube to help prevent dehydration.   HOME CARE INSTRUCTIONS  · Do not drive after drinking alcohol.  · Stay hydrated. Drink enough water and fluids to keep your urine clear or pale yellow. Avoid caffeine.    · Only take over-the-counter or prescription medicines as directed by your health care provider.    SEEK MEDICAL CARE IF:   · You have persistent vomiting.    · You do not feel better after a few days.  · You have frequent alcohol intoxication. Your health care provider can help determine if you should see a substance use treatment counselor.  SEEK IMMEDIATE MEDICAL CARE IF:   · You become shaky or tremble when you try to stop drinking.    · You shake uncontrollably (seizure).    · You throw up (vomit) blood. This may be bright red or may look like black coffee grounds.    · You have blood in your stool. This may be bright red or may appear as a black, tarry, bad smelling stool.    · You become lightheaded or faint.    MAKE SURE YOU:   · Understand these instructions.  · Will watch your condition.  · Will get help right away if you are not doing well or get worse.  Document Released: 09/20/2005 Document Revised: 08/13/2013 Document Reviewed: 05/16/2013  ExitCare® Patient Information ©2015 [employer]. This information is not intended to replace advice given to you by your health care provider. Make sure   you discuss any questions you have with your health care provider.

## 2015-07-11 NOTE — ED Notes (Signed)
Discharge instructions reviewed with patient no questions at this time. Patient discharged to home.

## 2015-07-11 NOTE — ED Provider Notes (Addendum)
Pt seen and examined today--she admits to struggling with etoh abuse and has a therapist--had been sober but recently binged, denies si/hi--states that she has 3 children to live for--agreeable to following up with her outpt therapist.  Lorre NickAnthony Ciclaly Mulcahey, MD 07/11/15 [id number]  Lorre NickAnthony Bjorn Hallas, MD 07/11/15 [phone]

## 2015-07-30 ENCOUNTER — Emergency Department (HOSPITAL_COMMUNITY): Payer: Managed Care, Other (non HMO)

## 2015-07-30 ENCOUNTER — Emergency Department (HOSPITAL_COMMUNITY)
Admission: EM | Admit: 2015-07-30 | Discharge: 2015-07-30 | Disposition: A | Discharge status: Home / Self Care | Payer: Managed Care, Other (non HMO) | Attending: Emergency Medicine | Admitting: Emergency Medicine

## 2015-07-30 ENCOUNTER — Encounter (HOSPITAL_COMMUNITY): Payer: Self-pay

## 2015-07-30 DIAGNOSIS — Y9289 Other specified places as the place of occurrence of the external cause: Secondary | ICD-10-CM | POA: Diagnosis not present

## 2015-07-30 DIAGNOSIS — Y9389 Activity, other specified: Secondary | ICD-10-CM | POA: Insufficient documentation

## 2015-07-30 DIAGNOSIS — H9222 Otorrhagia, left ear: Secondary | ICD-10-CM | POA: Diagnosis not present

## 2015-07-30 DIAGNOSIS — Z79899 Other long term (current) drug therapy: Secondary | ICD-10-CM | POA: Insufficient documentation

## 2015-07-30 DIAGNOSIS — I10 Essential (primary) hypertension: Secondary | ICD-10-CM | POA: Diagnosis not present

## 2015-07-30 DIAGNOSIS — Y998 Other external cause status: Secondary | ICD-10-CM | POA: Insufficient documentation

## 2015-07-30 DIAGNOSIS — H748X2 Other specified disorders of left middle ear and mastoid: Secondary | ICD-10-CM

## 2015-07-30 DIAGNOSIS — S3992XA Unspecified injury of lower back, initial encounter: Secondary | ICD-10-CM | POA: Diagnosis not present

## 2015-07-30 DIAGNOSIS — F419 Anxiety disorder, unspecified: Secondary | ICD-10-CM | POA: Diagnosis not present

## 2015-07-30 DIAGNOSIS — S0011XA Contusion of right eyelid and periocular area, initial encounter: Secondary | ICD-10-CM | POA: Diagnosis not present

## 2015-07-30 DIAGNOSIS — E780 Pure hypercholesterolemia: Secondary | ICD-10-CM | POA: Diagnosis not present

## 2015-07-30 DIAGNOSIS — S0591XA Unspecified injury of right eye and orbit, initial encounter: Secondary | ICD-10-CM | POA: Diagnosis present

## 2015-07-30 DIAGNOSIS — Z7951 Long term (current) use of inhaled steroids: Secondary | ICD-10-CM | POA: Diagnosis not present

## 2015-07-30 DIAGNOSIS — T148XXA Other injury of unspecified body region, initial encounter: Secondary | ICD-10-CM

## 2015-07-30 LAB — HCG, QUANTITATIVE, PREGNANCY

## 2015-07-30 MED ORDER — CYCLOBENZAPRINE HCL 5 MG PO TABS: 5.0000 mg | ORAL_TABLET | Freq: Three times a day (TID) | ORAL | Status: DC | PRN

## 2015-07-30 MED ORDER — HYDROCODONE-ACETAMINOPHEN 5-325 MG PO TABS
1.0000 | ORAL_TABLET | Freq: Once | ORAL | Status: AC
  Administered 2015-07-30: 1 via ORAL
  Filled 2015-07-30: qty 1

## 2015-07-30 MED ORDER — HYDROCODONE-ACETAMINOPHEN 5-325 MG PO TABS: 1.0000 | ORAL_TABLET | ORAL | Status: DC | PRN

## 2015-07-30 MED ORDER — ONDANSETRON 8 MG PO TBDP
8.0000 mg | ORAL_TABLET | Freq: Once | ORAL | Status: AC
  Administered 2015-07-30: 8 mg via ORAL
  Filled 2015-07-30: qty 1
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
## 2015-07-30 NOTE — Discharge Instructions (Signed)
Contusion °A contusion is a deep bruise. Contusions are the result of an injury that caused bleeding under the skin. The contusion may turn blue, purple, or yellow. Minor injuries will give you a painless contusion, but more severe contusions may stay painful and swollen for a few weeks.  °CAUSES  °A contusion is usually caused by a blow, trauma, or direct force to an area of the body. °SYMPTOMS  °· Swelling and redness of the injured area. °· Bruising of the injured area. °· Tenderness and soreness of the injured area. °· Pain. °DIAGNOSIS  °The diagnosis can be made by taking a history and physical exam. An X-ray, CT scan, or MRI may be needed to determine if there were any associated injuries, such as fractures. °TREATMENT  °Specific treatment will depend on what area of the body was injured. In general, the best treatment for a contusion is resting, icing, elevating, and applying cold compresses to the injured area. Over-the-counter medicines may also be recommended for pain control. Ask your caregiver what the best treatment is for your contusion. °HOME CARE INSTRUCTIONS  °· Put ice on the injured area. °¨ Put ice in a plastic bag. °¨ Place a towel between your skin and the bag. °¨ Leave the ice on for 15-20 minutes, 3-4 times a day, or as directed by your health care provider. °· Only take over-the-counter or prescription medicines for pain, discomfort, or fever as directed by your caregiver. Your caregiver may recommend avoiding anti-inflammatory medicines (aspirin, ibuprofen, and naproxen) for 48 hours because these medicines may increase bruising. °· Rest the injured area. °· If possible, elevate the injured area to reduce swelling. °SEEK IMMEDIATE MEDICAL CARE IF:  °· You have increased bruising or swelling. °· You have pain that is getting worse. °· Your swelling or pain is not relieved with medicines. °MAKE SURE YOU:  °· Understand these instructions. °· Will watch your condition. °· Will get help right  away if you are not doing well or get worse. °Document Released: 09/20/2005 Document Revised: 12/16/2013 Document Reviewed: 10/16/2011 °ExitCare® Patient Information ©2015 [employer]. This information is not intended to replace advice given to you by your health care provider. Make sure you discuss any questions you have with your health care provider. ° °
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
## 2015-10-10 ENCOUNTER — Telehealth (HOSPITAL_BASED_OUTPATIENT_CLINIC_OR_DEPARTMENT_OTHER): Payer: Self-pay | Admitting: Emergency Medicine

## 2016-02-12 ENCOUNTER — Inpatient Hospital Stay (HOSPITAL_COMMUNITY)
Admission: EM | Admit: 2016-02-12 | Discharge: 2016-02-15 | DRG: 894 | Discharge status: Against Medical Advice | Payer: Medicaid Other | Attending: Internal Medicine | Admitting: Internal Medicine

## 2016-02-12 DIAGNOSIS — I10 Essential (primary) hypertension: Secondary | ICD-10-CM | POA: Diagnosis present

## 2016-02-12 DIAGNOSIS — Z833 Family history of diabetes mellitus: Secondary | ICD-10-CM

## 2016-02-12 DIAGNOSIS — D696 Thrombocytopenia, unspecified: Secondary | ICD-10-CM | POA: Diagnosis present

## 2016-02-12 DIAGNOSIS — E7800 Pure hypercholesterolemia, unspecified: Secondary | ICD-10-CM | POA: Diagnosis present

## 2016-02-12 DIAGNOSIS — F10939 Alcohol use, unspecified with withdrawal, unspecified: Secondary | ICD-10-CM | POA: Diagnosis present

## 2016-02-12 DIAGNOSIS — K760 Fatty (change of) liver, not elsewhere classified: Secondary | ICD-10-CM | POA: Diagnosis present

## 2016-02-12 DIAGNOSIS — R945 Abnormal results of liver function studies: Secondary | ICD-10-CM

## 2016-02-12 DIAGNOSIS — E785 Hyperlipidemia, unspecified: Secondary | ICD-10-CM | POA: Diagnosis present

## 2016-02-12 DIAGNOSIS — F419 Anxiety disorder, unspecified: Secondary | ICD-10-CM | POA: Diagnosis present

## 2016-02-12 DIAGNOSIS — F10929 Alcohol use, unspecified with intoxication, unspecified: Secondary | ICD-10-CM

## 2016-02-12 DIAGNOSIS — Y908 Blood alcohol level of 240 mg/100 ml or more: Secondary | ICD-10-CM | POA: Diagnosis present

## 2016-02-12 DIAGNOSIS — F1024 Alcohol dependence with alcohol-induced mood disorder: Secondary | ICD-10-CM | POA: Diagnosis present

## 2016-02-12 DIAGNOSIS — R718 Other abnormality of red blood cells: Secondary | ICD-10-CM | POA: Diagnosis present

## 2016-02-12 DIAGNOSIS — Z79899 Other long term (current) drug therapy: Secondary | ICD-10-CM

## 2016-02-12 DIAGNOSIS — F10229 Alcohol dependence with intoxication, unspecified: Secondary | ICD-10-CM | POA: Diagnosis present

## 2016-02-12 DIAGNOSIS — R000 Tachycardia, unspecified: Secondary | ICD-10-CM | POA: Diagnosis present

## 2016-02-12 DIAGNOSIS — F329 Major depressive disorder, single episode, unspecified: Secondary | ICD-10-CM | POA: Diagnosis present

## 2016-02-12 DIAGNOSIS — R45851 Suicidal ideations: Secondary | ICD-10-CM

## 2016-02-12 DIAGNOSIS — Z8249 Family history of ischemic heart disease and other diseases of the circulatory system: Secondary | ICD-10-CM

## 2016-02-12 DIAGNOSIS — R7989 Other specified abnormal findings of blood chemistry: Secondary | ICD-10-CM | POA: Diagnosis present

## 2016-02-12 DIAGNOSIS — E876 Hypokalemia: Secondary | ICD-10-CM | POA: Diagnosis present

## 2016-02-12 DIAGNOSIS — Z6841 Body Mass Index (BMI) 40.0 and over, adult: Secondary | ICD-10-CM

## 2016-02-12 DIAGNOSIS — F10239 Alcohol dependence with withdrawal, unspecified: Principal | ICD-10-CM | POA: Diagnosis present

## 2016-02-12 HISTORY — DX: Major depressive disorder, single episode, unspecified: F32.9

## 2016-02-12 HISTORY — DX: Fatty (change of) liver, not elsewhere classified: K76.0

## 2016-02-12 HISTORY — DX: Depression, unspecified: F32.A

## 2016-02-12 LAB — COMPREHENSIVE METABOLIC PANEL
ALT: 50 U/L (ref 14–54)
AST: 143 U/L — AB (ref 15–41)
Albumin: 3.9 g/dL (ref 3.5–5.0)
Alkaline Phosphatase: 94 U/L (ref 38–126)
Anion gap: 11 (ref 5–15)
BUN: <5 mg/dL — ABNORMAL LOW (ref 6–20)
CHLORIDE: 102 mmol/L (ref 101–111)
CO2: 29 mmol/L (ref 22–32)
CREATININE: 0.59 mg/dL (ref 0.44–1.00)
Calcium: 8.7 mg/dL — ABNORMAL LOW (ref 8.9–10.3)
Glucose, Bld: 94 mg/dL (ref 65–99)
POTASSIUM: 3.8 mmol/L (ref 3.5–5.1)
SODIUM: 142 mmol/L (ref 135–145)
Total Bilirubin: 1.1 mg/dL (ref 0.3–1.2)
Total Protein: 8.4 g/dL — ABNORMAL HIGH (ref 6.5–8.1)

## 2016-02-12 LAB — CBC
HCT: 45.1 % (ref 36.0–46.0)
Hemoglobin: 14.5 g/dL (ref 12.0–15.0)
MCH: 34.2 pg — ABNORMAL HIGH (ref 26.0–34.0)
MCHC: 32.2 g/dL (ref 30.0–36.0)
MCV: 106.4 fL — ABNORMAL HIGH (ref 78.0–100.0)
Platelets: 149 K/uL — ABNORMAL LOW (ref 150–400)
RBC: 4.24 MIL/uL (ref 3.87–5.11)
RDW: 12.8 % (ref 11.5–15.5)
WBC: 9.2 K/uL (ref 4.0–10.5)

## 2016-02-12 LAB — RAPID URINE DRUG SCREEN, HOSP PERFORMED
AMPHETAMINES: NOT DETECTED
BENZODIAZEPINES: NOT DETECTED
Barbiturates: NOT DETECTED
COCAINE: NOT DETECTED
OPIATES: NOT DETECTED
TETRAHYDROCANNABINOL: NOT DETECTED

## 2016-02-12 LAB — ETHANOL: ALCOHOL ETHYL (B): 420 mg/dL — AB (ref <5)

## 2016-02-12 LAB — ACETAMINOPHEN LEVEL: Acetaminophen (Tylenol), Serum: <10 ug/mL — ABNORMAL LOW (ref 10–30)

## 2016-02-12 LAB — SALICYLATE LEVEL

## 2016-02-12 LAB — POC URINE PREG, ED: Preg Test, Ur: NEGATIVE

## 2016-02-12 NOTE — ED Notes (Signed)
Bed: WA11 Expected date:  Expected time:  Means of arrival:  Comments: Rm 8 

## 2016-02-12 NOTE — ED Notes (Signed)
etoh [id number], RN kristen notified

## 2016-02-12 NOTE — ED Notes (Signed)
UNABLE TO COLLECT PATIENT LABS PATIENT IS NOT IN HER ROOM.

## 2016-02-12 NOTE — ED Provider Notes (Signed)
CSN: [id number]     Arrival date & time 02/12/16  1948 History   First MD Initiated Contact with Patient 02/12/16 2016     Chief Complaint  Patient presents with  . Suicidal  . Alcohol Intoxication     (Consider location/radiation/quality/duration/timing/severity/associated sxs/prior Treatment) HPI Comments: Patient presents to the emergency department with chief complaint of alcohol abuse. She states that she has been drinking heavily her quite some time now. She is unable to quantify how much she has been drinking. She states that she drinks daily. She states that she feels hopeless and worthless. States that she "wishes it would all just end." She denies any recreational drug use. She states that she would like to die, but does not know how to kill herself. She does not express any intent to harm anyone else. She denies any other problems.  The history is provided by the patient. No language interpreter was used.    Past Medical History  Diagnosis Date  . Hypertension   . Anxiety   . Hypercholesteremia    No past surgical history on file. Family History  Problem Relation Age of Onset  . Diabetes Mellitus II Mother   . Hypertension Mother   . Heart failure Mother    Social History  Substance Use Topics  . Smoking status: Never Smoker   . Smokeless tobacco: Not on file  . Alcohol Use: Yes     Comment: 12 pack today   OB History    No data available     Review of Systems  All other systems reviewed and are negative.     Allergies  Review of patient's allergies indicates no known allergies.  Home Medications   Prior to Admission medications   Medication Sig Start Date End Date Taking? Authorizing Provider  ALPRAZolam Prudy Feeler) 1 MG tablet Take 1 mg by mouth 3 (three) times daily as needed for anxiety.   Yes Historical Provider, MD  citalopram (CELEXA) 40 MG tablet Take 40 mg by mouth daily. 06/10/15  Yes Historical Provider, MD  folic acid (FOLVITE) 1 MG tablet Take  1 tablet (1 mg total) by mouth daily. 02/07/15  Yes Shanker Levora Dredge, MD  HYDROcodone-acetaminophen (NORCO) 7.5-325 MG per tablet Take 1 tablet by mouth every 12 (twelve) hours as needed for moderate pain.   Yes Historical Provider, MD  ibuprofen (ADVIL,MOTRIN) 200 MG tablet Take 600 mg by mouth every 6 (six) hours as needed for moderate pain.   Yes Historical Provider, MD  lisinopril (PRINIVIL,ZESTRIL) 10 MG tablet TAKE 1 TABLET EVERY DAY AND STOP THE LISINOPRIL/HCTZ 06/24/15  Yes Historical Provider, MD  Multiple Vitamin (MULTIVITAMIN WITH MINERALS) TABS tablet Take 1 tablet by mouth daily. 02/07/15  Yes Shanker Levora Dredge, MD  rosuvastatin (CRESTOR) 5 MG tablet Take 5 mg by mouth daily.   Yes Historical Provider, MD  thiamine 100 MG tablet Take 1 tablet (100 mg total) by mouth daily. 02/07/15  Yes Shanker Levora Dredge, MD  atenolol (TENORMIN) 25 MG tablet Take 1 tablet (25 mg total) by mouth daily. Patient not taking: Reported on 07/10/2015 02/07/15   Maretta Bees, MD  cyclobenzaprine (FLEXERIL) 5 MG tablet Take 1 tablet (5 mg total) by mouth 3 (three) times daily as needed for muscle spasms. Patient not taking: Reported on 02/12/2016 07/30/15   Teressa Lower, NP  HYDROcodone-acetaminophen (NORCO/VICODIN) 5-325 MG per tablet Take 1 tablet by mouth every 4 (four) hours as needed. Patient not taking: Reported on 02/12/2016 07/30/15  Teressa Lower, NP  LORazepam (ATIVAN) 1 MG tablet Take 1 mg po twice daily for 2 day, then, Take 1 mg po daily for 1 day, then, Take 1 mg po daily as needed Patient not taking: Reported on 07/10/2015 02/07/15   Maretta Bees, MD  naphazoline (NAPHCON) 0.1 % ophthalmic solution Place 2 drops into both eyes 4 (four) times daily as needed for irritation or allergies. Patient not taking: Reported on 07/10/2015 02/07/15   Maretta Bees, MD   BP 134/71 mmHg  Pulse 99  Temp(Src) 99.3 F (37.4 C) (Oral)  Resp 22  SpO2 100% Physical Exam  Constitutional: She is  oriented to person, place, and time. She appears well-developed and well-nourished.  HENT:  Head: Normocephalic and atraumatic.  Eyes: Conjunctivae and EOM are normal. Pupils are equal, round, and reactive to light.  Neck: Normal range of motion. Neck supple.  Cardiovascular: Normal rate and regular rhythm.  Exam reveals no gallop and no friction rub.   No murmur heard. Pulmonary/Chest: Effort normal and breath sounds normal. No respiratory distress. She has no wheezes. She has no rales. She exhibits no tenderness.  Abdominal: Soft. Bowel sounds are normal. She exhibits no distension and no mass. There is no tenderness. There is no rebound and no guarding.  Musculoskeletal: Normal range of motion. She exhibits no edema or tenderness.  Neurological: She is alert and oriented to person, place, and time.  Skin: Skin is warm and dry.  Psychiatric: She has a normal mood and affect. Her behavior is normal. Judgment and thought content normal.  Nursing note and vitals reviewed.   ED Course  Procedures (including critical care time) Results for orders placed or performed during the hospital encounter of 02/12/16  Comprehensive metabolic panel  Result Value Ref Range   Sodium 142 135 - 145 mmol/L   Potassium 3.8 3.5 - 5.1 mmol/L   Chloride 102 101 - 111 mmol/L   CO2 29 22 - 32 mmol/L   Glucose, Bld 94 65 - 99 mg/dL   BUN <5 (L) 6 - 20 mg/dL   Creatinine, Ser 1.61 0.44 - 1.00 mg/dL   Calcium 8.7 (L) 8.9 - 10.3 mg/dL   Total Protein 8.4 (H) 6.5 - 8.1 g/dL   Albumin 3.9 3.5 - 5.0 g/dL   AST 096 (H) 15 - 41 U/L   ALT 50 14 - 54 U/L   Alkaline Phosphatase 94 38 - 126 U/L   Total Bilirubin 1.1 0.3 - 1.2 mg/dL   GFR calc non Af Amer >60 >60 mL/min   GFR calc Af Amer >60 >60 mL/min   Anion gap 11 5 - 15  Ethanol (ETOH)  Result Value Ref Range   Alcohol, Ethyl (B) 420 (HH) <5 mg/dL  Salicylate level  Result Value Ref Range   Salicylate Lvl <4.0 2.8 - 30.0 mg/dL  Acetaminophen level  Result  Value Ref Range   Acetaminophen (Tylenol), Serum <10 (L) 10 - 30 ug/mL  CBC  Result Value Ref Range   WBC 9.2 4.0 - 10.5 K/uL   RBC 4.24 3.87 - 5.11 MIL/uL   Hemoglobin 14.5 12.0 - 15.0 g/dL   HCT 04.5 40.9 - 81.1 %   MCV 106.4 (H) 78.0 - 100.0 fL   MCH 34.2 (H) 26.0 - 34.0 pg   MCHC 32.2 30.0 - 36.0 g/dL   RDW 91.4 78.2 - 95.6 %   Platelets 149 (L) 150 - 400 K/uL  Urine rapid drug screen (hosp performed) (Not at  [facility])  Result Value Ref Range   Opiates NONE DETECTED NONE DETECTED   Cocaine NONE DETECTED NONE DETECTED   Benzodiazepines NONE DETECTED NONE DETECTED   Amphetamines NONE DETECTED NONE DETECTED   Tetrahydrocannabinol NONE DETECTED NONE DETECTED   Barbiturates NONE DETECTED NONE DETECTED  POC urine preg, ED (not at [facility]  Result Value Ref Range   Preg Test, Ur NEGATIVE NEGATIVE   No results found.  I have personally reviewed and evaluated these images and lab results as part of my medical decision-making.   EKG Interpretation None      MDM   Final diagnoses:  Alcohol intoxication, with unspecified complication (HCC)  Suicidal ideation    Patient with SI and ETOH intoxication.  Labs are reassuring except for ETOH level of 420.  Will reassess when more sober.  12:51 AM Patient reassessed still has some concern about SI.  Will consult TTS.  Patient signed out to [place], PA-C, who will continue care.    Roxy Horseman, PA-C 02/13/16 4098  Lavera Guise, MD 02/13/16 1240

## 2016-02-12 NOTE — ED Notes (Signed)
Bed: WA08 Expected date:  Expected time:  Means of arrival:  Comments: EMS ETOH 

## 2016-02-12 NOTE — ED Notes (Signed)
Pt here from home via EMS with c/o suicidal ideation with a plan to shoot or stab herself and alcohol intoxication. Per EMS pt drank about a case of beer.

## 2016-02-12 NOTE — ED Notes (Signed)
Called patient, no answer 

## 2016-02-12 NOTE — ED Notes (Addendum)
Delay in lab draw,  Pt not in room 

## 2016-02-12 NOTE — ED Notes (Signed)
Pt refuses vital signs. States, "no I dont want you to touch me"

## 2016-02-13 ENCOUNTER — Encounter (HOSPITAL_COMMUNITY): Payer: Self-pay | Admitting: Internal Medicine

## 2016-02-13 DIAGNOSIS — F10229 Alcohol dependence with intoxication, unspecified: Secondary | ICD-10-CM | POA: Diagnosis present

## 2016-02-13 DIAGNOSIS — R718 Other abnormality of red blood cells: Secondary | ICD-10-CM | POA: Diagnosis present

## 2016-02-13 DIAGNOSIS — Z6841 Body Mass Index (BMI) 40.0 and over, adult: Secondary | ICD-10-CM | POA: Diagnosis not present

## 2016-02-13 DIAGNOSIS — R000 Tachycardia, unspecified: Secondary | ICD-10-CM | POA: Diagnosis present

## 2016-02-13 DIAGNOSIS — F10230 Alcohol dependence with withdrawal, uncomplicated: Secondary | ICD-10-CM

## 2016-02-13 DIAGNOSIS — R45851 Suicidal ideations: Secondary | ICD-10-CM | POA: Diagnosis not present

## 2016-02-13 DIAGNOSIS — Y908 Blood alcohol level of 240 mg/100 ml or more: Secondary | ICD-10-CM | POA: Diagnosis present

## 2016-02-13 DIAGNOSIS — E785 Hyperlipidemia, unspecified: Secondary | ICD-10-CM | POA: Diagnosis present

## 2016-02-13 DIAGNOSIS — Z8249 Family history of ischemic heart disease and other diseases of the circulatory system: Secondary | ICD-10-CM | POA: Diagnosis not present

## 2016-02-13 DIAGNOSIS — I10 Essential (primary) hypertension: Secondary | ICD-10-CM | POA: Diagnosis present

## 2016-02-13 DIAGNOSIS — D696 Thrombocytopenia, unspecified: Secondary | ICD-10-CM | POA: Diagnosis present

## 2016-02-13 DIAGNOSIS — K760 Fatty (change of) liver, not elsewhere classified: Secondary | ICD-10-CM | POA: Diagnosis present

## 2016-02-13 DIAGNOSIS — F1024 Alcohol dependence with alcohol-induced mood disorder: Secondary | ICD-10-CM | POA: Diagnosis present

## 2016-02-13 DIAGNOSIS — R7989 Other specified abnormal findings of blood chemistry: Secondary | ICD-10-CM | POA: Diagnosis present

## 2016-02-13 DIAGNOSIS — F10239 Alcohol dependence with withdrawal, unspecified: Secondary | ICD-10-CM | POA: Diagnosis present

## 2016-02-13 DIAGNOSIS — E876 Hypokalemia: Secondary | ICD-10-CM | POA: Diagnosis present

## 2016-02-13 DIAGNOSIS — F10939 Alcohol use, unspecified with withdrawal, unspecified: Secondary | ICD-10-CM | POA: Diagnosis present

## 2016-02-13 DIAGNOSIS — E7800 Pure hypercholesterolemia, unspecified: Secondary | ICD-10-CM | POA: Diagnosis present

## 2016-02-13 DIAGNOSIS — F329 Major depressive disorder, single episode, unspecified: Secondary | ICD-10-CM | POA: Diagnosis present

## 2016-02-13 DIAGNOSIS — Z79899 Other long term (current) drug therapy: Secondary | ICD-10-CM | POA: Diagnosis not present

## 2016-02-13 DIAGNOSIS — Z833 Family history of diabetes mellitus: Secondary | ICD-10-CM | POA: Diagnosis not present

## 2016-02-13 DIAGNOSIS — F419 Anxiety disorder, unspecified: Secondary | ICD-10-CM | POA: Diagnosis present

## 2016-02-13 LAB — MAGNESIUM: MAGNESIUM: 1.4 mg/dL — AB (ref 1.7–2.4)

## 2016-02-13 LAB — COMPREHENSIVE METABOLIC PANEL
ALBUMIN: 3.4 g/dL — AB (ref 3.5–5.0)
ALK PHOS: 89 U/L (ref 38–126)
ALT: 43 U/L (ref 14–54)
AST: 116 U/L — AB (ref 15–41)
Anion gap: 10 (ref 5–15)
BUN: 6 mg/dL (ref 6–20)
CALCIUM: 8.4 mg/dL — AB (ref 8.9–10.3)
CHLORIDE: 101 mmol/L (ref 101–111)
CO2: 26 mmol/L (ref 22–32)
CREATININE: 0.73 mg/dL (ref 0.44–1.00)
GFR calc Af Amer: >60 mL/min (ref >60)
GFR calc non Af Amer: >60 mL/min (ref >60)
GLUCOSE: 147 mg/dL — AB (ref 65–99)
Potassium: 3.7 mmol/L (ref 3.5–5.1)
SODIUM: 137 mmol/L (ref 135–145)
Total Bilirubin: 1 mg/dL (ref 0.3–1.2)
Total Protein: 7.5 g/dL (ref 6.5–8.1)

## 2016-02-13 LAB — PHOSPHORUS: PHOSPHORUS: 1.9 mg/dL — AB (ref 2.5–4.6)

## 2016-02-13 LAB — ETHANOL
ALCOHOL ETHYL (B): 274 mg/dL — AB (ref <5)
Alcohol, Ethyl (B): 179 mg/dL — ABNORMAL HIGH (ref <5)
Alcohol, Ethyl (B): <5 mg/dL (ref <5)

## 2016-02-13 LAB — MRSA PCR SCREENING: MRSA by PCR: NEGATIVE

## 2016-02-13 MED ORDER — FAMOTIDINE IN NACL 20-0.9 MG/50ML-% IV SOLN
20.0000 mg | Freq: Once | INTRAVENOUS | Status: AC
Start: 2016-02-13 — End: 2016-02-13
  Administered 2016-02-13: 20 mg via INTRAVENOUS
  Filled 2016-02-13: qty 50

## 2016-02-13 MED ORDER — LORAZEPAM 1 MG PO TABS
0.0000 mg | ORAL_TABLET | Freq: Four times a day (QID) | ORAL | Status: DC
  Administered 2016-02-13: 2 mg via ORAL
  Administered 2016-02-13: 1 mg via ORAL
  Administered 2016-02-14: 2 mg via ORAL
  Administered 2016-02-14 (×2): 1 mg via ORAL
  Filled 2016-02-13 (×2): qty 2
  Filled 2016-02-13 (×3): qty 1

## 2016-02-13 MED ORDER — METOPROLOL TARTRATE 25 MG PO TABS
25.0000 mg | ORAL_TABLET | Freq: Two times a day (BID) | ORAL | Status: DC
  Administered 2016-02-13 – 2016-02-15 (×4): 25 mg via ORAL
  Filled 2016-02-13 (×4): qty 1

## 2016-02-13 MED ORDER — LORAZEPAM 2 MG/ML IJ SOLN
1.0000 mg | Freq: Once | INTRAMUSCULAR | Status: AC
  Administered 2016-02-13: 1 mg via INTRAVENOUS
  Filled 2016-02-13: qty 1

## 2016-02-13 MED ORDER — LORAZEPAM 1 MG PO TABS
2.0000 mg | ORAL_TABLET | ORAL | Status: DC | PRN
  Administered 2016-02-13 – 2016-02-14 (×3): 2 mg via ORAL
  Filled 2016-02-13 (×3): qty 2

## 2016-02-13 MED ORDER — POTASSIUM CHLORIDE IN NACL 20-0.9 MEQ/L-% IV SOLN
INTRAVENOUS | Status: DC
  Administered 2016-02-13 – 2016-02-15 (×3): via INTRAVENOUS
  Filled 2016-02-13 (×7): qty 1000

## 2016-02-13 MED ORDER — ONDANSETRON HCL 4 MG/2ML IJ SOLN: 4.0000 mg | Freq: Four times a day (QID) | INTRAMUSCULAR | Status: DC | PRN

## 2016-02-13 MED ORDER — ONDANSETRON HCL 4 MG PO TABS: 4.0000 mg | ORAL_TABLET | Freq: Four times a day (QID) | ORAL | Status: DC | PRN

## 2016-02-13 MED ORDER — ONDANSETRON HCL 4 MG/2ML IJ SOLN
4.0000 mg | Freq: Once | INTRAMUSCULAR | Status: AC
  Administered 2016-02-13: 4 mg via INTRAVENOUS

## 2016-02-13 MED ORDER — SODIUM CHLORIDE 0.9 % IV BOLUS (SEPSIS)
1000.0000 mL | Freq: Once | INTRAVENOUS | Status: AC
  Administered 2016-02-13: 1000 mL via INTRAVENOUS

## 2016-02-13 MED ORDER — LORAZEPAM 1 MG PO TABS: 0.0000 mg | ORAL_TABLET | Freq: Two times a day (BID) | ORAL | Status: DC

## 2016-02-13 MED ORDER — LORAZEPAM 2 MG/ML IJ SOLN
2.0000 mg | Freq: Once | INTRAMUSCULAR | Status: AC
  Administered 2016-02-13: 2 mg via INTRAVENOUS
  Filled 2016-02-13: qty 1

## 2016-02-13 MED ORDER — ACETAMINOPHEN 325 MG PO TABS
650.0000 mg | ORAL_TABLET | ORAL | Status: DC | PRN
  Administered 2016-02-13 – 2016-02-15 (×3): 650 mg via ORAL
  Filled 2016-02-13 (×3): qty 2

## 2016-02-13 MED ORDER — SODIUM CHLORIDE 0.9% FLUSH
3.0000 mL | Freq: Two times a day (BID) | INTRAVENOUS | Status: DC
  Administered 2016-02-13: 3 mL via INTRAVENOUS

## 2016-02-13 MED ORDER — THIAMINE HCL 100 MG/ML IJ SOLN
100.0000 mg | Freq: Every day | INTRAMUSCULAR | Status: DC
  Filled 2016-02-13: qty 2

## 2016-02-13 MED ORDER — KCL IN DEXTROSE-NACL 20-5-0.9 MEQ/L-%-% IV SOLN
INTRAVENOUS | Status: DC
  Administered 2016-02-13: 17:00:00 via INTRAVENOUS
  Filled 2016-02-13 (×2): qty 1000

## 2016-02-13 MED ORDER — METOPROLOL TARTRATE 1 MG/ML IV SOLN
5.0000 mg | Freq: Once | INTRAVENOUS | Status: AC
  Administered 2016-02-13: 5 mg via INTRAVENOUS
  Filled 2016-02-13: qty 5

## 2016-02-13 MED ORDER — VITAMIN B-1 100 MG PO TABS
100.0000 mg | ORAL_TABLET | Freq: Every day | ORAL | Status: DC
  Administered 2016-02-13 – 2016-02-15 (×3): 100 mg via ORAL
  Filled 2016-02-13 (×2): qty 1

## 2016-02-13 NOTE — ED Provider Notes (Signed)
Alcohol intoxication, passive SI. Has been allowed to sober and continues to endorse suicidal thoughts. TTS consultation requested, but alcohol over 400 and may be delayed.   1:45 am - Per Guadalupe Dawn with TTS, patient meets inpatient criteria but alcohol level will have to be less than 200 prior to placement. She will monitor and seek placement when alcohol level drops.   Elpidio Anis, PA-C 02/14/16 1610  Raeford Razor, MD 02/17/16 [phone]

## 2016-02-13 NOTE — Progress Notes (Signed)
Disposition CSW completed patient referrals to the following inpatient psych facilities:  [facility]  CSW will continue to follow patient for placement needs.  [facility] Disposition CSW [phone]

## 2016-02-13 NOTE — ED Notes (Signed)
Dr Bebe Shaggy updated

## 2016-02-13 NOTE — ED Notes (Signed)
Pt ambulatory w/o difficulty from ED, to the bathroom on arrival

## 2016-02-13 NOTE — H&P (Signed)
[facility] History and Physical  Tiffany Howell:[id number] DOB: [birth year] DOA: 02/12/2016  Referring physician: Zadie Rhine, M.D. PCP: Galvin Proffer, MD   Chief Complaint: Suicidal ideations  HPI: Tiffany Howell is a 45 y.o. female with a past medical history of hypertension, hyperlipidemia, anxiety, depression who came in last night at around 2000 due to having suicidal ideations and was also found to be intoxicated with alcohol.  Per patient, she intended to commit suicide by stabbing or shooting herself with a firearm. She was also intoxicated with alcohol and drinks about a 12 pack of beer a day. TTS was called for evaluation, but did not evaluate due to the patient having an initial alcohol level over 400 mg/dL. She currently denies suicidal thoughts, but states that these ideations come and go. She is very anxious, her speech is pressured and she is tachycardic. We were called to admit for alcohol withdrawal syndrome.   Review of Systems:  Unable to fully review due to the patient's anxiety and tremors.  Past Medical History  Diagnosis Date  . Hypertension   . Anxiety   . Hypercholesteremia    History reviewed. No pertinent past surgical history. Social History:  reports that she has never smoked. She does not have any smokeless tobacco history on file. She reports that she drinks alcohol. She reports that she does not use illicit drugs.  No Known Allergies  Family History  Problem Relation Age of Onset  . Diabetes Mellitus II Mother   . Hypertension Mother   . Heart failure Mother     Prior to Admission medications   Medication Sig Start Date End Date Taking? Authorizing Provider  ALPRAZolam Prudy Feeler) 1 MG tablet Take 1 mg by mouth 3 (three) times daily as needed for anxiety.   Yes Historical Provider, MD  citalopram (CELEXA) 40 MG tablet Take 40 mg by mouth daily. 06/10/15  Yes Historical Provider, MD  folic acid (FOLVITE) 1 MG tablet Take 1  tablet (1 mg total) by mouth daily. 02/07/15  Yes Shanker Levora Dredge, MD  HYDROcodone-acetaminophen (NORCO) 7.5-325 MG per tablet Take 1 tablet by mouth every 12 (twelve) hours as needed for moderate pain.   Yes Historical Provider, MD  ibuprofen (ADVIL,MOTRIN) 200 MG tablet Take 600 mg by mouth every 6 (six) hours as needed for moderate pain.   Yes Historical Provider, MD  lisinopril (PRINIVIL,ZESTRIL) 10 MG tablet TAKE 1 TABLET EVERY DAY AND STOP THE LISINOPRIL/HCTZ 06/24/15  Yes Historical Provider, MD  Multiple Vitamin (MULTIVITAMIN WITH MINERALS) TABS tablet Take 1 tablet by mouth daily. 02/07/15  Yes Shanker Levora Dredge, MD  rosuvastatin (CRESTOR) 5 MG tablet Take 5 mg by mouth daily.   Yes Historical Provider, MD  thiamine 100 MG tablet Take 1 tablet (100 mg total) by mouth daily. 02/07/15  Yes Shanker Levora Dredge, MD  atenolol (TENORMIN) 25 MG tablet Take 1 tablet (25 mg total) by mouth daily. Patient not taking: Reported on 07/10/2015 02/07/15   Maretta Bees, MD  cyclobenzaprine (FLEXERIL) 5 MG tablet Take 1 tablet (5 mg total) by mouth 3 (three) times daily as needed for muscle spasms. Patient not taking: Reported on 02/12/2016 07/30/15   Teressa Lower, NP  HYDROcodone-acetaminophen (NORCO/VICODIN) 5-325 MG per tablet Take 1 tablet by mouth every 4 (four) hours as needed. Patient not taking: Reported on 02/12/2016 07/30/15   Teressa Lower, NP  LORazepam (ATIVAN) 1 MG tablet Take 1 mg po twice daily for 2 day, then, Take  1 mg po daily for 1 day, then, Take 1 mg po daily as needed Patient not taking: Reported on 07/10/2015 02/07/15   Maretta Bees, MD  naphazoline (NAPHCON) 0.1 % ophthalmic solution Place 2 drops into both eyes 4 (four) times daily as needed for irritation or allergies. Patient not taking: Reported on 07/10/2015 02/07/15   Maretta Bees, MD   Physical Exam: Filed Vitals:   02/13/16 0725 02/13/16 0904 02/13/16 1009 02/13/16 1142  BP: 146/68 137/88  153/80  Pulse: 124 131  126 134  Temp: 98.3 F (36.8 C) 98.7 F (37.1 C)  98.8 F (37.1 C)  TempSrc: Oral Oral  Oral  Resp: SpO2: 96% 94%  93%    Wt Readings from Last 3 Encounters:  07/30/15 86.183 kg (190 lb)  11/15/12 90.719 kg (200 lb)    General:  Appears very anxious and tremulous. Eyes: PERRL, normal lids, irises & conjunctiva ENT: grossly normal hearing, lips & tongue Neck: no LAD, masses or thyromegaly Cardiovascular: Tachycardic at 124 BPM, no m/r/g. No LE edema. Telemetry: Sinus tachycardia. Respiratory: CTA bilaterally, no w/r/r. Normal respiratory effort. Abdomen: soft, ntnd Skin: no rash or induration seen on limited exam Musculoskeletal: grossly normal tone BUE/BLE Psychiatric: Depressed and anxious. Speech is pressured. She was feeling suicidal last                     night, but is now feeling suicidal at the time of my evaluation. Neurologic: Very tremulous, awake, alert, oriented 3, otherwise grossly non-focal.          Labs on Admission:  Basic Metabolic Panel:  Recent Labs Lab 02/12/16 2049  NA 142  K 3.8  CL 102  CO2 29  GLUCOSE 94  BUN <5*  CREATININE 0.59  CALCIUM 8.7*   Liver Function Tests:  Recent Labs Lab 02/12/16 2049  AST 143*  ALT 50  ALKPHOS 94  BILITOT 1.1  PROT 8.4*  ALBUMIN 3.9   CBC:  Recent Labs Lab 02/12/16 2049  WBC 9.2  HGB 14.5  HCT 45.1  MCV 106.4*  PLT 149*    Assessment/Plan Principal Problem:   Alcohol withdrawal syndrome (HCC) Admit to a stepdown. Continue IV hydration. Start thiamine, folate, and VI supplementation. Magnesium sulfate IV piggyback. Start CiWA protocol Consider psychiatry evaluation once the patient is less anxious.  Active Problems:   Hypertension Not taking atenolol at this time. Start metoprolol 25 mg by mouth twice a day. Patient advised to follow-up with her primary care doctor regularly 2 out for long-term complications of uncontrolled hypertension.    Elevated LFTs Secondary  to alcohol abuse.. Monitor LFTs.    Hyperlipidemia Currently not on treatment, to be considered as an outpatient if the patient has a sustained period of sobriety.    Elevated MCV Check vitamin B12 and folic acid levels.    Suicidal ideations Psych evaluation once withdrawal symptoms have been controlled.    Sinus tachycardia (HCC) Continue IV hydration. Continue delirium tremens treatment. Resume beta blocker antihypertensive therapy. Optimize potassium and magnesium.    Code Status: Full code. DVT Prophylaxis: Mechanical with SCDs. Family Communication:  Disposition Plan: Admit to a stepdown, continue CiWA protocol, continue suicide precautions.  Time spent: Over 70 minutes were used in the process of this admission.  Bobette Mo, M.D. [facility] Pager [phone].

## 2016-02-13 NOTE — ED Notes (Signed)
Belongings moved to locker #33

## 2016-02-13 NOTE — ED Notes (Signed)
Pt ambulatory w/o difficulty to room 12 for further eval

## 2016-02-13 NOTE — ED Notes (Signed)
Pt ambulatory to room 33, no s/s of distress.

## 2016-02-13 NOTE — ED Notes (Signed)
Charge nurse aware of VS, move pt to room 12 for further eval.

## 2016-02-13 NOTE — ED Provider Notes (Signed)
Pt was moved back to the ED for tachycardia and tremor Pt reports HA but has no other complaints She is tachycardic Tremor noted Will order IV fluids and ativan   Zadie Rhine, MD 02/13/16 [phone]

## 2016-02-13 NOTE — Progress Notes (Signed)
eLink Physician-Brief Progress Note Patient Name: Tiffany Howell DOB: 11-Jan-1971 MRN: [id number]   Date of Service  02/13/2016  HPI/Events of Note  Notified of need for DVT prophylaxis.   eICU Interventions  Will order SCD's placed to bilateral lower extremities.      Intervention Category Intermediate Interventions: Best-practice therapies (e.g. DVT, beta blocker, etc.)  Kathia Covington [place] 02/13/2016, 6:59 PM

## 2016-02-13 NOTE — BH Assessment (Signed)
Assessment completed. Consulted Alberteen Sam, NP who recommended inpatient treatment. TTS to seek placement when pt's BAL is below 200. Informed Elpidio Anis, PA-C of the recommendation.

## 2016-02-13 NOTE — ED Notes (Addendum)
As I write this she has just been brought to my room (12) and is ambulatory and in no distress.  She denies s.i./h.i. And thanks Wille Celeste for her care in our [place].  She is pleasant and cooperative.  Also, she is drinking p.o. Fluids just fine.

## 2016-02-13 NOTE — ED Provider Notes (Signed)
Pt more awake/alert, but now tachycardic, tremulous/hypertensive Concern for ETOH withdrawal Not responding to IV fluids or ativan Will admit D/w dr Robb Matar, he will place order for stepdown bed  BP 153/80 mmHg  Pulse 134  Temp(Src) 98.8 F (37.1 C) (Oral)  Resp 20  SpO2 93%   CRITICAL CARE Performed by: Joya Gaskins Total critical care time: 35 minutes Critical care time was exclusive of separately billable procedures and treating other patients. Critical care was necessary to treat or prevent imminent or life-threatening deterioration. Critical care was time spent personally by me on the following activities: development of treatment plan with patient and/or surrogate as well as nursing, discussions with consultants, evaluation of patient's response to treatment, examination of patient, obtaining history from patient or surrogate, ordering and performing treatments and interventions, ordering and review of laboratory studies, ordering and review of radiographic studies, pulse oximetry and re-evaluation of patient's condition.   Zadie Rhine, MD 02/13/16 1326
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
## 2016-02-13 NOTE — ED Notes (Signed)
She is very shaky and in no distress.  She is quite lucid and is oriented x 4 with clear speech.

## 2016-02-14 DIAGNOSIS — R7989 Other specified abnormal findings of blood chemistry: Secondary | ICD-10-CM

## 2016-02-14 DIAGNOSIS — I10 Essential (primary) hypertension: Secondary | ICD-10-CM

## 2016-02-14 DIAGNOSIS — F10239 Alcohol dependence with withdrawal, unspecified: Principal | ICD-10-CM

## 2016-02-14 DIAGNOSIS — R45851 Suicidal ideations: Secondary | ICD-10-CM

## 2016-02-14 LAB — COMPREHENSIVE METABOLIC PANEL
ALK PHOS: 93 U/L (ref 38–126)
ALT: 40 U/L (ref 14–54)
AST: 100 U/L — ABNORMAL HIGH (ref 15–41)
Albumin: 3.6 g/dL (ref 3.5–5.0)
Anion gap: 11 (ref 5–15)
BUN: 6 mg/dL (ref 6–20)
CALCIUM: 8.5 mg/dL — AB (ref 8.9–10.3)
CO2: 24 mmol/L (ref 22–32)
CREATININE: 0.5 mg/dL (ref 0.44–1.00)
Chloride: 100 mmol/L — ABNORMAL LOW (ref 101–111)
Glucose, Bld: 109 mg/dL — ABNORMAL HIGH (ref 65–99)
Potassium: 3.3 mmol/L — ABNORMAL LOW (ref 3.5–5.1)
SODIUM: 135 mmol/L (ref 135–145)
Total Bilirubin: 1.8 mg/dL — ABNORMAL HIGH (ref 0.3–1.2)
Total Protein: 8 g/dL (ref 6.5–8.1)

## 2016-02-14 LAB — CBC WITH DIFFERENTIAL/PLATELET
Basophils Absolute: 0.1 10*3/uL (ref 0.0–0.1)
Basophils Absolute: 0 10*3/uL (ref 0.0–0.1)
Basophils Relative: 1 %
Basophils Relative: 0 %
EOS PCT: 4 %
Eosinophils Absolute: 0.2 10*3/uL (ref 0.0–0.7)
Eosinophils Absolute: 0.2 10*3/uL (ref 0.0–0.7)
Eosinophils Relative: 6 %
HCT: 43 % (ref 36.0–46.0)
HCT: 44.3 % (ref 36.0–46.0)
HEMOGLOBIN: 14.7 g/dL (ref 12.0–15.0)
HEMOGLOBIN: 14.5 g/dL (ref 12.0–15.0)
LYMPHS ABS: 1.1 10*3/uL (ref 0.7–4.0)
LYMPHS ABS: 0.9 10*3/uL (ref 0.7–4.0)
LYMPHS PCT: 21 %
LYMPHS PCT: 24 %
MCH: 35.4 pg — ABNORMAL HIGH (ref 26.0–34.0)
MCH: 34.5 pg — AB (ref 26.0–34.0)
MCHC: 34.2 g/dL (ref 30.0–36.0)
MCHC: 32.7 g/dL (ref 30.0–36.0)
MCV: 103.6 fL — ABNORMAL HIGH (ref 78.0–100.0)
MCV: 105.5 fL — AB (ref 78.0–100.0)
MONOS PCT: 11 %
Monocytes Absolute: 0.6 10*3/uL (ref 0.1–1.0)
Monocytes Absolute: 0.3 10*3/uL (ref 0.1–1.0)
Monocytes Relative: 8 %
NEUTROS ABS: 3.3 10*3/uL (ref 1.7–7.7)
NEUTROS ABS: 2.2 10*3/uL (ref 1.7–7.7)
NEUTROS PCT: 61 %
Neutrophils Relative %: 63 %
PLATELETS: 98 10*3/uL — AB (ref 150–400)
Platelets: 80 10*3/uL — ABNORMAL LOW (ref 150–400)
RBC: 4.15 MIL/uL (ref 3.87–5.11)
RBC: 4.2 MIL/uL (ref 3.87–5.11)
RDW: 12.2 % (ref 11.5–15.5)
RDW: 12.4 % (ref 11.5–15.5)
WBC: 5.3 10*3/uL (ref 4.0–10.5)
WBC: 3.6 10*3/uL — AB (ref 4.0–10.5)

## 2016-02-14 MED ORDER — LORAZEPAM 1 MG PO TABS
0.0000 mg | ORAL_TABLET | ORAL | Status: DC
  Administered 2016-02-14 – 2016-02-15 (×6): 2 mg via ORAL
  Filled 2016-02-14 (×6): qty 2

## 2016-02-14 MED ORDER — LORAZEPAM 2 MG/ML IJ SOLN: 0.0000 mg | INTRAMUSCULAR | Status: DC

## 2016-02-14 MED ORDER — LORAZEPAM 1 MG PO TABS: 0.0000 mg | ORAL_TABLET | Freq: Three times a day (TID) | ORAL | Status: DC

## 2016-02-14 MED ORDER — LORAZEPAM 1 MG PO TABS: 1.0000 mg | ORAL_TABLET | Freq: Four times a day (QID) | ORAL | Status: DC | PRN

## 2016-02-14 MED ORDER — POTASSIUM CHLORIDE CRYS ER 20 MEQ PO TBCR
40.0000 meq | EXTENDED_RELEASE_TABLET | Freq: Once | ORAL | Status: AC
  Administered 2016-02-14: 40 meq via ORAL
  Filled 2016-02-14: qty 2

## 2016-02-14 MED ORDER — LORAZEPAM 2 MG/ML IJ SOLN
0.0000 mg | INTRAMUSCULAR | Status: DC
Start: 2016-02-14 — End: 2016-02-14

## 2016-02-14 MED ORDER — POTASSIUM CHLORIDE CRYS ER 20 MEQ PO TBCR: 40.0000 meq | EXTENDED_RELEASE_TABLET | Freq: Once | ORAL | Status: DC

## 2016-02-14 MED ORDER — LORAZEPAM 2 MG/ML IJ SOLN: 1.0000 mg | INTRAMUSCULAR | Status: DC

## 2016-02-14 MED ORDER — LORAZEPAM 1 MG PO TABS: 1.0000 mg | ORAL_TABLET | ORAL | Status: DC

## 2016-02-14 MED ORDER — POTASSIUM PHOSPHATES 15 MMOLE/5ML IV SOLN
30.0000 meq | Freq: Once | INTRAVENOUS | Status: AC
  Administered 2016-02-14: 30 meq via INTRAVENOUS
  Filled 2016-02-14: qty 6.82

## 2016-02-14 MED ORDER — MAGNESIUM SULFATE 2 GM/50ML IV SOLN
2.0000 g | Freq: Once | INTRAVENOUS | Status: AC
  Administered 2016-02-14: 2 g via INTRAVENOUS
  Filled 2016-02-14: qty 50

## 2016-02-14 MED ORDER — LORAZEPAM 1 MG PO TABS: 0.0000 mg | ORAL_TABLET | ORAL | Status: DC | PRN

## 2016-02-14 MED ORDER — LORAZEPAM 1 MG PO TABS: 0.0000 mg | ORAL_TABLET | ORAL | Status: DC

## 2016-02-14 MED ORDER — LORAZEPAM 2 MG/ML IJ SOLN: 1.0000 mg | Freq: Four times a day (QID) | INTRAMUSCULAR | Status: DC | PRN

## 2016-02-14 MED ORDER — POTASSIUM CHLORIDE CRYS ER 20 MEQ PO TBCR
20.0000 meq | EXTENDED_RELEASE_TABLET | Freq: Once | ORAL | Status: AC
  Administered 2016-02-14: 20 meq via ORAL
  Filled 2016-02-14: qty 1

## 2016-02-14 NOTE — Progress Notes (Addendum)
Patient Demographics  Tiffany Howell, is a 45 y.o. female, DOB - [birth year], WJX:[id number]  Admit date - 02/12/2016   Admitting Physician Tiffany Mo, MD  Outpatient Primary MD for the patient is [name], [name], MD  LOS - 1   Chief Complaint  Patient presents with  . Suicidal  . Alcohol Intoxication       Admission HPI/Brief narrative: 45 y.o. female with a past medical history of hypertension, hyperlipidemia, anxiety, depression presents with suicidal ideations and was also found to be intoxicated with alcohol. Subjective:   Tiffany Howell today has,  No chest pain, No abdominal pain - No Nausea, complaints of headache . Assessment & Plan    Principal Problem:   Alcohol withdrawal syndrome (HCC) Active Problems:   Hypertension   Elevated LFTs   Hyperlipidemia   Elevated MCV   Suicidal ideations   Sinus tachycardia (HCC)  Alcohol abuse /alcohol withdrawal syndrome - Patient was counseled, started on CIWA protocol  - Continue with thiamine, folic acid and multivitamin  Hypertension - Started on metoprolol  Elevated LFTs - Secondary to alcohol abuse  Thrombocytopenia - Secondary to alcohol abuse, SCDs for DVT prophylaxis  Suicidal ideation - Continue suicide precaution, psychiatric consulted  Hypokalemia - Repleted, recheck in a.m.  Code Status: Full  Family Communication: None at bedside  Disposition Plan: Pending psych evaluation   Procedures  None   Consults   Psychiatric   Medications  Scheduled Meds: . LORazepam  0-4 mg Oral 4 times per day   Followed by  . [START ON 02/15/2016] LORazepam  0-4 mg Oral Q12H  . metoprolol tartrate  25 mg Oral BID  . sodium chloride flush  3 mL Intravenous Q12H  . thiamine  100 mg Oral Daily   Or  . thiamine  100 mg Intravenous Daily   Continuous Infusions: . 0.9 % NaCl with KCl 20 mEq / L 100 mL/hr at  02/14/16 0556   PRN Meds:.acetaminophen, LORazepam, ondansetron **OR** ondansetron (ZOFRAN) IV, ondansetron (ZOFRAN) IV  DVT Prophylaxis SCDs  Lab Results  Component Value Date   PLT 80* 02/14/2016    Antibiotics   Anti-infectives    None          Objective:   Filed Vitals:   02/14/16 0200 02/14/16 0300 02/14/16 0412 02/14/16 1619  BP:   155/88 158/106  Pulse: 86 90 89 100  Temp:   97.6 F (36.4 C) 99.1 F (37.3 C)  TempSrc:   Oral Oral  Resp: Height:      Weight:      SpO2: 94% 94% 99% 99%    Wt Readings from Last 3 Encounters:  02/13/16 104 kg (229 lb 4.5 oz)  07/30/15 86.183 kg (190 lb)  11/15/12 90.719 kg (200 lb)     Intake/Output Summary (Last 24 hours) at 02/14/16 1623 Last data filed at 02/14/16 0559  Gross per 24 hour  Intake 2161.82 ml  Output   1350 ml  Net 811.82 ml     Physical Exam  Awake Alert, Oriented X 3,  NCT,PERRAL Supple Neck,No JVD,  Symmetrical Chest wall movement, Good air movement bilaterally, CTAB RRR,No Gallops,Rubs or new Murmurs, No Parasternal Heave +ve B.Sounds, Abd Soft,  No tenderness, No organomegaly appriciated, No rebound - guarding or rigidity. No Cyanosis, Clubbing or edema, No new Rash or bruise  , hand  tremors +  Data Review   Micro Results Recent Results (from the past 240 hour(s))  MRSA PCR Screening     Status: None   Collection Time: 02/13/16  4:23 PM  Result Value Ref Range Status   MRSA by PCR NEGATIVE NEGATIVE Final    Comment:        The GeneXpert MRSA Assay (FDA approved for NASAL specimens only), is one component of a comprehensive MRSA colonization surveillance program. It is not intended to diagnose MRSA infection nor to guide or monitor treatment for MRSA infections.     Radiology Reports No results found.   CBC  Recent Labs Lab 02/12/16 2049 02/13/16 1437 02/14/16 0324  WBC 9.2 5.3 3.6*  HGB 14.5 14.7 14.5  HCT 45.1 43.0 44.3  PLT 149* 98* 80*  MCV  106.4* 103.6* 105.5*  MCH 34.2* 35.4* 34.5*  MCHC 32.2 34.2 32.7  RDW 12.8 12.2 12.4  LYMPHSABS  --  1.1 0.9  MONOABS  --  0.6 0.3  EOSABS  --  0.2 0.2  BASOSABS  --  0.1 0.0    Chemistries   Recent Labs Lab 02/12/16 2049 02/13/16 1437 02/14/16 0515  NA 142 137 135  K 3.8 3.7 3.3*  CL 102 101 100*  CO2 GLUCOSE 94 147* 109*  BUN <5* 6 6  CREATININE 0.59 0.73 0.50  CALCIUM 8.7* 8.4* 8.5*  MG  --  1.4*  --   AST 143* 116* 100*  ALT 50 43 40  ALKPHOS 94 89 93  BILITOT 1.1 1.0 1.8*   ------------------------------------------------------------------------------------------------------------------ estimated creatinine clearance is 101.3 mL/min (by C-G formula based on Cr of 0.5). ------------------------------------------------------------------------------------------------------------------ No results for input(s): HGBA1C in the last 72 hours. ------------------------------------------------------------------------------------------------------------------ No results for input(s): CHOL, HDL, LDLCALC, TRIG, CHOLHDL, LDLDIRECT in the last 72 hours. ------------------------------------------------------------------------------------------------------------------ No results for input(s): TSH, T4TOTAL, T3FREE, THYROIDAB in the last 72 hours.  Invalid input(s): FREET3 ------------------------------------------------------------------------------------------------------------------ No results for input(s): VITAMINB12, FOLATE, FERRITIN, TIBC, IRON, RETICCTPCT in the last 72 hours.  Coagulation profile No results for input(s): INR, PROTIME in the last 168 hours.  No results for input(s): DDIMER in the last 72 hours.  Cardiac Enzymes No results for input(s): CKMB, TROPONINI, MYOGLOBIN in the last 168 hours.  Invalid input(s): CK ------------------------------------------------------------------------------------------------------------------ Invalid input(s):  POCBNP     Time Spent in minutes   25 minutes   Cieanna Stormes M.D on 02/14/2016 at 4:23 PM  Between 7am to 7pm - Pager - [phone]  After 7pm go to [email or web address] - password [facility]  [facility]   Office  [phone]

## 2016-02-14 NOTE — Consult Note (Signed)
Seen Tiffany Howell   Reason for Howell:  Alcohol induced mood disorder with suicide ideation without plan Referring Physician:  Dr. Waldron Labs Patient Identification: Tiffany Howell MRN:  [id number] Principal Diagnosis: Alcohol withdrawal syndrome [facility]) Diagnosis:   Patient Active Problem List   Diagnosis Date Noted  . Alcohol withdrawal syndrome ([place]) [F10.239] 02/13/2016  . Elevated MCV [R71.8] 02/13/2016  . Suicidal ideations [R45.851] 02/13/2016  . Sinus tachycardia ([place]) [R00.0] 02/13/2016  . Elevated lactic acid level [E87.2] 02/04/2015  . Nausea vomiting and diarrhea [R11.2, R19.7] 02/04/2015  . Abdominal pain [R10.9] 02/04/2015  . Dizziness [R42] 02/04/2015  . Hypertension [I10] 02/04/2015  . Elevated LFTs [R79.89] 02/04/2015  . Hyperlipidemia [E78.5] 02/04/2015    Total Time spent with patient: 1 hour  Subjective:   Tiffany Howell is a 45 y.o. female patient admitted with alcohol withdrawal, depression and SI.  HPI:  Tiffany Howell is a 45 y.o. female seen, chart reviewed for face-to-face psychiatric consultation and evaluation of alcohol abuse versus dependence, intoxication, depression and suicidal ideation. Patient reported she has been drinking heavily because he relapsed on recently. Patient reported she has been suffering with a major depressive disorder, anxiety disorder, multiple medical problems along with fatty liver. Patient reportedly drinks 12 beers daily for the last 3 years. Patient reported she has been under care of Dr. Jimmye Norman at [facility] family medicine but decided to stop her medication because she wanted to try natural remedies. Patient is willing to participate in outpatient psychiatric services in [place] when medically cleared. Patient also reportedly involved with Alcoholics Anonymous and Narcotics Anonymous groups in [place] patient reportedly received substance abuse rehabilitation a year ago in [place]. Patient  endorses her depression is 5 out of 10 anxiety 6 out of 10 but denied active suicidal/homicidal ideation, intention or plans. Patient has no evidence of psychosis. Patient blood alcohol level on arrival is 420 mg/dL  Past Psychiatric History: She had [facility] admission for alcohol detox treatment several years ago.  Risk to Self: Suicidal Ideation: Yes-Currently Present Suicidal Intent: No Is patient at risk for suicide?: Yes Suicidal Plan?: No Access to Means: No What has been your use of drugs/alcohol within the last 12 months?: Daily alcohol use reported.  How many times?: 0 Other Self Harm Risks: PT denies  Triggers for Past Attempts: None known Intentional Self Injurious Behavior: None Risk to Others: Homicidal Ideation: No Thoughts of Harm to Others: No Current Homicidal Intent: No Current Homicidal Plan: No Access to Homicidal Means: No Identified Victim: N/A History of harm to others?: No Assessment of Violence: None Noted Violent Behavior Description: No violent behaviors observed. Pt is calm and cooperative  Does patient have access to weapons?: No Criminal Charges Pending?: No Does patient have a court date: No Prior Inpatient Therapy: Prior Inpatient Therapy: Yes Prior Therapy Dates: 2003, 2009, 2010 Prior Therapy Facilty/Provider(s): [facility], [place], [place] Reason for Treatment: alcohol abuse  Prior Outpatient Therapy: Prior Outpatient Therapy: Yes Prior Therapy Dates: 2001 Prior Therapy Facilty/Provider(s): [facility]  Reason for Treatment: alcohol use.  Does patient have an ACCT team?: No Does patient have Intensive In-House Services?  : No Does patient have [facility] services? : No Does patient have P4CC services?: No  Past Medical History:  Past Medical History  Diagnosis Date  . Hypertension   . Anxiety   . Hypercholesteremia   . Depression   . Fatty liver    History reviewed. No pertinent past surgical history. Family History:  Family  History  Problem  Relation Age of Onset  . Diabetes Mellitus II Mother   . Hypertension Mother   . Heart failure Mother    Family Psychiatric  History: Significant for depression and anxiety in her mother who has been taking medication. Social History:  History  Alcohol Use  . Yes    Comment: 12 pack today     History  Drug Use No    Social History   Social History  . Marital Status: Single    Spouse Name: N/A  . Number of Children: N/A  . Years of Education: N/A   Social History Main Topics  . Smoking status: Never Smoker   . Smokeless tobacco: None  . Alcohol Use: Yes     Comment: 12 pack today  . Drug Use: No  . Sexual Activity: Yes    Birth Control/ Protection: IUD   Other Topics Concern  . None   Social History Narrative   Additional Social History:    Allergies:  No Known Allergies  Labs:  Results for orders placed or performed during the hospital encounter of 02/12/16 (from the past 48 hour(s))  Urine rapid drug screen (hosp performed) (Not at [facility])     Status: None   Collection Time: 02/12/16  8:05 PM  Result Value Ref Range   Opiates NONE DETECTED NONE DETECTED   Cocaine NONE DETECTED NONE DETECTED   Benzodiazepines NONE DETECTED NONE DETECTED   Amphetamines NONE DETECTED NONE DETECTED   Tetrahydrocannabinol NONE DETECTED NONE DETECTED   Barbiturates NONE DETECTED NONE DETECTED    Comment:        DRUG SCREEN FOR MEDICAL PURPOSES ONLY.  IF CONFIRMATION IS NEEDED FOR ANY PURPOSE, NOTIFY LAB WITHIN 5 DAYS.        LOWEST DETECTABLE LIMITS FOR URINE DRUG SCREEN Drug Class       Cutoff (ng/mL) Amphetamine      1000 Barbiturate      200 Benzodiazepine   229 Tricyclics       798 Opiates          300 Cocaine          300 THC              50   POC urine preg, ED (not at [facility])     Status: None   Collection Time: 02/12/16  8:10 PM  Result Value Ref Range   Preg Test, Ur NEGATIVE NEGATIVE    Comment:        THE SENSITIVITY OF THIS METHODOLOGY IS >24 mIU/mL    Comprehensive metabolic panel     Status: Abnormal   Collection Time: 02/12/16  8:49 PM  Result Value Ref Range   Sodium 142 135 - 145 mmol/L   Potassium 3.8 3.5 - 5.1 mmol/L   Chloride 102 101 - 111 mmol/L   CO2 29 22 - 32 mmol/L   Glucose, Bld 94 65 - 99 mg/dL   BUN <5 (L) 6 - 20 mg/dL   Creatinine, Ser 0.59 0.44 - 1.00 mg/dL   Calcium 8.7 (L) 8.9 - 10.3 mg/dL   Total Protein 8.4 (H) 6.5 - 8.1 g/dL   Albumin 3.9 3.5 - 5.0 g/dL   AST 143 (H) 15 - 41 U/L   ALT 50 14 - 54 U/L   Alkaline Phosphatase 94 38 - 126 U/L   Total Bilirubin 1.1 0.3 - 1.2 mg/dL   GFR calc non Af Amer >60 >60 mL/min   GFR calc Af  Amer >60 >60 mL/min    Comment: (NOTE) The eGFR has been calculated using the CKD EPI equation. This calculation has not been validated in all clinical situations. eGFR's persistently <60 mL/min signify possible Chronic Kidney Disease.    Anion gap 11 5 - 15  Ethanol (ETOH)     Status: Abnormal   Collection Time: 02/12/16  8:49 PM  Result Value Ref Range   Alcohol, Ethyl (B) 420 (HH) <5 mg/dL    Comment:        LOWEST DETECTABLE LIMIT FOR SERUM ALCOHOL IS 5 mg/dL FOR MEDICAL PURPOSES ONLY CRITICAL RESULT CALLED TO, READ BACK BY AND VERIFIED WITH: A DENNIS RN 2138 47/42/59 A NAVARRO   Salicylate level     Status: None   Collection Time: 02/12/16  8:49 PM  Result Value Ref Range   Salicylate Lvl <5.6 2.8 - 30.0 mg/dL  Acetaminophen level     Status: Abnormal   Collection Time: 02/12/16  8:49 PM  Result Value Ref Range   Acetaminophen (Tylenol), Serum <10 (L) 10 - 30 ug/mL    Comment:        THERAPEUTIC CONCENTRATIONS VARY SIGNIFICANTLY. A RANGE OF 10-30 ug/mL MAY BE AN EFFECTIVE CONCENTRATION FOR MANY PATIENTS. HOWEVER, SOME ARE BEST TREATED AT CONCENTRATIONS OUTSIDE THIS RANGE. ACETAMINOPHEN CONCENTRATIONS >150 ug/mL AT 4 HOURS AFTER INGESTION AND >50 ug/mL AT 12 HOURS AFTER INGESTION ARE OFTEN ASSOCIATED WITH TOXIC REACTIONS.   CBC     Status: Abnormal    Collection Time: 02/12/16  8:49 PM  Result Value Ref Range   WBC 9.2 4.0 - 10.5 K/uL   RBC 4.24 3.87 - 5.11 MIL/uL   Hemoglobin 14.5 12.0 - 15.0 g/dL   HCT 45.1 36.0 - 46.0 %   MCV 106.4 (H) 78.0 - 100.0 fL   MCH 34.2 (H) 26.0 - 34.0 pg   MCHC 32.2 30.0 - 36.0 g/dL   RDW 12.8 11.5 - 15.5 %   Platelets 149 (L) 150 - 400 K/uL  Ethanol     Status: Abnormal   Collection Time: 02/13/16  1:54 AM  Result Value Ref Range   Alcohol, Ethyl (B) 274 (H) <5 mg/dL    Comment:        LOWEST DETECTABLE LIMIT FOR SERUM ALCOHOL IS 5 mg/dL FOR MEDICAL PURPOSES ONLY   Ethanol     Status: Abnormal   Collection Time: 02/13/16  6:04 AM  Result Value Ref Range   Alcohol, Ethyl (B) 179 (H) <5 mg/dL    Comment:        LOWEST DETECTABLE LIMIT FOR SERUM ALCOHOL IS 5 mg/dL FOR MEDICAL PURPOSES ONLY   Magnesium     Status: Abnormal   Collection Time: 02/13/16  2:37 PM  Result Value Ref Range   Magnesium 1.4 (L) 1.7 - 2.4 mg/dL  Phosphorus     Status: Abnormal   Collection Time: 02/13/16  2:37 PM  Result Value Ref Range   Phosphorus 1.9 (L) 2.5 - 4.6 mg/dL  Ethanol     Status: None   Collection Time: 02/13/16  2:37 PM  Result Value Ref Range   Alcohol, Ethyl (B) <5 <5 mg/dL    Comment:        LOWEST DETECTABLE LIMIT FOR SERUM ALCOHOL IS 5 mg/dL FOR MEDICAL PURPOSES ONLY   CBC with Differential/Platelet     Status: Abnormal   Collection Time: 02/13/16  2:37 PM  Result Value Ref Range   WBC 5.3 4.0 - 10.5 K/uL  RBC 4.15 3.87 - 5.11 MIL/uL   Hemoglobin 14.7 12.0 - 15.0 g/dL   HCT 43.0 36.0 - 46.0 %   MCV 103.6 (H) 78.0 - 100.0 fL   MCH 35.4 (H) 26.0 - 34.0 pg   MCHC 34.2 30.0 - 36.0 g/dL   RDW 12.2 11.5 - 15.5 %   Platelets 98 (L) 150 - 400 K/uL    Comment: SPECIMEN CHECKED FOR CLOTS PLATELET COUNT CONFIRMED BY SMEAR DELTA CHECK NOTED    Neutrophils Relative % 63 %   Lymphocytes Relative 21 %   Monocytes Relative 11 %   Eosinophils Relative 4 %   Basophils Relative 1 %   Neutro Abs  3.3 1.7 - 7.7 K/uL   Lymphs Abs 1.1 0.7 - 4.0 K/uL   Monocytes Absolute 0.6 0.1 - 1.0 K/uL   Eosinophils Absolute 0.2 0.0 - 0.7 K/uL   Basophils Absolute 0.1 0.0 - 0.1 K/uL   Smear Review MORPHOLOGY UNREMARKABLE   Comprehensive metabolic panel     Status: Abnormal   Collection Time: 02/13/16  2:37 PM  Result Value Ref Range   Sodium 137 135 - 145 mmol/L   Potassium 3.7 3.5 - 5.1 mmol/L   Chloride 101 101 - 111 mmol/L   CO2 26 22 - 32 mmol/L   Glucose, Bld 147 (H) 65 - 99 mg/dL   BUN 6 6 - 20 mg/dL   Creatinine, Ser 0.73 0.44 - 1.00 mg/dL   Calcium 8.4 (L) 8.9 - 10.3 mg/dL   Total Protein 7.5 6.5 - 8.1 g/dL   Albumin 3.4 (L) 3.5 - 5.0 g/dL   AST 116 (H) 15 - 41 U/L   ALT 43 14 - 54 U/L   Alkaline Phosphatase 89 38 - 126 U/L   Total Bilirubin 1.0 0.3 - 1.2 mg/dL   GFR calc non Af Amer >60 >60 mL/min   GFR calc Af Amer >60 >60 mL/min    Comment: (NOTE) The eGFR has been calculated using the CKD EPI equation. This calculation has not been validated in all clinical situations. eGFR's persistently <60 mL/min signify possible Chronic Kidney Disease.    Anion gap 10 5 - 15  MRSA PCR Screening     Status: None   Collection Time: 02/13/16  4:23 PM  Result Value Ref Range   MRSA by PCR NEGATIVE NEGATIVE    Comment:        The GeneXpert MRSA Assay (FDA approved for NASAL specimens only), is one component of a comprehensive MRSA colonization surveillance program. It is not intended to diagnose MRSA infection nor to guide or monitor treatment for MRSA infections.   CBC WITH DIFFERENTIAL     Status: Abnormal   Collection Time: 02/14/16  3:24 AM  Result Value Ref Range   WBC 3.6 (L) 4.0 - 10.5 K/uL   RBC 4.20 3.87 - 5.11 MIL/uL   Hemoglobin 14.5 12.0 - 15.0 g/dL   HCT 44.3 36.0 - 46.0 %   MCV 105.5 (H) 78.0 - 100.0 fL   MCH 34.5 (H) 26.0 - 34.0 pg   MCHC 32.7 30.0 - 36.0 g/dL   RDW 12.4 11.5 - 15.5 %   Platelets 80 (L) 150 - 400 K/uL    Comment: CONSISTENT WITH PREVIOUS  RESULT   Neutrophils Relative % 61 %   Neutro Abs 2.2 1.7 - 7.7 K/uL   Lymphocytes Relative 24 %   Lymphs Abs 0.9 0.7 - 4.0 K/uL   Monocytes Relative 8 %   Monocytes Absolute  0.3 0.1 - 1.0 K/uL   Eosinophils Relative 6 %   Eosinophils Absolute 0.2 0.0 - 0.7 K/uL   Basophils Relative 0 %   Basophils Absolute 0.0 0.0 - 0.1 K/uL  Comprehensive metabolic panel     Status: Abnormal   Collection Time: 02/14/16  5:15 AM  Result Value Ref Range   Sodium 135 135 - 145 mmol/L   Potassium 3.3 (L) 3.5 - 5.1 mmol/L   Chloride 100 (L) 101 - 111 mmol/L   CO2 24 22 - 32 mmol/L   Glucose, Bld 109 (H) 65 - 99 mg/dL   BUN 6 6 - 20 mg/dL   Creatinine, Ser 0.50 0.44 - 1.00 mg/dL   Calcium 8.5 (L) 8.9 - 10.3 mg/dL   Total Protein 8.0 6.5 - 8.1 g/dL   Albumin 3.6 3.5 - 5.0 g/dL   AST 100 (H) 15 - 41 U/L   ALT 40 14 - 54 U/L   Alkaline Phosphatase 93 38 - 126 U/L   Total Bilirubin 1.8 (H) 0.3 - 1.2 mg/dL   GFR calc non Af Amer >60 >60 mL/min   GFR calc Af Amer >60 >60 mL/min    Comment: (NOTE) The eGFR has been calculated using the CKD EPI equation. This calculation has not been validated in all clinical situations. eGFR's persistently <60 mL/min signify possible Chronic Kidney Disease.    Anion gap 11 5 - 15    Current Facility-Administered Medications  Medication Dose Route Frequency Provider Last Rate Last Dose  . 0.9 % NaCl with KCl 20 mEq/ L  infusion   Intravenous Continuous Reubin Milan, MD 100 mL/hr at 02/14/16 0556    . acetaminophen (TYLENOL) tablet 650 mg  650 mg Oral Q4H PRN Jeryl Columbia, NP   650 mg at 02/14/16 4970  . LORazepam (ATIVAN) tablet 0-4 mg  0-4 mg Oral 4 times per day Montine Circle, PA-C   1 mg at 02/14/16 0559   Followed by  . [START ON 02/15/2016] LORazepam (ATIVAN) tablet 0-4 mg  0-4 mg Oral Q12H Montine Circle, PA-C      . LORazepam (ATIVAN) tablet 2 mg  2 mg Oral Q4H PRN Ripley Fraise, MD   2 mg at 02/14/16 0808  . metoprolol tartrate (LOPRESSOR)  tablet 25 mg  25 mg Oral BID Reubin Milan, MD   25 mg at 02/14/16 1021  . ondansetron (ZOFRAN) tablet 4 mg  4 mg Oral Q6H PRN Reubin Milan, MD       Or  . ondansetron [facility]) injection 4 mg  4 mg Intravenous Q6H PRN Reubin Milan, MD      . ondansetron [facility]) injection 4 mg  4 mg Intravenous Q6H PRN Reubin Milan, MD      . sodium chloride flush (NS) 0.9 % injection 3 mL  3 mL Intravenous Q12H Reubin Milan, MD   3 mL at 02/13/16 2155  . thiamine (VITAMIN B-1) tablet 100 mg  100 mg Oral Daily Montine Circle, PA-C   100 mg at 02/14/16 1021   Or  . thiamine (B-1) injection 100 mg  100 mg Intravenous Daily Montine Circle, PA-C        Musculoskeletal: Strength & Muscle Tone: decreased Gait & Station: unable to stand Patient leans: N/A  Psychiatric Specialty Exam: Review of Systems  Constitutional: Positive for weight loss.  Neurological: Positive for dizziness and weakness.  Psychiatric/Behavioral: Positive for depression and substance abuse. The patient has insomnia.   All  other systems reviewed and are negative.   Blood pressure 155/88, pulse 89, temperature 97.6 F (36.4 C), temperature source Oral, resp. rate 19, height _0  (1.6 m), weight 104 kg (229 lb 4.5 oz), SpO2 99 %.Body mass index is 40.63 kg/(m^2).  General Appearance: Casual  Eye Contact::  Good  Speech:  Clear and Coherent  Volume:  Decreased  Mood:  Anxious and Depressed  Affect:  Constricted and Depressed  Thought Process:  Coherent and Goal Directed  Orientation:  Full (Time, Place, and Person)  Thought Content:  Rumination  Suicidal Thoughts:  No  Homicidal Thoughts:  No  Memory:  Immediate;   Fair Recent;   Fair  Judgement:  Impaired  Insight:  Good  Psychomotor Activity:  Decreased  Concentration:  Fair  Recall:  [employer] of Knowledge:Good  Language: Good  Akathisia:  Negative  Handed:  Right  AIMS (if indicated):     Assets:  Communication Skills Desire for  Improvement Financial Resources/Insurance Housing Leisure Time Resilience Social Support Transportation  ADL's:  Intact  Cognition: WNL  Sleep:      Treatment Plan Summary: Patient has no safety concerns as she denies active suicidal/homicidal ideation, intention or plans.  Continue Ativan detox protocol and CIWA monitoring  Patient has been recovering from alcoholic withdrawal symptoms Encouraged to participate in substance abuse treatment program and counseling Will be referred to the outpatient psychiatric services for substance induced mood disorder   Disposition: Patient does not meet criteria for psychiatric inpatient admission. Supportive therapy provided about ongoing stressors.  Durward Parcel., MD 02/14/2016 10:34 AM

## 2016-02-14 NOTE — Progress Notes (Addendum)
Transfer note: Was asked to evaluate pt for possible transfer to telemetry. Pt has remained calm and cooperative this evening. CIWA score currently 6. Previous score was 8. HR and BP have improved. At bedside pt noted resting quietly in NAD. Safety sitter at bedside. BP currently 149/98, HR-80-90's. 02 sats are 94-98% on r/a w/ RR of 22-26. Record reviewed. K+ 3.7, Mag 1.4 and Phos 1.9. Serum alcohol < 5 as of 1430 today.  Assessment/Plan: 1.  Alcohol withdrawal syndrome: Pt appears quite stable. Has required minimal Ativan w/ CIWA scores of 8 or less x 8 hours. HR and BP have improved. Will optimize Mag and Phos and follow up AM labs. Continue Recruitment consultant and suicide precautions. Will transfer to telemetry and continue to monitor closely.   Leanne Chang, NP-C [facility] Pager [phone]

## 2016-02-14 NOTE — Progress Notes (Signed)
Initial Nutrition Assessment  DOCUMENTATION CODES:   Morbid obesity  INTERVENTION:  - Continue Heart Healthy diet - Monitor magnesium, potassium, and phosphorus daily for at least 3 days, MD to replete as needed, as pt is at risk for refeeding syndrome given poor PO intakes associated with alcohol consumption. - RD will continue to monitor for needs  NUTRITION DIAGNOSIS:   Inadequate oral intake related to social / environmental circumstances as evidenced by per patient/family report.  GOAL:   Patient will meet greater than or equal to 90% of their needs  MONITOR:   PO intake, Weight trends, Labs, I & O's  REASON FOR ASSESSMENT:   Malnutrition Screening Tool  ASSESSMENT:   45 y.o. female with a past medical history of hypertension, hyperlipidemia, anxiety, depression who came in last night at around 2000 due to having suicidal ideations and was also found to be intoxicated with alcohol.  Pt seen for MST. BMI indicates morbid obesity. Pt ate 50% of dinner last night per chart review. Pt reports that this AM she had a few bites of eggs and was unable to consume oatmeal. She states that she is feeling some abdominal discomfort related to PO intakes. Per chart, pt had reported that she does not eat when she is drinking. Pt confirms this and states that she will go without eating all day and late at night may eat something such as a can of ravioli. She states that she has nausea/abdominal discomfort with all PO intakes as well as experiencing loose, yellow-colored stools when she is drinking.   Pt states that she started a new job in December and has lost 40 lbs since that time due to high physical activity level of this job; she also states that she is continuously trying to lose weight. Limited weight hx available in the chart. This indicates that pt has gained 39 lbs since 07/30/15; will continue to monitor weight trends.   Not meeting needs currently due to alcohol abuse. Medications  reviewed; KPhos and KCl x1 today. Labs reviewed; K: 3.3 mmol/L, Cl: 100 mmol/L, Ca: 8.5 mg/dL. IVF: NS-20 mEq KCl @ 100 mL/hr.    Diet Order:  Diet Heart Room service appropriate?: Yes; Fluid consistency:: Thin  Skin:  Reviewed, no issues  Last BM:  2/19  Height:   Ht Readings from Last 1 Encounters:  02/13/16  (1.6 m)    Weight:   Wt Readings from Last 1 Encounters:  02/13/16 229 lb 4.5 oz (104 kg)    Ideal Body Weight:  52.27 kg (kg)  BMI:  Body mass index is 40.63 kg/(m^2).  Estimated Nutritional Needs:   Kcal:  1600-1875 (15-18 kcal/kg)  Protein:  105-115 grams  Fluid:  2-2.3 L/day  EDUCATION NEEDS:   No education needs identified at this time     Trenton Gammon, RD, LDN Inpatient Clinical Dietitian Pager # [phone] After hours/weekend pager # [phone]

## 2016-02-15 LAB — MAGNESIUM: Magnesium: 1.7 mg/dL (ref 1.7–2.4)

## 2016-02-15 LAB — CBC
HCT: 45.5 % (ref 36.0–46.0)
HEMOGLOBIN: 15.5 g/dL — AB (ref 12.0–15.0)
MCH: 35.1 pg — AB (ref 26.0–34.0)
MCHC: 34.1 g/dL (ref 30.0–36.0)
MCV: 103.2 fL — ABNORMAL HIGH (ref 78.0–100.0)
PLATELETS: 105 10*3/uL — AB (ref 150–400)
RBC: 4.41 MIL/uL (ref 3.87–5.11)
RDW: 12.2 % (ref 11.5–15.5)
WBC: 8.6 10*3/uL (ref 4.0–10.5)

## 2016-02-15 LAB — BASIC METABOLIC PANEL
ANION GAP: 10 (ref 5–15)
BUN: 7 mg/dL (ref 6–20)
CALCIUM: 8.7 mg/dL — AB (ref 8.9–10.3)
CO2: 22 mmol/L (ref 22–32)
Chloride: 104 mmol/L (ref 101–111)
Creatinine, Ser: 0.55 mg/dL (ref 0.44–1.00)
GFR calc Af Amer: >60 mL/min (ref >60)
GLUCOSE: 93 mg/dL (ref 65–99)
POTASSIUM: 3.9 mmol/L (ref 3.5–5.1)
Sodium: 136 mmol/L (ref 135–145)

## 2016-02-15 LAB — PHOSPHORUS: Phosphorus: 2.5 mg/dL (ref 2.5–4.6)

## 2016-02-15 MED ORDER — MAGNESIUM SULFATE IN D5W 10-5 MG/ML-% IV SOLN
1.0000 g | Freq: Once | INTRAVENOUS | Status: DC
  Filled 2016-02-15: qty 100

## 2016-02-15 NOTE — Progress Notes (Signed)
Patient expressed desire wanting to go home and is very insistent,teary eyed, verbalized "I am not a prisoner here why do you have to hold me here"?. This Rn explained that she is not medically stable to leave the hospital, pt stated "then I will go to another hospital if needed". Pt was also asked by this Rn if she still has the thought of killing herself,  pts.reponse was "No the only time was when I was in emergency room".  MD paged came to see and talk to the patient, explained further the need of her to stay in the hospital, but patient still very insistent to leave  Against medical advise, necessary paper work signed by patient. Patient alert and oriented, no distress  upon leaving hospital premises.  bus pass provided.

## 2016-02-15 NOTE — Discharge Summary (Signed)
Patient left AGAINST MEDICAL ADVICE, tried  to convince her to stay until a.m. but she was adamant about leaving, she is awake alert oriented 3, appropriate, ,  cleared earlier by psychiatry,  was asked to stay until she received Librium, thiamine and folic acid prescription, but she declined to do so and left AGAINST MEDICAL ADVICE . - Please see progress note dictated earlier during the day by me. Huey Bienenstock MD

## 2016-02-15 NOTE — Progress Notes (Signed)
Patient Demographics  Tiffany Howell, is a 45 y.o. female, DOB - 1971/12/14, WUJ:[id number]  Admit date - 02/12/2016   Admitting Physician Bobette Mo, MD  Outpatient Primary MD for the patient is [name], [name], MD  LOS - 2   Chief Complaint  Patient presents with  . Suicidal  . Alcohol Intoxication       Admission HPI/Brief narrative: 45 y.o. female with a past medical history of hypertension, hyperlipidemia, anxiety, depression presents with suicidal ideations and was also found to be intoxicated with alcohol. Subjective:   Tiffany Howell today has,  No chest pain, No abdominal pain - No Nausea, no headache Assessment & Plan    Principal Problem:   Alcohol withdrawal syndrome (HCC) Active Problems:   Hypertension   Elevated LFTs   Hyperlipidemia   Elevated MCV   Suicidal ideations   Sinus tachycardia (HCC)  Alcohol abuse /alcohol withdrawal syndrome - Patient was counseled, started on CIWA protocol , required 12 mg of Ativan over last 24 hours. - Continue with thiamine, folic acid and multivitamin  Hypertension - Acceptable on metoprolol  Elevated LFTs - Secondary to alcohol abuse  Thrombocytopenia - Secondary to alcohol abuse, SCDs for DVT prophylaxis  Suicidal ideation - Psychiatric consult appreciated, not safety or suicidal risk anymore  Hypokalemia - Repleted, recheck in a.m.  Code Status: Full  Family Communication: None at bedside  Disposition Plan: Home when stable   Procedures  None   Consults   Psychiatry   Medications  Scheduled Meds: . [START ON 02/16/2016] LORazepam  0-4 mg Oral Q8H   Followed by  . LORazepam  0-4 mg Oral Q4H  . magnesium sulfate 1 - 4 g bolus IVPB  1 g Intravenous Once  . metoprolol tartrate  25 mg Oral BID  . sodium chloride flush  3 mL Intravenous Q12H  . thiamine  100 mg Oral Daily   Or  . thiamine  100 mg  Intravenous Daily   Continuous Infusions: . 0.9 % NaCl with KCl 20 mEq / L 75 mL/hr at 02/15/16 0508   PRN Meds:.acetaminophen, LORazepam **OR** LORazepam, ondansetron **OR** ondansetron (ZOFRAN) IV, ondansetron (ZOFRAN) IV  DVT Prophylaxis SCDs  Lab Results  Component Value Date   PLT 105* 02/15/2016    Antibiotics   Anti-infectives    None          Objective:   Filed Vitals:   02/14/16 1619 02/14/16 2347 02/15/16 0437 02/15/16 1321  BP: 158/106 141/89 140/75 147/86  Pulse: 100 98 92 98  Temp: 99.1 F (37.3 C) 98.9 F (37.2 C) 98.4 F (36.9 C) 98.5 F (36.9 C)  TempSrc: Oral Oral Oral Oral  Resp: Height:      Weight:      SpO2: 99% 98% 98% 98%    Wt Readings from Last 3 Encounters:  02/13/16 104 kg (229 lb 4.5 oz)  07/30/15 86.183 kg (190 lb)  11/15/12 90.719 kg (200 lb)     Intake/Output Summary (Last 24 hours) at 02/15/16 1637 Last data filed at 02/15/16 1322  Gross per 24 hour  Intake   2675 ml  Output      0 ml  Net   2675 ml  Physical Exam  Awake Alert,   NCT,PERRAL Supple Neck,No JVD,  Symmetrical Chest wall movement, Good air movement bilaterally, CTAB RRR,No Gallops,Rubs or new Murmurs, No Parasternal Heave +ve B.Sounds, Abd Soft, No tenderness, No organomegaly appriciated, No rebound - guarding or rigidity. No Cyanosis, Clubbing or edema, No new Rash or bruise  , hand  tremors +  Data Review   Micro Results Recent Results (from the past 240 hour(s))  MRSA PCR Screening     Status: None   Collection Time: 02/13/16  4:23 PM  Result Value Ref Range Status   MRSA by PCR NEGATIVE NEGATIVE Final    Comment:        The GeneXpert MRSA Assay (FDA approved for NASAL specimens only), is one component of a comprehensive MRSA colonization surveillance program. It is not intended to diagnose MRSA infection nor to guide or monitor treatment for MRSA infections.     Radiology Reports No results found.    CBC  Recent Labs Lab 02/12/16 2049 02/13/16 1437 02/14/16 0324 02/15/16 0517  WBC 9.2 5.3 3.6* 8.6  HGB 14.5 14.7 14.5 15.5*  HCT 45.1 43.0 44.3 45.5  PLT 149* 98* 80* 105*  MCV 106.4* 103.6* 105.5* 103.2*  MCH 34.2* 35.4* 34.5* 35.1*  MCHC 32.2 34.2 32.7 34.1  RDW 12.8 12.2 12.4 12.2  LYMPHSABS  --  1.1 0.9  --   MONOABS  --  0.6 0.3  --   EOSABS  --  0.2 0.2  --   BASOSABS  --  0.1 0.0  --     Chemistries   Recent Labs Lab 02/12/16 2049 02/13/16 1437 02/14/16 0515 02/15/16 0517  NA 142 137 135 136  K 3.8 3.7 3.3* 3.9  CL 102 101 100* 104  CO2 GLUCOSE 94 147* 109* 93  BUN <5* CREATININE 0.59 0.73 0.50 0.55  CALCIUM 8.7* 8.4* 8.5* 8.7*  MG  --  1.4*  --  1.7  AST 143* 116* 100*  --   ALT 50 43 40  --   ALKPHOS 94 89 93  --   BILITOT 1.1 1.0 1.8*  --    ------------------------------------------------------------------------------------------------------------------ estimated creatinine clearance is 101.3 mL/min (by C-G formula based on Cr of 0.55). ------------------------------------------------------------------------------------------------------------------ No results for input(s): HGBA1C in the last 72 hours. ------------------------------------------------------------------------------------------------------------------ No results for input(s): CHOL, HDL, LDLCALC, TRIG, CHOLHDL, LDLDIRECT in the last 72 hours. ------------------------------------------------------------------------------------------------------------------ No results for input(s): TSH, T4TOTAL, T3FREE, THYROIDAB in the last 72 hours.  Invalid input(s): FREET3 ------------------------------------------------------------------------------------------------------------------ No results for input(s): VITAMINB12, FOLATE, FERRITIN, TIBC, IRON, RETICCTPCT in the last 72 hours.  Coagulation profile No results for input(s): INR, PROTIME in the last 168 hours.  No results  for input(s): DDIMER in the last 72 hours.  Cardiac Enzymes No results for input(s): CKMB, TROPONINI, MYOGLOBIN in the last 168 hours.  Invalid input(s): CK ------------------------------------------------------------------------------------------------------------------ Invalid input(s): POCBNP     Time Spent in minutes   25 minutes   ELGERGAWY, DAWOOD M.D on 02/15/2016 at 4:37 PM  Between 7am to 7pm - Pager - [phone]  After 7pm go to [email or web address] - password [facility]  [facility]   Office  [phone]

## 2016-02-17 ENCOUNTER — Emergency Department (HOSPITAL_COMMUNITY)
Admission: EM | Admit: 2016-02-17 | Discharge: 2016-02-17 | Disposition: A | Discharge status: Home / Self Care | Payer: Medicaid Other | Attending: Emergency Medicine | Admitting: Emergency Medicine

## 2016-02-17 ENCOUNTER — Encounter (HOSPITAL_COMMUNITY): Payer: Self-pay

## 2016-02-17 DIAGNOSIS — Z79899 Other long term (current) drug therapy: Secondary | ICD-10-CM | POA: Diagnosis not present

## 2016-02-17 DIAGNOSIS — F329 Major depressive disorder, single episode, unspecified: Secondary | ICD-10-CM | POA: Insufficient documentation

## 2016-02-17 DIAGNOSIS — R000 Tachycardia, unspecified: Secondary | ICD-10-CM | POA: Insufficient documentation

## 2016-02-17 DIAGNOSIS — F411 Generalized anxiety disorder: Secondary | ICD-10-CM

## 2016-02-17 DIAGNOSIS — M549 Dorsalgia, unspecified: Secondary | ICD-10-CM | POA: Diagnosis not present

## 2016-02-17 DIAGNOSIS — F419 Anxiety disorder, unspecified: Secondary | ICD-10-CM | POA: Diagnosis not present

## 2016-02-17 DIAGNOSIS — Z8719 Personal history of other diseases of the digestive system: Secondary | ICD-10-CM | POA: Insufficient documentation

## 2016-02-17 DIAGNOSIS — I10 Essential (primary) hypertension: Secondary | ICD-10-CM | POA: Insufficient documentation

## 2016-02-17 DIAGNOSIS — E7800 Pure hypercholesterolemia, unspecified: Secondary | ICD-10-CM | POA: Insufficient documentation

## 2016-02-17 DIAGNOSIS — R002 Palpitations: Secondary | ICD-10-CM | POA: Diagnosis present

## 2016-02-17 MED ORDER — DIAZEPAM 5 MG PO TABS
10.0000 mg | ORAL_TABLET | Freq: Once | ORAL | Status: AC
  Administered 2016-02-17: 10 mg via ORAL
  Filled 2016-02-17: qty 2

## 2016-02-17 MED ORDER — IBUPROFEN 200 MG PO TABS
600.0000 mg | ORAL_TABLET | Freq: Once | ORAL | Status: AC
  Administered 2016-02-17: 600 mg via ORAL
  Filled 2016-02-17: qty 3

## 2016-02-17 MED ORDER — HYDROCODONE-ACETAMINOPHEN 5-325 MG PO TABS
1.0000 | ORAL_TABLET | Freq: Once | ORAL | Status: AC
  Administered 2016-02-17: 1 via ORAL
  Filled 2016-02-17: qty 1

## 2016-02-17 NOTE — ED Notes (Signed)
Patient states having anxiety about the sell of her house.  Said her heart "felt like it was beating really fast"

## 2016-02-17 NOTE — Discharge Instructions (Signed)

## 2016-02-17 NOTE — ED Notes (Signed)
Patient arrives by [facility] with complaints of anxiety and hypertension.  BP 120/68 per EMS

## 2016-02-17 NOTE — ED Notes (Signed)
EKG given to EDP,Kohut,MD., for review. 

## 2016-02-17 NOTE — ED Notes (Signed)
Bed: ZO10 Expected date:  Expected time:  Means of arrival:  Comments: Pt in room getting dressed

## 2016-02-17 NOTE — ED Provider Notes (Signed)
CSN: [id number]     Arrival date & time 02/17/16  0147 History  By signing my name below, I, Bethel Born, attest that this documentation has been prepared under the direction and in the presence of Raeford Razor, MD. Electronically Signed: Bethel Born, ED Scribe. 02/17/2016. 2:48 AM     Chief Complaint  Patient presents with  . Anxiety   The history is provided by the patient. No language interpreter was used.   Brought in by EMS, Tiffany Howell is a 45 y.o. female with history of anxiety, depression, HTN, and hypercholesteremia who presents to the Emergency Department complaining of anxiety with onset just PTA. Pt states that she just left the hospital AMA because of competing priorities related to selling her home after the estrangement of her spouse. Tonight she received a letter related to that endeavor and believes that she had a panic attack. Pt states "I got a lot on me and I don't know what to do".  Associated symptoms include chest tightness, palpitations(fluttering and racing), and dizziness. Performing breathing exercises did not alleviate her symptoms at home but she does feel better at present. Pt also complains of lower back pain stating that someone stole her hydrocodone.  NKDA.  Past Medical History  Diagnosis Date  . Hypertension   . Anxiety   . Hypercholesteremia   . Depression   . Fatty liver    History reviewed. No pertinent past surgical history. Family History  Problem Relation Age of Onset  . Diabetes Mellitus II Mother   . Hypertension Mother   . Heart failure Mother    Social History  Substance Use Topics  . Smoking status: Never Smoker   . Smokeless tobacco: None  . Alcohol Use: Yes     Comment: 12 pack today   OB History    No data available     Review of Systems  Respiratory: Positive for chest tightness and shortness of breath.   Cardiovascular: Positive for palpitations.  Musculoskeletal: Positive for back pain.  Neurological:  Positive for dizziness.  Psychiatric/Behavioral: The patient is nervous/anxious.   All other systems reviewed and are negative.  Allergies  Review of patient's allergies indicates no known allergies.  Home Medications   Prior to Admission medications   Medication Sig Start Date End Date Taking? Authorizing Provider  ALPRAZolam Prudy Feeler) 1 MG tablet Take 1 mg by mouth 3 (three) times daily as needed for anxiety.    Historical Provider, MD  atenolol (TENORMIN) 25 MG tablet Take 1 tablet (25 mg total) by mouth daily. Patient not taking: Reported on 07/10/2015 02/07/15   Maretta Bees, MD  citalopram (CELEXA) 40 MG tablet Take 40 mg by mouth daily. 06/10/15   Historical Provider, MD  cyclobenzaprine (FLEXERIL) 5 MG tablet Take 1 tablet (5 mg total) by mouth 3 (three) times daily as needed for muscle spasms. Patient not taking: Reported on 02/12/2016 07/30/15   Teressa Lower, NP  folic acid (FOLVITE) 1 MG tablet Take 1 tablet (1 mg total) by mouth daily. 02/07/15   Shanker Levora Dredge, MD  HYDROcodone-acetaminophen (NORCO) 7.5-325 MG per tablet Take 1 tablet by mouth every 12 (twelve) hours as needed for moderate pain.    Historical Provider, MD  HYDROcodone-acetaminophen (NORCO/VICODIN) 5-325 MG per tablet Take 1 tablet by mouth every 4 (four) hours as needed. Patient not taking: Reported on 02/12/2016 07/30/15   Teressa Lower, NP  ibuprofen (ADVIL,MOTRIN) 200 MG tablet Take 600 mg by mouth every 6 (six) hours as  needed for moderate pain.    Historical Provider, MD  lisinopril (PRINIVIL,ZESTRIL) 10 MG tablet TAKE 1 TABLET EVERY DAY AND STOP THE LISINOPRIL/HCTZ 06/24/15   Historical Provider, MD  LORazepam (ATIVAN) 1 MG tablet Take 1 mg po twice daily for 2 day, then, Take 1 mg po daily for 1 day, then, Take 1 mg po daily as needed Patient not taking: Reported on 07/10/2015 02/07/15   Maretta Bees, MD  Multiple Vitamin (MULTIVITAMIN WITH MINERALS) TABS tablet Take 1 tablet by mouth daily. 02/07/15    Shanker Levora Dredge, MD  naphazoline (NAPHCON) 0.1 % ophthalmic solution Place 2 drops into both eyes 4 (four) times daily as needed for irritation or allergies. Patient not taking: Reported on 07/10/2015 02/07/15   Maretta Bees, MD  rosuvastatin (CRESTOR) 5 MG tablet Take 5 mg by mouth daily.    Historical Provider, MD  thiamine 100 MG tablet Take 1 tablet (100 mg total) by mouth daily. 02/07/15   Shanker Levora Dredge, MD   BP 129/75 mmHg  Pulse 102  Temp(Src) 98.7 F (37.1 C) (Oral)  Resp 18  SpO2 95% Physical Exam  Constitutional: She is oriented to person, place, and time. She appears well-developed and well-nourished. No distress.  Crying  HENT:  Head: Normocephalic and atraumatic.  Eyes: EOM are normal.  Neck: Normal range of motion.  Cardiovascular: Regular rhythm and normal heart sounds.  Tachycardia present.   Pulmonary/Chest: Effort normal and breath sounds normal.  Abdominal: Soft. She exhibits no distension. There is no tenderness.  Musculoskeletal: Normal range of motion.  Neurological: She is alert and oriented to person, place, and time.  Skin: Skin is warm and dry.  Psychiatric: Judgment normal. Her mood appears anxious.  Nursing note and vitals reviewed.   ED Course  Procedures (including critical care time) \DIAGNOSTIC STUDIES: Oxygen Saturation is 95% on RA,  normal by my interpretation.    COORDINATION OF CARE: 2:34 AM Discussed treatment plan which includes Valium and an EKG with pt at bedside and pt agreed to plan.  Labs Review Labs Reviewed - No data to display  Imaging Review No results found.    EKG Interpretation None      MDM   Final diagnoses:  Anxiety reaction  34y  female with symptoms very consistent with anxiety reaction. Multiple stressors. She is not suicidal or psychotic. Symptoms improved with Valium. Low suspicion for emergent process. It has been determined that no acute conditions requiring further emergency intervention are  present at this time. The patient has been advised of the diagnosis and plan. I reviewed any labs and imaging including any potential incidental findings. We have discussed signs and symptoms that warrant return to the ED and they are listed in the discharge instructions.   I personally preformed the services scribed in my presence. The recorded information has been reviewed is accurate. Raeford Razor, MD.    Raeford Razor, MD 02/27/16 [phone]

## 2016-02-17 NOTE — ED Notes (Signed)
Bed: WA18 Expected date:  Expected time:  Means of arrival:  Comments: EMS 

## 2016-02-18 NOTE — ED Notes (Signed)
Pt's chart accessed by this Charge RN d/t her calling about missing home medications.

## 2016-02-21 ENCOUNTER — Other Ambulatory Visit: Payer: Self-pay

## 2016-02-21 DIAGNOSIS — Z1231 Encounter for screening mammogram for malignant neoplasm of breast: Secondary | ICD-10-CM

## 2016-02-23 ENCOUNTER — Ambulatory Visit: Payer: Self-pay

## 2016-02-24 ENCOUNTER — Ambulatory Visit: Payer: Self-pay

## 2016-03-17 ENCOUNTER — Ambulatory Visit
Admission: RE | Admit: 2016-03-17 | Discharge: 2016-03-17 | Disposition: A | Discharge status: Home / Self Care | Payer: Medicaid Other | Source: Ambulatory Visit

## 2016-03-17 DIAGNOSIS — Z1231 Encounter for screening mammogram for malignant neoplasm of breast: Secondary | ICD-10-CM

## 2016-04-06 ENCOUNTER — Emergency Department (HOSPITAL_COMMUNITY)
Admission: EM | Admit: 2016-04-06 | Discharge: 2016-04-08 | Disposition: A | Discharge status: Home / Self Care | Payer: Medicaid Other | Attending: Emergency Medicine | Admitting: Emergency Medicine

## 2016-04-06 ENCOUNTER — Encounter (HOSPITAL_COMMUNITY): Payer: Self-pay | Admitting: Emergency Medicine

## 2016-04-06 DIAGNOSIS — I10 Essential (primary) hypertension: Secondary | ICD-10-CM | POA: Diagnosis not present

## 2016-04-06 DIAGNOSIS — F419 Anxiety disorder, unspecified: Secondary | ICD-10-CM | POA: Diagnosis not present

## 2016-04-06 DIAGNOSIS — Z79899 Other long term (current) drug therapy: Secondary | ICD-10-CM | POA: Diagnosis not present

## 2016-04-06 DIAGNOSIS — Z3202 Encounter for pregnancy test, result negative: Secondary | ICD-10-CM | POA: Diagnosis not present

## 2016-04-06 DIAGNOSIS — F10220 Alcohol dependence with intoxication, uncomplicated: Secondary | ICD-10-CM

## 2016-04-06 DIAGNOSIS — F329 Major depressive disorder, single episode, unspecified: Secondary | ICD-10-CM | POA: Diagnosis not present

## 2016-04-06 DIAGNOSIS — E7800 Pure hypercholesterolemia, unspecified: Secondary | ICD-10-CM | POA: Diagnosis not present

## 2016-04-06 DIAGNOSIS — Z8719 Personal history of other diseases of the digestive system: Secondary | ICD-10-CM | POA: Insufficient documentation

## 2016-04-06 DIAGNOSIS — F32A Depression, unspecified: Secondary | ICD-10-CM

## 2016-04-06 DIAGNOSIS — F1010 Alcohol abuse, uncomplicated: Secondary | ICD-10-CM | POA: Insufficient documentation

## 2016-04-06 HISTORY — DX: Alcohol dependence, uncomplicated: F10.20

## 2016-04-06 LAB — RAPID URINE DRUG SCREEN, HOSP PERFORMED
Amphetamines: NOT DETECTED
Barbiturates: NOT DETECTED
Benzodiazepines: NOT DETECTED
Cocaine: NOT DETECTED
OPIATES: NOT DETECTED
Tetrahydrocannabinol: NOT DETECTED

## 2016-04-06 LAB — COMPREHENSIVE METABOLIC PANEL
ALBUMIN: 3.7 g/dL (ref 3.5–5.0)
ALK PHOS: 67 U/L (ref 38–126)
ALT: 48 U/L (ref 14–54)
ANION GAP: 13 (ref 5–15)
AST: 103 U/L — ABNORMAL HIGH (ref 15–41)
BILIRUBIN TOTAL: 0.9 mg/dL (ref 0.3–1.2)
BUN: <5 mg/dL — ABNORMAL LOW (ref 6–20)
CALCIUM: 9.1 mg/dL (ref 8.9–10.3)
CO2: 26 mmol/L (ref 22–32)
CREATININE: 0.59 mg/dL (ref 0.44–1.00)
Chloride: 93 mmol/L — ABNORMAL LOW (ref 101–111)
GFR calc Af Amer: >60 mL/min (ref >60)
GFR calc non Af Amer: >60 mL/min (ref >60)
GLUCOSE: 95 mg/dL (ref 65–99)
Potassium: 3.6 mmol/L (ref 3.5–5.1)
SODIUM: 132 mmol/L — AB (ref 135–145)
TOTAL PROTEIN: 8.5 g/dL — AB (ref 6.5–8.1)

## 2016-04-06 LAB — CBC
HCT: 42.2 % (ref 36.0–46.0)
Hemoglobin: 14.6 g/dL (ref 12.0–15.0)
MCH: 35 pg — AB (ref 26.0–34.0)
MCHC: 34.6 g/dL (ref 30.0–36.0)
MCV: 101.2 fL — AB (ref 78.0–100.0)
PLATELETS: 152 10*3/uL (ref 150–400)
RBC: 4.17 MIL/uL (ref 3.87–5.11)
RDW: 12.2 % (ref 11.5–15.5)
WBC: 8.5 10*3/uL (ref 4.0–10.5)

## 2016-04-06 LAB — ETHANOL: Alcohol, Ethyl (B): 390 mg/dL (ref <5)

## 2016-04-06 MED ORDER — LORAZEPAM 1 MG PO TABS
0.0000 mg | ORAL_TABLET | Freq: Four times a day (QID) | ORAL | Status: DC
  Administered 2016-04-07: 1 mg via ORAL
  Administered 2016-04-07 – 2016-04-08 (×5): 2 mg via ORAL
  Filled 2016-04-06: qty 1
  Filled 2016-04-06 (×5): qty 2

## 2016-04-06 MED ORDER — LORAZEPAM 1 MG PO TABS: 0.0000 mg | ORAL_TABLET | Freq: Two times a day (BID) | ORAL | Status: DC

## 2016-04-06 NOTE — ED Notes (Signed)
TTS in progress 

## 2016-04-06 NOTE — ED Notes (Signed)
Pt. requesting detox for her alcoholism , last drink today , denies suicidal ideations / no hallucinations .

## 2016-04-06 NOTE — ED Notes (Signed)
Dr. Clarice PolePfeifer ( EDP ) notified on elevated blood ETOH result .

## 2016-04-06 NOTE — ED Provider Notes (Signed)
CSN: [id number]     Arrival date & time 04/06/16  1944 History   First MD Initiated Contact with Patient 04/06/16 2300     Chief Complaint  Patient presents with  . Alcohol Problem     (Consider location/radiation/quality/duration/timing/severity/associated sxs/prior Treatment) Patient is a 45 y.o. female presenting with alcohol problem. The history is provided by the patient and a friend. No language interpreter was used.  Alcohol Problem This is a recurrent problem. The current episode started 1 to 4 weeks ago. The problem occurs daily. The problem has been gradually worsening. Pertinent negatives include no chills or fever. Associated symptoms comments: The patient presents with complaint of alcohol dependence and depression. She states she wants to die but does not feel she would take her own life. She reports multiple attempts at stopping drinking including multiple rehab centers but that she stays sober about 4 months before resuming use. Currently, she has been drinking daily for 2 weeks. She is tearful, depressed over loss of her children, unable to find work and unable to stop drinking. She has a history of severe withdrawal syndromes that have required medical admission in the past. .    Past Medical History  Diagnosis Date  . Hypertension   . Anxiety   . Hypercholesteremia   . Depression   . Fatty liver   . Alcoholism (HCC)    History reviewed. No pertinent past surgical history. Family History  Problem Relation Age of Onset  . Diabetes Mellitus II Mother   . Hypertension Mother   . Heart failure Mother    Social History  Substance Use Topics  . Smoking status: Never Smoker   . Smokeless tobacco: None  . Alcohol Use: Yes   OB History    No data available     Review of Systems  Constitutional: Negative for fever and chills.  HENT: Negative.   Respiratory: Negative.   Cardiovascular: Negative.   Gastrointestinal: Negative.   Musculoskeletal: Negative.   Skin:  Negative.   Neurological: Negative.   Psychiatric/Behavioral: Positive for dysphoric mood.       See HPI.      Allergies  Review of patient's allergies indicates no known allergies.  Home Medications   Prior to Admission medications   Medication Sig Start Date End Date Taking? Authorizing Provider  ALPRAZolam Prudy Feeler) 1 MG tablet Take 1 mg by mouth 3 (three) times daily as needed for anxiety.    Historical Provider, MD  atenolol (TENORMIN) 25 MG tablet Take 1 tablet (25 mg total) by mouth daily. Patient not taking: Reported on 07/10/2015 02/07/15   Maretta Bees, MD  citalopram (CELEXA) 40 MG tablet Take 40 mg by mouth daily. 06/10/15   Historical Provider, MD  cyclobenzaprine (FLEXERIL) 5 MG tablet Take 1 tablet (5 mg total) by mouth 3 (three) times daily as needed for muscle spasms. Patient not taking: Reported on 02/12/2016 07/30/15   Teressa Lower, NP  folic acid (FOLVITE) 1 MG tablet Take 1 tablet (1 mg total) by mouth daily. 02/07/15   Shanker Levora Dredge, MD  HYDROcodone-acetaminophen (NORCO) 7.5-325 MG per tablet Take 1 tablet by mouth every 12 (twelve) hours as needed for moderate pain.    Historical Provider, MD  HYDROcodone-acetaminophen (NORCO/VICODIN) 5-325 MG per tablet Take 1 tablet by mouth every 4 (four) hours as needed. Patient not taking: Reported on 02/12/2016 07/30/15   Teressa Lower, NP  ibuprofen (ADVIL,MOTRIN) 200 MG tablet Take 600 mg by mouth every 6 (six) hours as needed  for moderate pain.    Historical Provider, MD  lisinopril (PRINIVIL,ZESTRIL) 10 MG tablet TAKE 1 TABLET EVERY DAY AND STOP THE LISINOPRIL/HCTZ 06/24/15   Historical Provider, MD  LORazepam (ATIVAN) 1 MG tablet Take 1 mg po twice daily for 2 day, then, Take 1 mg po daily for 1 day, then, Take 1 mg po daily as needed Patient not taking: Reported on 07/10/2015 02/07/15   Maretta BeesShanker M Ghimire, MD  Multiple Vitamin (MULTIVITAMIN WITH MINERALS) TABS tablet Take 1 tablet by mouth daily. 02/07/15   Shanker Levora DredgeM  Ghimire, MD  naphazoline (NAPHCON) 0.1 % ophthalmic solution Place 2 drops into both eyes 4 (four) times daily as needed for irritation or allergies. Patient not taking: Reported on 07/10/2015 02/07/15   Maretta BeesShanker M Ghimire, MD  rosuvastatin (CRESTOR) 5 MG tablet Take 5 mg by mouth daily.    Historical Provider, MD  thiamine 100 MG tablet Take 1 tablet (100 mg total) by mouth daily. 02/07/15   Shanker Levora DredgeM Ghimire, MD   BP 123/65 mmHg  Pulse 107  Temp(Src) 98.7 F (37.1 C) (Oral)  Resp 18  SpO2 97% Physical Exam  Constitutional: She is oriented to person, place, and time. She appears well-developed and well-nourished.  Acutely intoxicated but coherent.  HENT:  Head: Normocephalic.  Neck: Normal range of motion. Neck supple.  Cardiovascular: Normal rate and regular rhythm.   Pulmonary/Chest: Effort normal and breath sounds normal.  Abdominal: Soft. Bowel sounds are normal. There is no tenderness. There is no rebound and no guarding.  Musculoskeletal: Normal range of motion.  Neurological: She is alert and oriented to person, place, and time.  Skin: Skin is warm and dry. No rash noted.  Psychiatric: Her speech is normal. She is not agitated and not actively hallucinating. She exhibits a depressed mood.  Patient is tearful, anxious.    ED Course  Procedures (including critical care time) Labs Review Labs Reviewed  COMPREHENSIVE METABOLIC PANEL - Abnormal; Notable for the following:    Sodium 132 (*)    Chloride 93 (*)    BUN <5 (*)    Total Protein 8.5 (*)    AST 103 (*)    All other components within normal limits  ETHANOL - Abnormal; Notable for the following:    Alcohol, Ethyl (B) 390 (*)    All other components within normal limits  CBC - Abnormal; Notable for the following:    MCV 101.2 (*)    MCH 35.0 (*)    All other components within normal limits  URINE RAPID DRUG SCREEN, HOSP PERFORMED    Imaging Review No results found. I have personally reviewed and evaluated these  images and lab results as part of my medical decision-making.   EKG Interpretation None      MDM   Final diagnoses:  None    1. Alcohol dependence 2. Depression  The patient has a significant history of severe withdrawal requiring inpatient treatment. She does not show signs of withdrawal currently, with ETOH of 390, but has been placed on CIWA proactively. She is significantly depressed, has passive suicidal thoughts without active plan. Will have TTS consultation to determine disposition.    Elpidio AnisShari Ermine Spofford, PA-C 04/06/16 2356  Leta BaptistEmily Roe Nguyen, MD 04/07/16 [phone]

## 2016-04-07 MED ORDER — ADULT MULTIVITAMIN W/MINERALS CH
1.0000 | ORAL_TABLET | Freq: Every day | ORAL | Status: DC
  Administered 2016-04-07 – 2016-04-08 (×2): 1 via ORAL
  Filled 2016-04-07 (×2): qty 1

## 2016-04-07 MED ORDER — VITAMIN B-1 100 MG PO TABS
100.0000 mg | ORAL_TABLET | Freq: Every day | ORAL | Status: DC
  Administered 2016-04-07 – 2016-04-08 (×2): 100 mg via ORAL
  Filled 2016-04-07 (×2): qty 1

## 2016-04-07 MED ORDER — ONDANSETRON HCL 4 MG/2ML IJ SOLN: 4.0000 mg | INTRAMUSCULAR | Status: DC | PRN

## 2016-04-07 MED ORDER — IBUPROFEN 400 MG PO TABS
400.0000 mg | ORAL_TABLET | Freq: Four times a day (QID) | ORAL | Status: DC | PRN
  Administered 2016-04-07 – 2016-04-08 (×2): 400 mg via ORAL
  Filled 2016-04-07 (×2): qty 1

## 2016-04-07 MED ORDER — CHLORDIAZEPOXIDE HCL 25 MG PO CAPS
25.0000 mg | ORAL_CAPSULE | Freq: Once | ORAL | Status: AC
  Administered 2016-04-07: 25 mg via ORAL
  Filled 2016-04-07: qty 1

## 2016-04-07 MED ORDER — FOLIC ACID 1 MG PO TABS
1.0000 mg | ORAL_TABLET | Freq: Every day | ORAL | Status: DC
  Administered 2016-04-07 – 2016-04-08 (×2): 1 mg via ORAL
  Filled 2016-04-07 (×2): qty 1

## 2016-04-07 MED ORDER — ONDANSETRON 4 MG PO TBDP
4.0000 mg | ORAL_TABLET | Freq: Three times a day (TID) | ORAL | Status: DC | PRN
  Administered 2016-04-07: 4 mg via ORAL
  Filled 2016-04-07: qty 1

## 2016-04-07 NOTE — ED Notes (Signed)
Pt. Has requested a gown to wear,  She is sweating and the paper scrubs are sticking to her.  Pt. Given a gown.

## 2016-04-07 NOTE — B[facility] Counselor (Signed)
This writer submitted supporting documentation to the following psychiatric facilities for pt placement: [facility], [facility], [facility], [facility]   Darcel Frane McNeil, MA 

## 2016-04-07 NOTE — ED Notes (Signed)
Reported to Dr. Clydene PughKnott that pt. Is having periods of sweating, tremors and nausea.  Vitals updated to Dr. Clydene PughKnott.  Pt. Given Gingerale.

## 2016-04-07 NOTE — ED Notes (Signed)
Mackey Birchwoodammy Johnson - family member, brought medicaid paperwork for pt to sign. She had to leave papers so she can go to work but will come back later to pick them up, on her way to [place]. Unable to give a time. She kept the visitor pass that was given to her.

## 2016-04-07 NOTE — ED Notes (Signed)
Patient was given a snack and drink, and a regular diet ordered for lunch. 

## 2016-04-07 NOTE — ED Notes (Signed)
Pt given ginger ale and Rice Krispie Treat.
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
## 2016-04-07 NOTE — ED Notes (Signed)
tts completed 

## 2016-04-07 NOTE — ED Notes (Signed)
Pt request that Mackey Birchwoodammy Johnson be contact upon discharge/transfer. [phone]

## 2016-04-08 LAB — POC URINE PREG, ED: Preg Test, Ur: NEGATIVE

## 2016-04-08 NOTE — ED Notes (Signed)
Dr Jacubowitz in w/pt. 

## 2016-04-08 NOTE — ED Provider Notes (Addendum)
Sleeping comfortably. No distress  Doug SouSam Oberia Beaudoin, MD 04/08/16 0911  10:50 AM patient is requesting to go home. She is presently alert and with poor Glasgow Coma Score 15 Stating [facility]. She is asking for prescription for Ativan however I don't feel that that is safe considering she came in with blood alcohol of 390 and is at risk for further alcohol abuse. Alcohol with combination of benzodiazepines potentially lethal. She denies wanting to harm self Results for orders placed or performed during the hospital encounter of 04/06/16  Comprehensive metabolic panel  Result Value Ref Range   Sodium 132 (L) 135 - 145 mmol/L   Potassium 3.6 3.5 - 5.1 mmol/L   Chloride 93 (L) 101 - 111 mmol/L   CO2 26 22 - 32 mmol/L   Glucose, Bld 95 65 - 99 mg/dL   BUN <5 (L) 6 - 20 mg/dL   Creatinine, Ser 1.610.59 0.44 - 1.00 mg/dL   Calcium 9.1 8.9 - 09.610.3 mg/dL   Total Protein 8.5 (H) 6.5 - 8.1 g/dL   Albumin 3.7 3.5 - 5.0 g/dL   AST 045103 (H) 15 - 41 U/L   ALT 48 14 - 54 U/L   Alkaline Phosphatase 67 38 - 126 U/L   Total Bilirubin 0.9 0.3 - 1.2 mg/dL   GFR calc non Af Amer >60 >60 mL/min   GFR calc Af Amer >60 >60 mL/min   Anion gap 13 5 - 15  Ethanol (ETOH)  Result Value Ref Range   Alcohol, Ethyl (B) 390 (HH) <5 mg/dL  CBC  Result Value Ref Range   WBC 8.5 4.0 - 10.5 K/uL   RBC 4.17 3.87 - 5.11 MIL/uL   Hemoglobin 14.6 12.0 - 15.0 g/dL   HCT 40.942.2 81.136.0 - 91.446.0 %   MCV 101.2 (H) 78.0 - 100.0 fL   MCH 35.0 (H) 26.0 - 34.0 pg   MCHC 34.6 30.0 - 36.0 g/dL   RDW 78.212.2 95.611.5 - 21.315.5 %   Platelets 152 150 - 400 K/uL  Urine rapid drug screen (hosp performed) ([facility])  Result Value Ref Range   Opiates NONE DETECTED NONE DETECTED   Cocaine NONE DETECTED NONE DETECTED   Benzodiazepines NONE DETECTED NONE DETECTED   Amphetamines NONE DETECTED NONE DETECTED   Tetrahydrocannabinol NONE DETECTED NONE DETECTED   Barbiturates NONE DETECTED NONE DETECTED   POC Urine Pregnancy, ED ([facility] NOT [facility])  Result Value Ref Range   Preg Test, Ur NEGATIVE NEGATIVE   Mm Digital Screening Bilateral  03/20/2016  CLINICAL DATA:  Screening. EXAM: DIGITAL SCREENING BILATERAL MAMMOGRAM WITH CAD COMPARISON:  Previous exam(s). ACR Breast Density Category b: There are scattered areas of fibroglandular density. FINDINGS: There are no findings suspicious for malignancy. Images were processed with CAD. IMPRESSION: No mammographic evidence of malignancy. A result letter of this screening mammogram will be mailed directly to the patient. RECOMMENDATION: Screening mammogram in one year. (Code:[id number]-01Y) BI-RADS CATEGORY  1: Negative. Electronically Signed   By: Norva PavlovElizabeth  Brown M.D.   On: 03/20/2016 09:00   She'll be discharged with instrutions to return prn  Doug SouSam Eliyohu Class, MD 04/08/16 1056

## 2016-04-08 NOTE — ED Notes (Signed)
Pt wearing her persona clothing - states she is continuing to attempt to reach someone for ride. Advised pt she may wait in room until 1220 then she may go to lobby and continue to attempt calling. Voiced understanding.

## 2016-04-08 NOTE — ED Notes (Signed)
Pt at nurses' desk attempting to reach someone for ride from ED. Advised pt she may return to her room and continue to call from her cell phone.

## 2016-04-08 NOTE — ED Notes (Signed)
Dr Ethelda ChickJacubowitz aware pt requesting to be d/c'd d/t states she has an app[facility]. States she came to ED to get Ativan to help w/ETOH withdrawal. Denies hx DT's or seizures. Denies SI/HI - states "I've never felt like I wanted to hurt myself." Pt noted to be calm, cooperative.

## 2016-04-08 NOTE — Discharge Instructions (Signed)
Alcohol Use Disorder Follow-up at [facility] as planned in 2 days. Return if you feel worse for any reason. Alcohol use disorder is a mental disorder. It is not a one-time incident of heavy drinking. Alcohol use disorder is the excessive and uncontrollable use of alcohol over time that leads to problems with functioning in one or more areas of daily living. People with this disorder risk harming themselves and others when they drink to excess. Alcohol use disorder also can cause other mental disorders, such as mood and anxiety disorders, and serious physical problems. People with alcohol use disorder often misuse other drugs.  Alcohol use disorder is common and widespread. Some people with this disorder drink alcohol to cope with or escape from negative life events. Others drink to relieve chronic pain or symptoms of mental illness. People with a family history of alcohol use disorder are at higher risk of losing control and using alcohol to excess.  Drinking too much alcohol can cause injury, accidents, and health problems. One drink can be too much when you are:  Working.  Pregnant or breastfeeding.  Taking medicines. Ask your doctor.  Driving or planning to drive. SYMPTOMS  Signs and symptoms of alcohol use disorder may include the following:   Consumption ofalcohol inlarger amounts or over a longer period of time than intended.  Multiple unsuccessful attempts to cutdown or control alcohol use.   A great deal of time spent obtaining alcohol, using alcohol, or recovering from the effects of alcohol (hangover).  A strong desire or urge to use alcohol (cravings).   Continued use of alcohol despite problems at work, school, or home because of alcohol use.   Continued use of alcohol despite problems in relationships because of alcohol use.  Continued use of alcohol in situations when it is physically hazardous, such as driving a car.  Continued use of alcohol despite  awareness of a physical or psychological problem that is likely related to alcohol use. Physical problems related to alcohol use can involve the brain, heart, liver, stomach, and intestines. Psychological problems related to alcohol use include intoxication, depression, anxiety, psychosis, delirium, and dementia.   The need for increased amounts of alcohol to achieve the same desired effect, or a decreased effect from the consumption of the same amount of alcohol (tolerance).  Withdrawal symptoms upon reducing or stopping alcohol use, or alcohol use to reduce or avoid withdrawal symptoms. Withdrawal symptoms include:  Racing heart.  Hand tremor.  Difficulty sleeping.  Nausea.  Vomiting.  Hallucinations.  Restlessness.  Seizures. DIAGNOSIS Alcohol use disorder is diagnosed through an assessment by your health care provider. Your health care provider may start by asking three or four questions to screen for excessive or problematic alcohol use. To confirm a diagnosis of alcohol use disorder, at least two symptoms must be present within a 7061-month period. The severity of alcohol use disorder depends on the number of symptoms:  Mild--two or three.  Moderate--four or five.  Severe--six or more. Your health care provider may perform a physical exam or use results from lab tests to see if you have physical problems resulting from alcohol use. Your health care provider may refer you to a mental health professional for evaluation. TREATMENT  Some people with alcohol use disorder are able to reduce their alcohol use to low-risk levels. Some people with alcohol use disorder need to quit drinking alcohol. When necessary, mental health professionals with specialized training in substance use treatment can help. Your health care provider  can help you decide how severe your alcohol use disorder is and what type of treatment you need. The following forms of treatment are available:    Detoxification. Detoxification involves the use of prescription medicines to prevent alcohol withdrawal symptoms in the first week after quitting. This is important for people with a history of symptoms of withdrawal and for heavy drinkers who are likely to have withdrawal symptoms. Alcohol withdrawal can be dangerous and, in severe cases, cause death. Detoxification is usually provided in a hospital or in-patient substance use treatment facility.  Counseling or talk therapy. Talk therapy is provided by substance use treatment counselors. It addresses the reasons people use alcohol and ways to keep them from drinking again. The goals of talk therapy are to help people with alcohol use disorder find healthy activities and ways to cope with life stress, to identify and avoid triggers for alcohol use, and to handle cravings, which can cause relapse.  Medicines.Different medicines can help treat alcohol use disorder through the following actions:  Decrease alcohol cravings.  Decrease the positive reward response felt from alcohol use.  Produce an uncomfortable physical reaction when alcohol is used (aversion therapy).  Support groups. Support groups are run by people who have quit drinking. They provide emotional support, advice, and guidance. These forms of treatment are often combined. Some people with alcohol use disorder benefit from intensive combination treatment provided by specialized substance use treatment centers. Both inpatient and outpatient treatment programs are available.   This information is not intended to replace advice given to you by your health care provider. Make sure you discuss any questions you have with your health care provider.   Document Released: 01/18/2005 Document Revised: 01/01/2015 Document Reviewed: 03/20/2013 Elsevier Interactive Patient Education [employer]

## 2016-05-25 DIAGNOSIS — N12 Tubulo-interstitial nephritis, not specified as acute or chronic: Secondary | ICD-10-CM

## 2016-05-25 DIAGNOSIS — J189 Pneumonia, unspecified organism: Secondary | ICD-10-CM | POA: Diagnosis not present

## 2016-05-25 DIAGNOSIS — M2391 Unspecified internal derangement of right knee: Secondary | ICD-10-CM

## 2016-05-25 DIAGNOSIS — M25561 Pain in right knee: Secondary | ICD-10-CM

## 2016-05-25 DIAGNOSIS — S8012XA Contusion of left lower leg, initial encounter: Secondary | ICD-10-CM | POA: Diagnosis not present

## 2016-05-25 DIAGNOSIS — N390 Urinary tract infection, site not specified: Secondary | ICD-10-CM | POA: Diagnosis not present

## 2016-05-25 DIAGNOSIS — F1910 Other psychoactive substance abuse, uncomplicated: Secondary | ICD-10-CM

## 2016-05-25 DIAGNOSIS — N10 Acute tubulo-interstitial nephritis: Secondary | ICD-10-CM | POA: Diagnosis not present

## 2016-05-25 DIAGNOSIS — A419 Sepsis, unspecified organism: Secondary | ICD-10-CM

## 2016-05-26 DIAGNOSIS — S8012XA Contusion of left lower leg, initial encounter: Secondary | ICD-10-CM

## 2016-05-26 DIAGNOSIS — J189 Pneumonia, unspecified organism: Secondary | ICD-10-CM

## 2016-05-26 DIAGNOSIS — N390 Urinary tract infection, site not specified: Secondary | ICD-10-CM

## 2016-05-26 DIAGNOSIS — F1910 Other psychoactive substance abuse, uncomplicated: Secondary | ICD-10-CM

## 2016-05-26 DIAGNOSIS — A419 Sepsis, unspecified organism: Secondary | ICD-10-CM

## 2016-05-26 DIAGNOSIS — N12 Tubulo-interstitial nephritis, not specified as acute or chronic: Secondary | ICD-10-CM

## 2017-02-21 ENCOUNTER — Other Ambulatory Visit: Payer: Self-pay | Admitting: Obstetrics & Gynecology

## 2017-02-21 DIAGNOSIS — Z1231 Encounter for screening mammogram for malignant neoplasm of breast: Secondary | ICD-10-CM

## 2017-03-19 ENCOUNTER — Ambulatory Visit: Payer: Self-pay

## 2017-06-06 ENCOUNTER — Encounter (INDEPENDENT_AMBULATORY_CARE_PROVIDER_SITE_OTHER): Payer: Self-pay

## 2017-06-06 ENCOUNTER — Ambulatory Visit (INDEPENDENT_AMBULATORY_CARE_PROVIDER_SITE_OTHER): Payer: BLUE CROSS/BLUE SHIELD | Admitting: Psychiatry

## 2017-06-06 ENCOUNTER — Encounter (HOSPITAL_COMMUNITY): Payer: Self-pay | Admitting: Psychiatry

## 2017-06-06 VITALS — BP 132/80 | HR 84 | Ht 62.5 in | Wt 236.6 lb

## 2017-06-06 DIAGNOSIS — F339 Major depressive disorder, recurrent, unspecified: Secondary | ICD-10-CM | POA: Diagnosis not present

## 2017-06-06 DIAGNOSIS — F411 Generalized anxiety disorder: Secondary | ICD-10-CM

## 2017-06-06 DIAGNOSIS — F331 Major depressive disorder, recurrent, moderate: Secondary | ICD-10-CM

## 2017-06-06 DIAGNOSIS — Z791 Long term (current) use of non-steroidal anti-inflammatories (NSAID): Secondary | ICD-10-CM

## 2017-06-06 DIAGNOSIS — F4310 Post-traumatic stress disorder, unspecified: Secondary | ICD-10-CM | POA: Diagnosis not present

## 2017-06-06 DIAGNOSIS — F1010 Alcohol abuse, uncomplicated: Secondary | ICD-10-CM

## 2017-06-06 DIAGNOSIS — Z79899 Other long term (current) drug therapy: Secondary | ICD-10-CM | POA: Diagnosis not present

## 2017-06-06 DIAGNOSIS — Z79891 Long term (current) use of opiate analgesic: Secondary | ICD-10-CM | POA: Diagnosis not present

## 2017-06-06 DIAGNOSIS — Z818 Family history of other mental and behavioral disorders: Secondary | ICD-10-CM

## 2017-06-06 MED ORDER — GABAPENTIN 100 MG PO CAPS: 100.0000 mg | ORAL_CAPSULE | Freq: Three times a day (TID) | ORAL | 0 refills | Status: DC

## 2017-06-06 MED ORDER — CITALOPRAM HYDROBROMIDE 40 MG PO TABS: 40.0000 mg | ORAL_TABLET | Freq: Every day | ORAL | 0 refills | Status: DC
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
## 2017-06-07 ENCOUNTER — Telehealth (HOSPITAL_COMMUNITY): Payer: Self-pay

## 2017-06-07 DIAGNOSIS — F411 Generalized anxiety disorder: Secondary | ICD-10-CM

## 2017-06-07 DIAGNOSIS — F331 Major depressive disorder, recurrent, moderate: Secondary | ICD-10-CM

## 2017-06-07 DIAGNOSIS — F1010 Alcohol abuse, uncomplicated: Secondary | ICD-10-CM

## 2017-06-07 NOTE — Telephone Encounter (Signed)
Medication management - 90 day order for patient's Citalopram requested as this is all her insurance will cover.

## 2017-06-08 MED ORDER — CITALOPRAM HYDROBROMIDE 40 MG PO TABS: 40.0000 mg | ORAL_TABLET | Freq: Every day | ORAL | 0 refills | Status: DC

## 2017-06-08 MED ORDER — GABAPENTIN 100 MG PO CAPS: 100.0000 mg | ORAL_CAPSULE | Freq: Three times a day (TID) | ORAL | 0 refills | Status: DC

## 2017-06-08 NOTE — Telephone Encounter (Signed)
I sent in the 90 day order because after speaking with the pharmacy her insurance will not pay for 30 days only 90 and patient can not afford to pay out of pocket.

## 2017-06-24 DIAGNOSIS — J181 Lobar pneumonia, unspecified organism: Secondary | ICD-10-CM | POA: Diagnosis not present

## 2017-06-24 DIAGNOSIS — R0902 Hypoxemia: Secondary | ICD-10-CM | POA: Diagnosis not present

## 2017-06-24 DIAGNOSIS — J690 Pneumonitis due to inhalation of food and vomit: Secondary | ICD-10-CM | POA: Diagnosis not present

## 2017-06-24 DIAGNOSIS — F17210 Nicotine dependence, cigarettes, uncomplicated: Secondary | ICD-10-CM | POA: Diagnosis not present

## 2017-06-25 DIAGNOSIS — J690 Pneumonitis due to inhalation of food and vomit: Secondary | ICD-10-CM | POA: Diagnosis not present

## 2017-06-25 DIAGNOSIS — F17210 Nicotine dependence, cigarettes, uncomplicated: Secondary | ICD-10-CM | POA: Diagnosis not present

## 2017-06-25 DIAGNOSIS — J181 Lobar pneumonia, unspecified organism: Secondary | ICD-10-CM | POA: Diagnosis not present

## 2017-06-25 DIAGNOSIS — R0902 Hypoxemia: Secondary | ICD-10-CM | POA: Diagnosis not present

## 2017-06-26 ENCOUNTER — Ambulatory Visit (HOSPITAL_COMMUNITY): Payer: Self-pay | Admitting: Licensed Clinical Social Worker

## 2017-06-26 DIAGNOSIS — F17210 Nicotine dependence, cigarettes, uncomplicated: Secondary | ICD-10-CM | POA: Diagnosis not present

## 2017-06-26 DIAGNOSIS — J690 Pneumonitis due to inhalation of food and vomit: Secondary | ICD-10-CM | POA: Diagnosis not present

## 2017-06-26 DIAGNOSIS — J181 Lobar pneumonia, unspecified organism: Secondary | ICD-10-CM | POA: Diagnosis not present

## 2017-06-26 DIAGNOSIS — R0902 Hypoxemia: Secondary | ICD-10-CM | POA: Diagnosis not present

## 2017-06-27 DIAGNOSIS — J181 Lobar pneumonia, unspecified organism: Secondary | ICD-10-CM | POA: Diagnosis not present

## 2017-06-27 DIAGNOSIS — F17210 Nicotine dependence, cigarettes, uncomplicated: Secondary | ICD-10-CM | POA: Diagnosis not present

## 2017-06-27 DIAGNOSIS — J690 Pneumonitis due to inhalation of food and vomit: Secondary | ICD-10-CM | POA: Diagnosis not present

## 2017-06-27 DIAGNOSIS — R0902 Hypoxemia: Secondary | ICD-10-CM | POA: Diagnosis not present

## 2017-06-28 DIAGNOSIS — J690 Pneumonitis due to inhalation of food and vomit: Secondary | ICD-10-CM | POA: Diagnosis not present

## 2017-06-28 DIAGNOSIS — F17210 Nicotine dependence, cigarettes, uncomplicated: Secondary | ICD-10-CM | POA: Diagnosis not present

## 2017-06-28 DIAGNOSIS — R0902 Hypoxemia: Secondary | ICD-10-CM | POA: Diagnosis not present

## 2017-06-28 DIAGNOSIS — J181 Lobar pneumonia, unspecified organism: Secondary | ICD-10-CM | POA: Diagnosis not present

## 2017-06-29 DIAGNOSIS — J181 Lobar pneumonia, unspecified organism: Secondary | ICD-10-CM | POA: Diagnosis not present

## 2017-06-29 DIAGNOSIS — J690 Pneumonitis due to inhalation of food and vomit: Secondary | ICD-10-CM | POA: Diagnosis not present

## 2017-06-29 DIAGNOSIS — R0902 Hypoxemia: Secondary | ICD-10-CM | POA: Diagnosis not present

## 2017-06-29 DIAGNOSIS — F17210 Nicotine dependence, cigarettes, uncomplicated: Secondary | ICD-10-CM | POA: Diagnosis not present

## 2017-07-23 ENCOUNTER — Ambulatory Visit (HOSPITAL_COMMUNITY): Payer: Self-pay | Admitting: Psychiatry

## 2017-09-24 ENCOUNTER — Other Ambulatory Visit (HOSPITAL_COMMUNITY): Payer: Self-pay | Admitting: Psychiatry

## 2017-09-24 DIAGNOSIS — F411 Generalized anxiety disorder: Secondary | ICD-10-CM

## 2017-09-24 DIAGNOSIS — F331 Major depressive disorder, recurrent, moderate: Secondary | ICD-10-CM

## 2017-10-05 ENCOUNTER — Other Ambulatory Visit (HOSPITAL_COMMUNITY): Payer: Self-pay | Admitting: Psychiatry

## 2017-10-05 DIAGNOSIS — F411 Generalized anxiety disorder: Secondary | ICD-10-CM

## 2017-10-05 DIAGNOSIS — F331 Major depressive disorder, recurrent, moderate: Secondary | ICD-10-CM

## 2017-10-05 DIAGNOSIS — F1010 Alcohol abuse, uncomplicated: Secondary | ICD-10-CM

## 2019-06-26 ENCOUNTER — Inpatient Hospital Stay (HOSPITAL_COMMUNITY)
Admission: EM | Admit: 2019-06-26 | Discharge: 2019-07-04 | DRG: 640 | Disposition: A | Discharge status: Home / Self Care | Payer: BC Managed Care – PPO | Source: Other Acute Inpatient Hospital | Attending: Family Medicine | Admitting: Family Medicine

## 2019-06-26 ENCOUNTER — Inpatient Hospital Stay (HOSPITAL_COMMUNITY): Payer: BC Managed Care – PPO

## 2019-06-26 DIAGNOSIS — F329 Major depressive disorder, single episode, unspecified: Secondary | ICD-10-CM | POA: Diagnosis present

## 2019-06-26 DIAGNOSIS — J9601 Acute respiratory failure with hypoxia: Secondary | ICD-10-CM | POA: Diagnosis present

## 2019-06-26 DIAGNOSIS — Z6841 Body Mass Index (BMI) 40.0 and over, adult: Secondary | ICD-10-CM

## 2019-06-26 DIAGNOSIS — T380X5A Adverse effect of glucocorticoids and synthetic analogues, initial encounter: Secondary | ICD-10-CM | POA: Diagnosis present

## 2019-06-26 DIAGNOSIS — E860 Dehydration: Secondary | ICD-10-CM | POA: Diagnosis present

## 2019-06-26 DIAGNOSIS — M6282 Rhabdomyolysis: Secondary | ICD-10-CM | POA: Diagnosis present

## 2019-06-26 DIAGNOSIS — E871 Hypo-osmolality and hyponatremia: Secondary | ICD-10-CM | POA: Diagnosis present

## 2019-06-26 DIAGNOSIS — E7800 Pure hypercholesterolemia, unspecified: Secondary | ICD-10-CM | POA: Diagnosis not present

## 2019-06-26 DIAGNOSIS — I10 Essential (primary) hypertension: Secondary | ICD-10-CM | POA: Diagnosis not present

## 2019-06-26 DIAGNOSIS — M19012 Primary osteoarthritis, left shoulder: Secondary | ICD-10-CM | POA: Diagnosis present

## 2019-06-26 DIAGNOSIS — N179 Acute kidney failure, unspecified: Secondary | ICD-10-CM | POA: Diagnosis present

## 2019-06-26 DIAGNOSIS — R52 Pain, unspecified: Secondary | ICD-10-CM

## 2019-06-26 DIAGNOSIS — G9341 Metabolic encephalopathy: Secondary | ICD-10-CM | POA: Diagnosis present

## 2019-06-26 DIAGNOSIS — Z22322 Carrier or suspected carrier of Methicillin resistant Staphylococcus aureus: Secondary | ICD-10-CM | POA: Diagnosis not present

## 2019-06-26 DIAGNOSIS — F1010 Alcohol abuse, uncomplicated: Secondary | ICD-10-CM | POA: Diagnosis present

## 2019-06-26 DIAGNOSIS — K7010 Alcoholic hepatitis without ascites: Secondary | ICD-10-CM

## 2019-06-26 DIAGNOSIS — E785 Hyperlipidemia, unspecified: Secondary | ICD-10-CM | POA: Diagnosis present

## 2019-06-26 DIAGNOSIS — G4733 Obstructive sleep apnea (adult) (pediatric): Secondary | ICD-10-CM | POA: Diagnosis not present

## 2019-06-26 DIAGNOSIS — D6959 Other secondary thrombocytopenia: Secondary | ICD-10-CM | POA: Diagnosis present

## 2019-06-26 DIAGNOSIS — R0602 Shortness of breath: Secondary | ICD-10-CM

## 2019-06-26 DIAGNOSIS — F10230 Alcohol dependence with withdrawal, uncomplicated: Secondary | ICD-10-CM | POA: Diagnosis not present

## 2019-06-26 DIAGNOSIS — I119 Hypertensive heart disease without heart failure: Secondary | ICD-10-CM | POA: Diagnosis present

## 2019-06-26 DIAGNOSIS — J9690 Respiratory failure, unspecified, unspecified whether with hypoxia or hypercapnia: Secondary | ICD-10-CM

## 2019-06-26 DIAGNOSIS — Z79899 Other long term (current) drug therapy: Secondary | ICD-10-CM | POA: Diagnosis not present

## 2019-06-26 DIAGNOSIS — F320 Major depressive disorder, single episode, mild: Secondary | ICD-10-CM | POA: Diagnosis not present

## 2019-06-26 DIAGNOSIS — N390 Urinary tract infection, site not specified: Secondary | ICD-10-CM | POA: Diagnosis present

## 2019-06-26 DIAGNOSIS — K7030 Alcoholic cirrhosis of liver without ascites: Secondary | ICD-10-CM | POA: Diagnosis present

## 2019-06-26 DIAGNOSIS — F10239 Alcohol dependence with withdrawal, unspecified: Secondary | ICD-10-CM | POA: Diagnosis not present

## 2019-06-26 DIAGNOSIS — D72829 Elevated white blood cell count, unspecified: Secondary | ICD-10-CM | POA: Diagnosis not present

## 2019-06-26 DIAGNOSIS — E162 Hypoglycemia, unspecified: Secondary | ICD-10-CM | POA: Diagnosis present

## 2019-06-26 DIAGNOSIS — F10939 Alcohol use, unspecified with withdrawal, unspecified: Secondary | ICD-10-CM | POA: Diagnosis present

## 2019-06-26 DIAGNOSIS — K760 Fatty (change of) liver, not elsewhere classified: Secondary | ICD-10-CM | POA: Diagnosis present

## 2019-06-26 DIAGNOSIS — Z20828 Contact with and (suspected) exposure to other viral communicable diseases: Secondary | ICD-10-CM | POA: Diagnosis present

## 2019-06-26 DIAGNOSIS — Z452 Encounter for adjustment and management of vascular access device: Secondary | ICD-10-CM

## 2019-06-26 DIAGNOSIS — K7460 Unspecified cirrhosis of liver: Secondary | ICD-10-CM

## 2019-06-26 DIAGNOSIS — E662 Morbid (severe) obesity with alveolar hypoventilation: Secondary | ICD-10-CM | POA: Diagnosis not present

## 2019-06-26 DIAGNOSIS — F419 Anxiety disorder, unspecified: Secondary | ICD-10-CM | POA: Diagnosis present

## 2019-06-26 DIAGNOSIS — D696 Thrombocytopenia, unspecified: Secondary | ICD-10-CM | POA: Diagnosis not present

## 2019-06-26 DIAGNOSIS — F32A Depression, unspecified: Secondary | ICD-10-CM | POA: Diagnosis present

## 2019-06-26 LAB — BASIC METABOLIC PANEL
Anion gap: 13 (ref 5–15)
Anion gap: 12 (ref 5–15)
BUN: 46 mg/dL — ABNORMAL HIGH (ref 6–20)
BUN: 49 mg/dL — ABNORMAL HIGH (ref 6–20)
CO2: 20 mmol/L — ABNORMAL LOW (ref 22–32)
CO2: 19 mmol/L — ABNORMAL LOW (ref 22–32)
Calcium: 7.4 mg/dL — ABNORMAL LOW (ref 8.9–10.3)
Calcium: 7.5 mg/dL — ABNORMAL LOW (ref 8.9–10.3)
Chloride: 75 mmol/L — ABNORMAL LOW (ref 98–111)
Chloride: 77 mmol/L — ABNORMAL LOW (ref 98–111)
Creatinine, Ser: 3.16 mg/dL — ABNORMAL HIGH (ref 0.44–1.00)
Creatinine, Ser: 2.92 mg/dL — ABNORMAL HIGH (ref 0.44–1.00)
GFR calc Af Amer: 19 mL/min — ABNORMAL LOW (ref >60)
GFR calc Af Amer: 21 mL/min — ABNORMAL LOW (ref >60)
GFR calc non Af Amer: 16 mL/min — ABNORMAL LOW (ref >60)
GFR calc non Af Amer: 18 mL/min — ABNORMAL LOW (ref >60)
Glucose, Bld: 64 mg/dL — ABNORMAL LOW (ref 70–99)
Glucose, Bld: 85 mg/dL (ref 70–99)
Potassium: 4.9 mmol/L (ref 3.5–5.1)
Potassium: 5.4 mmol/L — ABNORMAL HIGH (ref 3.5–5.1)
Sodium: 108 mmol/L — CL (ref 135–145)
Sodium: 108 mmol/L — CL (ref 135–145)

## 2019-06-26 LAB — HEPATIC FUNCTION PANEL
ALT: 53 U/L — ABNORMAL HIGH (ref 0–44)
AST: 261 U/L — ABNORMAL HIGH (ref 15–41)
Albumin: 2 g/dL — ABNORMAL LOW (ref 3.5–5.0)
Alkaline Phosphatase: 173 U/L — ABNORMAL HIGH (ref 38–126)
Bilirubin, Direct: 3.8 mg/dL — ABNORMAL HIGH (ref 0.0–0.2)
Indirect Bilirubin: 2.9 mg/dL — ABNORMAL HIGH (ref 0.3–0.9)
Total Bilirubin: 6.7 mg/dL — ABNORMAL HIGH (ref 0.3–1.2)
Total Protein: 5.8 g/dL — ABNORMAL LOW (ref 6.5–8.1)

## 2019-06-26 LAB — URINALYSIS, ROUTINE W REFLEX MICROSCOPIC
Bilirubin Urine: NEGATIVE
Glucose, UA: NEGATIVE mg/dL
Ketones, ur: NEGATIVE mg/dL
Nitrite: NEGATIVE
Protein, ur: 100 mg/dL — AB
Specific Gravity, Urine: 1.014 (ref 1.005–1.030)
pH: 5 (ref 5.0–8.0)

## 2019-06-26 LAB — CK
Total CK: 2140 U/L — ABNORMAL HIGH (ref 38–234)
Total CK: 1129 U/L — ABNORMAL HIGH (ref 38–234)

## 2019-06-26 LAB — GLUCOSE, CAPILLARY
Glucose-Capillary: 60 mg/dL — ABNORMAL LOW (ref 70–99)
Glucose-Capillary: 77 mg/dL (ref 70–99)
Glucose-Capillary: 88 mg/dL (ref 70–99)
Glucose-Capillary: 90 mg/dL (ref 70–99)

## 2019-06-26 LAB — AMMONIA: Ammonia: 28 umol/L (ref 9–35)

## 2019-06-26 LAB — OSMOLALITY, URINE: Osmolality, Ur: 264 mOsm/kg — ABNORMAL LOW (ref 300–900)

## 2019-06-26 LAB — POCT I-STAT, CHEM 8
BUN: 46 mg/dL — ABNORMAL HIGH (ref 6–20)
Calcium, Ion: 0.99 mmol/L — ABNORMAL LOW (ref 1.15–1.40)
Chloride: 79 mmol/L — ABNORMAL LOW (ref 98–111)
Creatinine, Ser: 2.7 mg/dL — ABNORMAL HIGH (ref 0.44–1.00)
Glucose, Bld: 91 mg/dL (ref 70–99)
HCT: 42 % (ref 36.0–46.0)
Hemoglobin: 14.3 g/dL (ref 12.0–15.0)
Potassium: 5.4 mmol/L — ABNORMAL HIGH (ref 3.5–5.1)
Sodium: 111 mmol/L — CL (ref 135–145)
TCO2: 21 mmol/L — ABNORMAL LOW (ref 22–32)

## 2019-06-26 LAB — PROTIME-INR
INR: 1.9 — ABNORMAL HIGH (ref 0.8–1.2)
Prothrombin Time: 21.1 seconds — ABNORMAL HIGH (ref 11.4–15.2)

## 2019-06-26 LAB — TSH: TSH: 2.685 u[IU]/mL (ref 0.350–4.500)

## 2019-06-26 LAB — MRSA PCR SCREENING: MRSA by PCR: POSITIVE — AB

## 2019-06-26 LAB — CBC
Hemoglobin: 12.4 g/dL (ref 12.0–15.0)
Platelets: 23 10*3/uL — CL (ref 150–400)
WBC: 29.5 10*3/uL — ABNORMAL HIGH (ref 4.0–10.5)
nRBC: 0 % (ref 0.0–0.2)

## 2019-06-26 LAB — OSMOLALITY: Osmolality: 245 mOsm/kg — CL (ref 275–295)

## 2019-06-26 MED ORDER — SODIUM CHLORIDE 0.9 % IV SOLN
1.0000 g | INTRAVENOUS | Status: DC
  Administered 2019-06-26 – 2019-06-28 (×3): 1 g via INTRAVENOUS
  Filled 2019-06-26: qty 10
  Filled 2019-06-26: qty 1
  Filled 2019-06-26 (×2): qty 10

## 2019-06-26 MED ORDER — WHITE PETROLATUM EX OINT
TOPICAL_OINTMENT | CUTANEOUS | Status: AC
  Filled 2019-06-26: qty 28.35

## 2019-06-26 MED ORDER — METHYLPREDNISOLONE SODIUM SUCC 40 MG IJ SOLR
40.0000 mg | Freq: Every day | INTRAMUSCULAR | Status: DC
  Administered 2019-06-26 – 2019-06-29 (×4): 40 mg via INTRAVENOUS
  Filled 2019-06-26 (×4): qty 1

## 2019-06-26 MED ORDER — DEXTROSE 50 % IV SOLN
INTRAVENOUS | Status: AC
  Administered 2019-06-26: 25 mL via INTRAVENOUS
  Filled 2019-06-26: qty 50

## 2019-06-26 MED ORDER — SODIUM CHLORIDE 3 % IV SOLN
INTRAVENOUS | Status: DC
  Administered 2019-06-26: 40 mL/h via INTRAVENOUS
  Filled 2019-06-26: qty 500

## 2019-06-26 MED ORDER — SODIUM CHLORIDE 0.9 % IV SOLN
INTRAVENOUS | Status: DC
  Administered 2019-06-26: 13:00:00 via INTRAVENOUS

## 2019-06-26 MED ORDER — FOLIC ACID 5 MG/ML IJ SOLN
1.0000 mg | Freq: Every day | INTRAMUSCULAR | Status: DC
  Administered 2019-06-26 – 2019-06-29 (×4): 1 mg via INTRAVENOUS
  Filled 2019-06-26 (×4): qty 0.2

## 2019-06-26 MED ORDER — INSULIN ASPART 100 UNIT/ML ~~LOC~~ SOLN
0.0000 [IU] | SUBCUTANEOUS | Status: DC
  Administered 2019-06-27: 2 [IU] via SUBCUTANEOUS
  Administered 2019-06-27: 4 [IU] via SUBCUTANEOUS
  Administered 2019-06-27 (×2): 2 [IU] via SUBCUTANEOUS
  Administered 2019-06-28: 4 [IU] via SUBCUTANEOUS
  Administered 2019-06-28 – 2019-07-03 (×14): 2 [IU] via SUBCUTANEOUS

## 2019-06-26 MED ORDER — ENOXAPARIN SODIUM 40 MG/0.4ML ~~LOC~~ SOLN: 40.0000 mg | SUBCUTANEOUS | Status: DC

## 2019-06-26 MED ORDER — DEXTROSE 50 % IV SOLN
25.0000 mL | Freq: Once | INTRAVENOUS | Status: AC
  Administered 2019-06-26 (×2): 25 mL via INTRAVENOUS

## 2019-06-26 MED ORDER — SODIUM CHLORIDE 0.9 % IV SOLN
2.0000 ug | Freq: Three times a day (TID) | INTRAVENOUS | Status: DC
  Administered 2019-06-26 – 2019-06-27 (×3): 2 ug via INTRAVENOUS
  Filled 2019-06-26 (×7): qty 0.5

## 2019-06-26 MED ORDER — PANTOPRAZOLE SODIUM 40 MG IV SOLR
40.0000 mg | Freq: Every day | INTRAVENOUS | Status: DC
  Administered 2019-06-26 – 2019-06-28 (×3): 40 mg via INTRAVENOUS
  Filled 2019-06-26 (×3): qty 40

## 2019-06-26 MED ORDER — MIDAZOLAM HCL 2 MG/2ML IJ SOLN
1.0000 mg | INTRAMUSCULAR | Status: DC | PRN
  Administered 2019-06-26: 2 mg via INTRAVENOUS
  Filled 2019-06-26: qty 2

## 2019-06-26 MED ORDER — CHLORHEXIDINE GLUCONATE CLOTH 2 % EX PADS
6.0000 | MEDICATED_PAD | Freq: Every day | CUTANEOUS | Status: AC
  Administered 2019-06-28 – 2019-07-01 (×3): 6 via TOPICAL

## 2019-06-26 MED ORDER — THIAMINE HCL 100 MG/ML IJ SOLN
100.0000 mg | Freq: Every day | INTRAMUSCULAR | Status: DC
  Administered 2019-06-26 – 2019-06-29 (×4): 100 mg via INTRAVENOUS
  Filled 2019-06-26 (×4): qty 2

## 2019-06-26 MED ORDER — HEPARIN SODIUM (PORCINE) 5000 UNIT/ML IJ SOLN: 5000.0000 [IU] | Freq: Three times a day (TID) | INTRAMUSCULAR | Status: DC

## 2019-06-26 MED ORDER — DEXTROSE-NACL 5-0.9 % IV SOLN
INTRAVENOUS | Status: DC
  Administered 2019-06-26 – 2019-06-28 (×3): via INTRAVENOUS

## 2019-06-26 MED ORDER — SODIUM CHLORIDE 3 % IV SOLN: INTRAVENOUS | Status: DC

## 2019-06-26 MED ORDER — MUPIROCIN 2 % EX OINT
1.0000 "application " | TOPICAL_OINTMENT | Freq: Two times a day (BID) | CUTANEOUS | Status: AC
  Administered 2019-06-26 – 2019-07-01 (×9): 1 via NASAL
  Filled 2019-06-26 (×2): qty 22
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
## 2019-06-26 NOTE — Progress Notes (Signed)
Inpatient Diabetes Program Recommendations  AACE/ADA: New Consensus Statement on Inpatient Glycemic Control (2015)  Target Ranges:  Prepandial:   less than 140 mg/dL      Peak postprandial:   less than 180 mg/dL (1-2 hours)      Critically ill patients:  140 - 180 mg/dL   Results for AVONNE, BERKERY (MRN [id number]) as of 06/26/2019 14:36  Ref. Range 06/26/2019 11:38 06/26/2019 13:21  Glucose-Capillary Latest Ref Range: 70 - 99 mg/dL 60 (L) 77   Review of Glycemic Control  Diabetes history: None  Current orders for Inpatient glycemic control: Novolog 0-24 units Q4  Inpatient Diabetes Program Recommendations:    Renal function elevated, however noted IV Solumedrol started 40 mg Daily. Glucose currently in the 70's.   Consider using Glycemic control order set Novolog 0-15 units Q4 hours.  Thanks,  Tama Headings RN, MSN, BC-ADM Inpatient Diabetes Coordinator Team Pager [phone] (8a-5p)

## 2019-06-26 NOTE — Progress Notes (Signed)
Pt found with urine catheter disconnected at the tape connection. Pt had pulled catheter.  Cleaned thoroughly with CHG and put back together.  Urine returned. Night shift aware. Will monitor patient.  Karsten Ro, RN

## 2019-06-26 NOTE — Progress Notes (Signed)
Labs reviewed, no osmolar gap to suggest toxic alcohol ingestion.  Switch fluids to D5NS@ 150 with CBG q4h.  Add 160cc hypertonic saline over 4 hours.  Again, goal will be to get her to around 110-112 mEq/dL by 2AM or so.

## 2019-06-27 ENCOUNTER — Inpatient Hospital Stay (HOSPITAL_COMMUNITY): Payer: BC Managed Care – PPO

## 2019-06-27 LAB — BASIC METABOLIC PANEL
Anion gap: 12 (ref 5–15)
Anion gap: 10 (ref 5–15)
Anion gap: 10 (ref 5–15)
BUN: 54 mg/dL — ABNORMAL HIGH (ref 6–20)
BUN: 51 mg/dL — ABNORMAL HIGH (ref 6–20)
BUN: 48 mg/dL — ABNORMAL HIGH (ref 6–20)
CO2: 18 mmol/L — ABNORMAL LOW (ref 22–32)
CO2: 18 mmol/L — ABNORMAL LOW (ref 22–32)
CO2: 18 mmol/L — ABNORMAL LOW (ref 22–32)
Calcium: 7.3 mg/dL — ABNORMAL LOW (ref 8.9–10.3)
Calcium: 7.4 mg/dL — ABNORMAL LOW (ref 8.9–10.3)
Calcium: 7.3 mg/dL — ABNORMAL LOW (ref 8.9–10.3)
Chloride: 82 mmol/L — ABNORMAL LOW (ref 98–111)
Chloride: 86 mmol/L — ABNORMAL LOW (ref 98–111)
Chloride: 88 mmol/L — ABNORMAL LOW (ref 98–111)
Creatinine, Ser: 1.77 mg/dL — ABNORMAL HIGH (ref 0.44–1.00)
Creatinine, Ser: 1.33 mg/dL — ABNORMAL HIGH (ref 0.44–1.00)
Creatinine, Ser: 1.07 mg/dL — ABNORMAL HIGH (ref 0.44–1.00)
GFR calc Af Amer: 38 mL/min — ABNORMAL LOW (ref >60)
GFR calc Af Amer: 54 mL/min — ABNORMAL LOW (ref >60)
GFR calc Af Amer: >60 mL/min (ref >60)
GFR calc non Af Amer: 33 mL/min — ABNORMAL LOW (ref >60)
GFR calc non Af Amer: 47 mL/min — ABNORMAL LOW (ref >60)
GFR calc non Af Amer: >60 mL/min (ref >60)
Glucose, Bld: 132 mg/dL — ABNORMAL HIGH (ref 70–99)
Glucose, Bld: 133 mg/dL — ABNORMAL HIGH (ref 70–99)
Glucose, Bld: 136 mg/dL — ABNORMAL HIGH (ref 70–99)
Potassium: 5.7 mmol/L — ABNORMAL HIGH (ref 3.5–5.1)
Potassium: 5.3 mmol/L — ABNORMAL HIGH (ref 3.5–5.1)
Potassium: 5.4 mmol/L — ABNORMAL HIGH (ref 3.5–5.1)
Sodium: 112 mmol/L — CL (ref 135–145)
Sodium: 114 mmol/L — CL (ref 135–145)
Sodium: 116 mmol/L — CL (ref 135–145)

## 2019-06-27 LAB — POCT I-STAT, CHEM 8
BUN: 45 mg/dL — ABNORMAL HIGH (ref 6–20)
BUN: 48 mg/dL — ABNORMAL HIGH (ref 6–20)
Calcium, Ion: 0.98 mmol/L — ABNORMAL LOW (ref 1.15–1.40)
Calcium, Ion: 1.03 mmol/L — ABNORMAL LOW (ref 1.15–1.40)
Chloride: 81 mmol/L — ABNORMAL LOW (ref 98–111)
Chloride: 81 mmol/L — ABNORMAL LOW (ref 98–111)
Creatinine, Ser: 2.1 mg/dL — ABNORMAL HIGH (ref 0.44–1.00)
Creatinine, Ser: 2.4 mg/dL — ABNORMAL HIGH (ref 0.44–1.00)
Glucose, Bld: 127 mg/dL — ABNORMAL HIGH (ref 70–99)
Glucose, Bld: 143 mg/dL — ABNORMAL HIGH (ref 70–99)
HCT: 40 % (ref 36.0–46.0)
HCT: 41 % (ref 36.0–46.0)
Hemoglobin: 13.6 g/dL (ref 12.0–15.0)
Hemoglobin: 13.9 g/dL (ref 12.0–15.0)
Potassium: 5.1 mmol/L (ref 3.5–5.1)
Potassium: 5.2 mmol/L — ABNORMAL HIGH (ref 3.5–5.1)
Sodium: 111 mmol/L — CL (ref 135–145)
Sodium: 112 mmol/L — CL (ref 135–145)
TCO2: 21 mmol/L — ABNORMAL LOW (ref 22–32)
TCO2: 23 mmol/L (ref 22–32)

## 2019-06-27 LAB — CBC
HCT: 32.4 % — ABNORMAL LOW (ref 36.0–46.0)
Hemoglobin: 12.1 g/dL (ref 12.0–15.0)
MCH: 35.8 pg — ABNORMAL HIGH (ref 26.0–34.0)
MCHC: 37.3 g/dL — ABNORMAL HIGH (ref 30.0–36.0)
MCV: 95.9 fL (ref 80.0–100.0)
Platelets: 22 10*3/uL — CL (ref 150–400)
RBC: 3.38 MIL/uL — ABNORMAL LOW (ref 3.87–5.11)
RDW: 15.4 % (ref 11.5–15.5)
WBC: 23.8 10*3/uL — ABNORMAL HIGH (ref 4.0–10.5)
nRBC: 0 % (ref 0.0–0.2)

## 2019-06-27 LAB — HEPATIC FUNCTION PANEL
ALT: 55 U/L — ABNORMAL HIGH (ref 0–44)
AST: 217 U/L — ABNORMAL HIGH (ref 15–41)
Albumin: 2 g/dL — ABNORMAL LOW (ref 3.5–5.0)
Alkaline Phosphatase: 159 U/L — ABNORMAL HIGH (ref 38–126)
Bilirubin, Direct: 3.8 mg/dL — ABNORMAL HIGH (ref 0.0–0.2)
Indirect Bilirubin: 3.4 mg/dL — ABNORMAL HIGH (ref 0.3–0.9)
Total Bilirubin: 7.2 mg/dL — ABNORMAL HIGH (ref 0.3–1.2)
Total Protein: 5.9 g/dL — ABNORMAL LOW (ref 6.5–8.1)

## 2019-06-27 LAB — POCT I-STAT 4, (NA,K, GLUC, HGB,HCT)
Glucose, Bld: 161 mg/dL — ABNORMAL HIGH (ref 70–99)
HCT: 40 % (ref 36.0–46.0)
Hemoglobin: 13.6 g/dL (ref 12.0–15.0)
Potassium: 6 mmol/L — ABNORMAL HIGH (ref 3.5–5.1)
Sodium: 113 mmol/L — CL (ref 135–145)

## 2019-06-27 LAB — GLUCOSE, CAPILLARY
Glucose-Capillary: 116 mg/dL — ABNORMAL HIGH (ref 70–99)
Glucose-Capillary: 130 mg/dL — ABNORMAL HIGH (ref 70–99)
Glucose-Capillary: 141 mg/dL — ABNORMAL HIGH (ref 70–99)
Glucose-Capillary: 147 mg/dL — ABNORMAL HIGH (ref 70–99)
Glucose-Capillary: 150 mg/dL — ABNORMAL HIGH (ref 70–99)
Glucose-Capillary: 189 mg/dL — ABNORMAL HIGH (ref 70–99)

## 2019-06-27 LAB — OSMOLALITY: Osmolality: 257 mOsm/kg — ABNORMAL LOW (ref 275–295)

## 2019-06-27 LAB — PROTIME-INR
INR: 1.8 — ABNORMAL HIGH (ref 0.8–1.2)
Prothrombin Time: 20.4 seconds — ABNORMAL HIGH (ref 11.4–15.2)

## 2019-06-27 LAB — OSMOLALITY, URINE: Osmolality, Ur: 337 mOsm/kg (ref 300–900)

## 2019-06-27 LAB — CK: Total CK: 452 U/L — ABNORMAL HIGH (ref 38–234)

## 2019-06-27 LAB — SODIUM: Sodium: 111 mmol/L — CL (ref 135–145)

## 2019-06-27 MED ORDER — DEXTROSE 50 % IV SOLN
1.0000 | Freq: Once | INTRAVENOUS | Status: AC
  Administered 2019-06-27: 50 mL via INTRAVENOUS
  Filled 2019-06-27: qty 50

## 2019-06-27 MED ORDER — INSULIN REGULAR BOLUS VIA INFUSION: 10.0000 [IU] | Freq: Once | INTRAVENOUS | Status: DC

## 2019-06-27 MED ORDER — SODIUM CHLORIDE 0.9 % IV SOLN
1.0000 ug | Freq: Three times a day (TID) | INTRAVENOUS | Status: AC
  Administered 2019-06-27 – 2019-06-28 (×4): 1 ug via INTRAVENOUS
  Filled 2019-06-27 (×5): qty 0.25

## 2019-06-27 MED ORDER — SODIUM CHLORIDE 3 % IV SOLN
INTRAVENOUS | Status: AC
  Administered 2019-06-27: 40 mL/h via INTRAVENOUS
  Filled 2019-06-27: qty 500

## 2019-06-27 MED ORDER — INSULIN ASPART 100 UNIT/ML ~~LOC~~ SOLN
10.0000 [IU] | Freq: Once | SUBCUTANEOUS | Status: AC
  Administered 2019-06-27: 10 [IU] via INTRAVENOUS

## 2019-06-27 MED ORDER — CALCIUM GLUCONATE-NACL 1-0.675 GM/50ML-% IV SOLN
1.0000 g | Freq: Once | INTRAVENOUS | Status: AC
  Administered 2019-06-27: 1000 mg via INTRAVENOUS
  Filled 2019-06-27: qty 50

## 2019-06-27 NOTE — Progress Notes (Signed)
The patient was placed on the CPAP per order. The patient is lethargic. MD aware. The patient's mitten restraints were removed per protocol for CPAP use. The patient's RN is aware and okay with removal

## 2019-06-27 NOTE — Progress Notes (Signed)
Questionable hemolysis with 0800 potassium sample. RN called for STAT BMP for verification. Called lab multiple times regarding delay in collecting. Still not received results. Awaiting for verification before administering meds to pt.
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
## 2019-06-28 ENCOUNTER — Inpatient Hospital Stay (HOSPITAL_COMMUNITY): Payer: BC Managed Care – PPO

## 2019-06-28 LAB — HEPATIC FUNCTION PANEL
ALT: 47 U/L — ABNORMAL HIGH (ref 0–44)
AST: 146 U/L — ABNORMAL HIGH (ref 15–41)
Albumin: 1.9 g/dL — ABNORMAL LOW (ref 3.5–5.0)
Alkaline Phosphatase: 150 U/L — ABNORMAL HIGH (ref 38–126)
Bilirubin, Direct: 2.8 mg/dL — ABNORMAL HIGH (ref 0.0–0.2)
Indirect Bilirubin: 3.4 mg/dL — ABNORMAL HIGH (ref 0.3–0.9)
Total Bilirubin: 6.2 mg/dL — ABNORMAL HIGH (ref 0.3–1.2)
Total Protein: 5.8 g/dL — ABNORMAL LOW (ref 6.5–8.1)

## 2019-06-28 LAB — CBC WITH DIFFERENTIAL/PLATELET
Abs Immature Granulocytes: 0.27 10*3/uL — ABNORMAL HIGH (ref 0.00–0.07)
Basophils Absolute: 0 10*3/uL (ref 0.0–0.1)
Basophils Relative: 0 %
Eosinophils Absolute: 0 10*3/uL (ref 0.0–0.5)
Eosinophils Relative: 0 %
HCT: 31.8 % — ABNORMAL LOW (ref 36.0–46.0)
Hemoglobin: 11.5 g/dL — ABNORMAL LOW (ref 12.0–15.0)
Immature Granulocytes: 2 %
Lymphocytes Relative: 2 %
Lymphs Abs: 0.3 10*3/uL — ABNORMAL LOW (ref 0.7–4.0)
MCH: 35.3 pg — ABNORMAL HIGH (ref 26.0–34.0)
MCHC: 36.2 g/dL — ABNORMAL HIGH (ref 30.0–36.0)
MCV: 97.5 fL (ref 80.0–100.0)
Monocytes Absolute: 1.2 10*3/uL — ABNORMAL HIGH (ref 0.1–1.0)
Monocytes Relative: 7 %
Neutro Abs: 16.3 10*3/uL — ABNORMAL HIGH (ref 1.7–7.7)
Neutrophils Relative %: 89 %
Platelets: 29 10*3/uL — CL (ref 150–400)
RBC: 3.26 MIL/uL — ABNORMAL LOW (ref 3.87–5.11)
RDW: 15.8 % — ABNORMAL HIGH (ref 11.5–15.5)
WBC: 18.1 10*3/uL — ABNORMAL HIGH (ref 4.0–10.5)
nRBC: 0 % (ref 0.0–0.2)

## 2019-06-28 LAB — BASIC METABOLIC PANEL
Anion gap: 10 (ref 5–15)
Anion gap: 10 (ref 5–15)
BUN: 45 mg/dL — ABNORMAL HIGH (ref 6–20)
BUN: 41 mg/dL — ABNORMAL HIGH (ref 6–20)
CO2: 20 mmol/L — ABNORMAL LOW (ref 22–32)
CO2: 17 mmol/L — ABNORMAL LOW (ref 22–32)
Calcium: 7.6 mg/dL — ABNORMAL LOW (ref 8.9–10.3)
Calcium: 7.5 mg/dL — ABNORMAL LOW (ref 8.9–10.3)
Chloride: 89 mmol/L — ABNORMAL LOW (ref 98–111)
Chloride: 92 mmol/L — ABNORMAL LOW (ref 98–111)
Creatinine, Ser: 1.12 mg/dL — ABNORMAL HIGH (ref 0.44–1.00)
Creatinine, Ser: 0.85 mg/dL (ref 0.44–1.00)
GFR calc Af Amer: >60 mL/min (ref >60)
GFR calc Af Amer: >60 mL/min (ref >60)
GFR calc non Af Amer: 58 mL/min — ABNORMAL LOW (ref >60)
GFR calc non Af Amer: >60 mL/min (ref >60)
Glucose, Bld: 139 mg/dL — ABNORMAL HIGH (ref 70–99)
Glucose, Bld: 133 mg/dL — ABNORMAL HIGH (ref 70–99)
Potassium: 5 mmol/L (ref 3.5–5.1)
Potassium: 5 mmol/L (ref 3.5–5.1)
Sodium: 119 mmol/L — CL (ref 135–145)
Sodium: 119 mmol/L — CL (ref 135–145)

## 2019-06-28 LAB — PHOSPHORUS: Phosphorus: 3.5 mg/dL (ref 2.5–4.6)

## 2019-06-28 LAB — PROTIME-INR
INR: 1.7 — ABNORMAL HIGH (ref 0.8–1.2)
Prothrombin Time: 19.8 seconds — ABNORMAL HIGH (ref 11.4–15.2)

## 2019-06-28 LAB — SODIUM
Sodium: 121 mmol/L — ABNORMAL LOW (ref 135–145)
Sodium: 119 mmol/L — CL (ref 135–145)

## 2019-06-28 LAB — HEPATITIS PANEL, ACUTE
HCV Ab: <0.1 s/co ratio (ref 0.0–0.9)
Hep A IgM: NEGATIVE
Hep B C IgM: NEGATIVE
Hepatitis B Surface Ag: NEGATIVE

## 2019-06-28 LAB — GLUCOSE, CAPILLARY
Glucose-Capillary: 120 mg/dL — ABNORMAL HIGH (ref 70–99)
Glucose-Capillary: 133 mg/dL — ABNORMAL HIGH (ref 70–99)
Glucose-Capillary: 133 mg/dL — ABNORMAL HIGH (ref 70–99)
Glucose-Capillary: 143 mg/dL — ABNORMAL HIGH (ref 70–99)
Glucose-Capillary: 145 mg/dL — ABNORMAL HIGH (ref 70–99)
Glucose-Capillary: 176 mg/dL — ABNORMAL HIGH (ref 70–99)

## 2019-06-28 LAB — HIV ANTIBODY (ROUTINE TESTING W REFLEX): HIV Screen 4th Generation wRfx: NONREACTIVE

## 2019-06-28 LAB — MAGNESIUM: Magnesium: 1.7 mg/dL (ref 1.7–2.4)

## 2019-06-28 MED ORDER — OXYCODONE-ACETAMINOPHEN 5-325 MG PO TABS
1.0000 | ORAL_TABLET | ORAL | Status: DC | PRN
  Administered 2019-06-28 – 2019-07-04 (×13): 1 via ORAL
  Filled 2019-06-28 (×14): qty 1

## 2019-06-28 NOTE — Progress Notes (Signed)
eLink Physician-Brief Progress Note Patient Name: Tiffany Howell DOB: 12-30-1970 MRN: [id number]   Date of Service  06/28/2019  HPI/Events of Note  48 year old female admitted with severe hyponatremia probably secondary to alcohol abuse.   eICU Interventions  Serum sodium 119, rising appropriately after admission sodium of 108 on 06/26/2019.  Okay to sodium correction of 126 until noon 06/29/2019.      Intervention Category Intermediate Interventions: Electrolyte abnormality - evaluation and management;Communication with other healthcare providers and/or family Evaluation Type: Other  [name] 06/28/2019, 8:19 PM

## 2019-06-28 NOTE — Progress Notes (Signed)
CRITICAL VALUE ALERT  Critical Value:  Sodium 119  Date & Time Notied:  06/28/19 1948  Provider Notified: Warren Lacy ([place])  Orders Received/Actions taken: no new orders at this time.   RN will continue to monitor pt closely.

## 2019-06-28 NOTE — Progress Notes (Signed)
CSW received consult for assistance in identifying point of contact for patient. CSW reached out to both patient's brother and daughter listed in contacts and no answer, lvm.   CSW reached out to [employer] listed on emergency contacts, he reports he is patient's roommate and could be point of contact for her. He confirms that patient and her husband Tiffany Howell have split and no longer are speaking, and that is the reason for lack of family involvement currently. He reports staff can reach out to him for any medical updates on patient, as family is currently estranged from patient. Tiffany Howell also reports the current address we have for patient on file is her husband's address that she no longer lives at, patient is currently living with [place] [facility] in [place].   [place], [place]
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
## 2019-06-28 NOTE — Evaluation (Signed)
Physical Therapy Evaluation Patient Details Name: Tiffany Howell MRN: [id number] DOB: 1971-08-15 Today's Date: 06/28/2019   History of Present Illness  48 y/o F who presented to [facility] on 7/2 after EMS called out for stroke symptoms.  Pt had reportedly been on the floor x3 hours, decreased PO intake, diarrhea, hypoglycemia.  Work up notable for severe hyponatremia, hypoglycemia, AKI / rhabdomyolysis.  Additional concern for ETOH withdrawal.   Clinical Impression  PTA pt living with a room mate who is works and is not always there. She lives in single story home with ramped entrance and was independent in ambulation without AD and all ADLs. Pt is currently limited in safe mobility by decrease cognition (safety awareness) as well as decreased strength and endurance. Pt is currently min A for bed mobility, transfers and lateral stepping 3 feet towards HoB. Pt reports apprehension with ambulation due to increased falling. Pt unclear if the falling is limited to event that brought her to the hospital or if the falling is more long standing. PT recommends HHPT level care if 24 hour assist can be provided, however if not pt will need SNF level rehab for safety with mobility. PT will continue to follow acutely to progress mobility.     Follow Up Recommendations Home health PT;Supervision/Assistance - 24 hour    Equipment Recommendations  Rolling walker with 5" wheels;3in1 (PT)    Recommendations for Other Services OT consult     Precautions / Restrictions Precautions Precautions: Fall Restrictions Weight Bearing Restrictions: No      Mobility  Bed Mobility Overal bed mobility: Needs Assistance Bed Mobility: Supine to Sit;Sit to Supine     Supine to sit: Min assist Sit to supine: Mod assist   General bed mobility comments: minA for LE management off bed and pulling to upright, modA required for assistance of LE back into bed  Transfers Overall transfer level: Needs assistance Equipment  used: Rolling walker (2 wheeled) Transfers: Sit to/from Stand Sit to Stand: Min assist         General transfer comment: minA for power up and steadying in RW  Ambulation/Gait Ambulation/Gait assistance: Min assist Gait Distance (Feet): 3 Feet Assistive device: Rolling walker (2 wheeled) Gait Pattern/deviations: Step-to pattern;Decreased step length - right;Decreased step length - left;Shuffle;Antalgic Gait velocity: slowed Gait velocity interpretation: <1.31 ft/sec, indicative of household ambulator General Gait Details: minA for steadying with lateral stepping 3 feet along EoB, pt apprehensive about ambulation away from bed      Balance Overall balance assessment: Needs assistance Sitting-balance support: Feet supported;No upper extremity supported;Feet unsupported Sitting balance-Leahy Scale: Fair     Standing balance support: Single extremity supported;Bilateral upper extremity supported;During functional activity Standing balance-Leahy Scale: Poor Standing balance comment: requires at least single UE support to maintain balance                             Pertinent Vitals/Pain Pain Assessment: Faces Faces Pain Scale: Hurts little more Pain Location: L shoulder, back, knees  Pain Descriptors / Indicators: Grimacing;Guarding Pain Intervention(s): Limited activity within patient's tolerance;Monitored during session;Repositioned    Home Living Family/patient expects to be discharged to:: Private residence Living Arrangements: Non-relatives/Friends Available Help at Discharge: Available PRN/intermittently;Friend(s) Type of Home: House Home Access: Ramped entrance     Home Layout: One level Home Equipment: None      Prior Function Level of Independence: Independent  Extremity/Trunk Assessment   Upper Extremity Assessment Upper Extremity Assessment: LUE deficits/detail LUE Deficits / Details: imaging reveals shoulder OA, pt  unable to flex/ab shoulder past 70 degrees LUE: Unable to fully assess due to pain    Lower Extremity Assessment Lower Extremity Assessment: Generalized weakness       Communication   Communication: No difficulties  Cognition Arousal/Alertness: Lethargic Behavior During Therapy: Flat affect Overall Cognitive Status: Impaired/Different from baseline Area of Impairment: Attention;Memory;Following commands;Awareness;Problem solving                   Current Attention Level: Selective Memory: Decreased short-term memory(can't remember events leading up to hospitalization ) Following Commands: Follows one step commands with increased time;Follows multi-step commands with increased time   Awareness: Emergent Problem Solving: Slow processing;Decreased initiation;Difficulty sequencing;Requires verbal cues;Requires tactile cues General Comments: Does not remember what happened before she came into the hospital, is very slow in processing commands and requires increased time and multimodal cues for command follow      General Comments General comments (skin integrity, edema, etc.): Pt with brusing over bilateral UE and LE, and pt reports aching everywhere, VSS        Assessment/Plan    PT Assessment Patient needs continued PT services  PT Problem List Decreased strength;Decreased range of motion;Decreased activity tolerance;Decreased balance;Decreased mobility;Decreased cognition;Decreased safety awareness;Pain       PT Treatment Interventions DME instruction;Gait training;Functional mobility training;Therapeutic activities;Therapeutic exercise;Balance training;Cognitive remediation;Patient/family education    PT Goals (Current goals can be found in the Care Plan section)  Acute Rehab PT Goals Patient Stated Goal: remember what happened PT Goal Formulation: With patient Time For Goal Achievement: 07/12/19 Potential to Achieve Goals: Fair    Frequency Min 3X/week    Barriers to discharge Decreased caregiver support         AM-PAC PT "6 Clicks" Mobility  Outcome Measure Help needed turning from your back to your side while in a flat bed without using bedrails?: None Help needed moving from lying on your back to sitting on the side of a flat bed without using bedrails?: A Little Help needed moving to and from a bed to a chair (including a wheelchair)?: A Little Help needed standing up from a chair using your arms (e.g., wheelchair or bedside chair)?: A Little Help needed to walk in hospital room?: A Little Help needed climbing 3-5 steps with a railing? : A Lot 6 Click Score: 18    End of Session Equipment Utilized During Treatment: Gait belt Activity Tolerance: Patient tolerated treatment well Patient left: in bed;with call bell/phone within reach;with bed alarm set Nurse Communication: Mobility status PT Visit Diagnosis: Unsteadiness on feet (R26.81);Other abnormalities of gait and mobility (R26.89);Muscle weakness (generalized) (M62.81);History of falling (Z91.81);Difficulty in walking, not elsewhere classified (R26.2);Pain Pain - Right/Left: Left Pain - part of body: Shoulder(back and bilateral knees)    Time: [id number] PT Time Calculation (min) (ACUTE ONLY): 27 min   Charges:   PT Evaluation $PT Eval Moderate Complexity: 1 Mod PT Treatments $Gait Training: 8-22 mins        Caressa Scearce B. Beverely RisenVan Fleet PT, DPT Acute Rehabilitation Services Pager [phone] Office [phone]   [place]on Alaslizabeth B Van Fleet 06/28/2019, 3:31 PM

## 2019-06-29 ENCOUNTER — Inpatient Hospital Stay (HOSPITAL_COMMUNITY): Payer: BC Managed Care – PPO

## 2019-06-29 LAB — GLUCOSE, CAPILLARY
Glucose-Capillary: 115 mg/dL — ABNORMAL HIGH (ref 70–99)
Glucose-Capillary: 118 mg/dL — ABNORMAL HIGH (ref 70–99)
Glucose-Capillary: 123 mg/dL — ABNORMAL HIGH (ref 70–99)
Glucose-Capillary: 128 mg/dL — ABNORMAL HIGH (ref 70–99)
Glucose-Capillary: 138 mg/dL — ABNORMAL HIGH (ref 70–99)
Glucose-Capillary: 146 mg/dL — ABNORMAL HIGH (ref 70–99)
Glucose-Capillary: 94 mg/dL (ref 70–99)

## 2019-06-29 LAB — CBC WITH DIFFERENTIAL/PLATELET
Abs Immature Granulocytes: 0.26 10*3/uL — ABNORMAL HIGH (ref 0.00–0.07)
Basophils Absolute: 0 10*3/uL (ref 0.0–0.1)
Basophils Relative: 0 %
Eosinophils Absolute: 0 10*3/uL (ref 0.0–0.5)
Eosinophils Relative: 0 %
HCT: 34.2 % — ABNORMAL LOW (ref 36.0–46.0)
Hemoglobin: 12 g/dL (ref 12.0–15.0)
Immature Granulocytes: 2 %
Lymphocytes Relative: 3 %
Lymphs Abs: 0.5 10*3/uL — ABNORMAL LOW (ref 0.7–4.0)
MCH: 35.6 pg — ABNORMAL HIGH (ref 26.0–34.0)
MCHC: 35.1 g/dL (ref 30.0–36.0)
MCV: 101.5 fL — ABNORMAL HIGH (ref 80.0–100.0)
Monocytes Absolute: 1.3 10*3/uL — ABNORMAL HIGH (ref 0.1–1.0)
Monocytes Relative: 9 %
Neutro Abs: 12.8 10*3/uL — ABNORMAL HIGH (ref 1.7–7.7)
Neutrophils Relative %: 86 %
Platelets: 37 10*3/uL — ABNORMAL LOW (ref 150–400)
RBC: 3.37 MIL/uL — ABNORMAL LOW (ref 3.87–5.11)
RDW: 16 % — ABNORMAL HIGH (ref 11.5–15.5)
WBC: 14.9 10*3/uL — ABNORMAL HIGH (ref 4.0–10.5)
nRBC: 0 % (ref 0.0–0.2)

## 2019-06-29 LAB — BASIC METABOLIC PANEL
Anion gap: 7 (ref 5–15)
BUN: 30 mg/dL — ABNORMAL HIGH (ref 6–20)
CO2: 24 mmol/L (ref 22–32)
Calcium: 8.1 mg/dL — ABNORMAL LOW (ref 8.9–10.3)
Chloride: 92 mmol/L — ABNORMAL LOW (ref 98–111)
Creatinine, Ser: 0.74 mg/dL (ref 0.44–1.00)
GFR calc Af Amer: >60 mL/min (ref >60)
GFR calc non Af Amer: >60 mL/min (ref >60)
Glucose, Bld: 103 mg/dL — ABNORMAL HIGH (ref 70–99)
Potassium: 4.7 mmol/L (ref 3.5–5.1)
Sodium: 123 mmol/L — ABNORMAL LOW (ref 135–145)

## 2019-06-29 LAB — HEPATIC FUNCTION PANEL
ALT: 74 U/L — ABNORMAL HIGH (ref 0–44)
AST: 161 U/L — ABNORMAL HIGH (ref 15–41)
Albumin: 2 g/dL — ABNORMAL LOW (ref 3.5–5.0)
Alkaline Phosphatase: 167 U/L — ABNORMAL HIGH (ref 38–126)
Bilirubin, Direct: 3.3 mg/dL — ABNORMAL HIGH (ref 0.0–0.2)
Indirect Bilirubin: 3.3 mg/dL — ABNORMAL HIGH (ref 0.3–0.9)
Total Bilirubin: 6.6 mg/dL — ABNORMAL HIGH (ref 0.3–1.2)
Total Protein: 6.3 g/dL — ABNORMAL LOW (ref 6.5–8.1)

## 2019-06-29 LAB — SODIUM: Sodium: 120 mmol/L — ABNORMAL LOW (ref 135–145)

## 2019-06-29 LAB — PROTIME-INR
INR: 1.5 — ABNORMAL HIGH (ref 0.8–1.2)
Prothrombin Time: 17.9 seconds — ABNORMAL HIGH (ref 11.4–15.2)

## 2019-06-29 LAB — SARS CORONAVIRUS 2 BY RT PCR (HOSPITAL ORDER, PERFORMED IN ~~LOC~~ HOSPITAL LAB): SARS Coronavirus 2: NEGATIVE

## 2019-06-29 MED ORDER — SODIUM CHLORIDE 1 G PO TABS
1.0000 g | ORAL_TABLET | Freq: Three times a day (TID) | ORAL | Status: DC
  Administered 2019-06-29 – 2019-07-04 (×15): 1 g via ORAL
  Filled 2019-06-29 (×21): qty 1

## 2019-06-29 MED ORDER — MIDAZOLAM HCL 2 MG/2ML IJ SOLN: 1.0000 mg | INTRAMUSCULAR | Status: DC | PRN

## 2019-06-29 MED ORDER — VITAMIN B-1 100 MG PO TABS
100.0000 mg | ORAL_TABLET | Freq: Every day | ORAL | Status: DC
  Administered 2019-06-30 – 2019-07-04 (×5): 100 mg via ORAL
  Filled 2019-06-29 (×7): qty 1

## 2019-06-29 MED ORDER — FOLIC ACID 1 MG PO TABS
1.0000 mg | ORAL_TABLET | Freq: Every day | ORAL | Status: DC
  Administered 2019-06-30 – 2019-07-04 (×5): 1 mg via ORAL
  Filled 2019-06-29 (×7): qty 1

## 2019-06-29 NOTE — Progress Notes (Signed)
PROGRESS NOTE    Tiffany Howell  OFB:[id number] DOB: 18-Nov-1971 DOA: 06/26/2019 PCP: Patient, No Pcp Per   Brief Narrative:  Patient is a 48 year old female who presented to the emergency department on 7/2 after EMS called out for a stroke symptoms.  Patient was found to be on the floor for 3 hours.  She was reported to have decreased oral intake, diarrhea on and also found to have hypoglycemia.  Work-up in the emergency department showed severe hyponatremia, hyperglycemia, AKI, rhabdomyolysis.  There was concern for ethanol withdrawal.  Patient was admitted under  PCCM service for the management of severe hyponatremia.  Patient handed over to [facility] on 06/29/2019.  Sodium level improving.  Assessment & Plan:   Active Problems:   Hyponatremia  Severe hyponatremia: Found to have sodium level of 108 on presentation.  Etiology could be multifactorial.  Could be associated with beer potomania, decreased oral intake or SIADH.  Urine osmolality greater than 100 .  Normal TSH. Given DDAVP by PCCM.  Currently fluid restricted.  Sodium level is 123 today.  Start on salt tablets.    Acute metabolic encephalopathy: Presented with altered mental status secondary to metabolic encephalopathy secondary to electrolyte disorder.  Currently alert and oriented.  Acute hypoxic respiratory failure: Still on 2 L of oxygen per minute.  Will check follow-up chest x-ray.  Last x-ray showed cardiomegaly and central vascular congestion.  AKI with rhabdomyolysis: Due to severe dehydration, lying on the floor.  AKI has resolved.  Fluids stopped  Chronic alcohol abuse: Drinks beer every day.  Continue thiamine and folic acid.  Watch for withdrawal  Elevated liver enzymes: Liver enzymes improving.  Pattern.  T bili still high.  Will get ultrasound of the abdomen.  Thrombocytopenia: She has severe thrombocytopenia most likely secondary to cirrhosis.  Suspected urinary tract infection.  UA suggestive of urinary tract  infection but urine culture has not been sent.  Patient currently denies any dysuria.  Currently on ceftriaxone.  Will discontinue antibiotic.  Leukocytosis: Most likely secondary to steroids.  Will discontinue Solu-Medrol.  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  Sleep study as an outpatient.        DVT prophylaxis: SCD Code Status: Full code Family Communication: None present at the bedside Disposition Plan: Likely home in 1 to 2 days   Consultants: PCCM  Procedures: None  Antimicrobials:  Anti-infectives (From admission, onward)   Start     Dose/Rate Route Frequency Ordered Stop   06/26/19 1400  cefTRIAXone (ROCEPHIN) 1 g in sodium chloride 0.9 % 100 mL IVPB     1 g 200 mL/hr over 30 Minutes Intravenous Every 24 hours 06/26/19 1119        Subjective:  Patient seen and examined at bedside this morning.  Currently hemodynamically stable.sleepy slightly sleepy, drowsy this morning.  Alert and oriented.  Denies any specific complaints.  Objective: Vitals:   06/29/19 0800 06/29/19 0820 06/29/19 0900 06/29/19 1000  BP: 120/71  (!) 131/91 128/68  Pulse: 77  97 88  Resp: (!) 9  16 12   Temp:  99 F (37.2 C)    TempSrc:  Oral    SpO2: 94%  (!) 87% 92%  Weight:        Intake/Output Summary (Last 24 hours) at 06/29/2019 1129 Last data filed at 06/29/2019 1000 Gross per 24 hour  Intake 850 ml  Output 3450 ml  Net -2600 ml  Filed Weights   06/27/19 0455 06/28/19 0500 06/29/19 0500  Weight: 130.2 kg 129.4 kg 127.7 kg    Examination:  General exam: Not in distress,obese HEENT:PERRL,Oral mucosa moist, Ear/Nose normal on gross exam Respiratory system: Bilateral equal air entry, normal vesicular breath sounds, no wheezes or crackles  Cardiovascular system: S1 & S2 heard, RRR. No JVD, murmurs, rubs, gallops or clicks. No pedal edema. Gastrointestinal system: Abdomen is mildly distended, soft  and nontender. No organomegaly or masses felt. Normal bowel sounds heard. Central nervous system: Alert and oriented. No focal neurological deficits. Extremities: No edema, no clubbing ,no cyanosis, distal peripheral pulses palpable. Skin: Minor skin breakdowns.  Data Reviewed: I have personally reviewed following labs and imaging studies  CBC: Recent Labs  Lab 06/26/19 1120  06/27/19 0432 06/27/19 0446 06/27/19 0828 06/28/19 0634 06/29/19 0826  WBC 29.5*  --   --  23.8*  --  18.1* 14.9*  NEUTROABS  --   --   --   --   --  16.3* 12.8*  HGB 12.4   < > 13.6 12.1 13.6 11.5* 12.0  HCT RESULTS UNAVAILABLE DUE TO INTERFERING SUBSTANCE   < > 40.0 32.4* 40.0 31.8* 34.2*  MCV RESULTS UNAVAILABLE DUE TO INTERFERING SUBSTANCE  --   --  95.9  --  97.5 101.5*  PLT 23*  --   --  22*  --  29* 37*   < > = values in this interval not displayed.   Basic Metabolic Panel: Recent Labs  Lab 06/27/19 1752 06/27/19 2211 06/28/19 0230 06/28/19 0531 06/28/19 1224 06/28/19 1837 06/29/19 0020 06/29/19 0826  NA 114* 116* 119* 119* 121* 119* 120* 123*  K 5.3* 5.4* 5.0 5.0  --   --   --  4.7  CL 86* 88* 89* 92*  --   --   --  92*  CO2 18* 18* 20* 17*  --   --   --  24  GLUCOSE 133* 136* 139* 133*  --   --   --  103*  BUN 51* 48* 45* 41*  --   --   --  30*  CREATININE 1.33* 1.07* 1.12* 0.85  --   --   --  0.74  CALCIUM 7.4* 7.3* 7.6* 7.5*  --   --   --  8.1*  MG  --   --   --  1.7  --   --   --   --   PHOS  --   --   --  3.5  --   --   --   --    GFR: CrCl cannot be calculated (Unknown ideal weight.). Liver Function Tests: Recent Labs  Lab 06/26/19 1120 06/27/19 0446 06/28/19 0531 06/29/19 0826  AST 261* 217* 146* 161*  ALT 53* 55* 47* 74*  ALKPHOS 173* 159* 150* 167*  BILITOT 6.7* 7.2* 6.2* 6.6*  PROT 5.8* 5.9* 5.8* 6.3*  ALBUMIN 2.0* 2.0* 1.9* 2.0*   No results for input(s): LIPASE, AMYLASE in the last 168 hours. Recent Labs  Lab 06/26/19 1120  AMMONIA 28   Coagulation Profile:  Recent Labs  Lab 06/26/19 1120 06/27/19 0446 06/28/19 0634 06/29/19 0826  INR 1.9* 1.8* 1.7* 1.5*   Cardiac Enzymes: Recent Labs  Lab 06/26/19 1120 06/26/19 1805 06/27/19 0446  CKTOTAL 2,140* 1,129* 452*   BNP (last 3 results) No results for input(s): PROBNP in the last 8760 hours. HbA1C: No results for input(s): HGBA1C in the last 72 hours. CBG:  Recent Labs  Lab 06/28/19 1608 06/28/19 2023 06/29/19 0020 06/29/19 0338 06/29/19 0816  GLUCAP 176* 145* 123* 118* 94   Lipid Profile: No results for input(s): CHOL, HDL, LDLCALC, TRIG, CHOLHDL, LDLDIRECT in the last 72 hours. Thyroid Function Tests: No results for input(s): TSH, T4TOTAL, FREET4, T3FREE, THYROIDAB in the last 72 hours. Anemia Panel: No results for input(s): VITAMINB12, FOLATE, FERRITIN, TIBC, IRON, RETICCTPCT in the last 72 hours. Sepsis Labs: No results for input(s): PROCALCITON, LATICACIDVEN in the last 168 hours.  Recent Results (from the past 240 hour(s))  MRSA PCR Screening     Status: Abnormal   Collection Time: 06/26/19  2:44 PM   Specimen: Nasopharyngeal  Result Value Ref Range Status   MRSA by PCR POSITIVE (A) NEGATIVE Final    Comment:        The GeneXpert MRSA Assay (FDA approved for NASAL specimens only), is one component of a comprehensive MRSA colonization surveillance program. It is not intended to diagnose MRSA infection nor to guide or monitor treatment for MRSA infections. RESULT CALLED TO, READ BACK BY AND VERIFIED WITH: Claud KelpE. Curry RN 16:30 06/26/19 ([email or web address]) Performed at [facility], [address] [place], [place] [address]   SARS Coronavirus 2 (CEPHEID - Performed in [facility] hospital lab), Hosp Order     Status: None   Collection Time: 06/29/19  9:39 AM   Specimen: Nasopharyngeal Swab  Result Value Ref Range Status   SARS Coronavirus 2 NEGATIVE NEGATIVE Final    Comment: (NOTE) If result is NEGATIVE SARS-CoV-2 target nucleic acids are NOT DETECTED. The  SARS-CoV-2 RNA is generally detectable in upper and lower  respiratory specimens during the acute phase of infection. The lowest  concentration of SARS-CoV-2 viral copies this assay can detect is 250  copies / mL. A negative result does not preclude SARS-CoV-2 infection  and should not be used as the sole basis for treatment or other  patient management decisions.  A negative result may occur with  improper specimen collection / handling, submission of specimen other  than nasopharyngeal swab, presence of viral mutation(s) within the  areas targeted by this assay, and inadequate number of viral copies  (<250 copies / mL). A negative result must be combined with clinical  observations, patient history, and epidemiological information. If result is POSITIVE SARS-CoV-2 target nucleic acids are DETECTED. The SARS-CoV-2 RNA is generally detectable in upper and lower  respiratory specimens dur ing the acute phase of infection.  Positive  results are indicative of active infection with SARS-CoV-2.  Clinical  correlation with patient history and other diagnostic information is  necessary to determine patient infection status.  Positive results do  not rule out bacterial infection or co-infection with other viruses. If result is PRESUMPTIVE POSTIVE SARS-CoV-2 nucleic acids MAY BE PRESENT.   A presumptive positive result was obtained on the submitted specimen  and confirmed on repeat testing.  While 2019 novel coronavirus  (SARS-CoV-2) nucleic acids may be present in the submitted sample  additional confirmatory testing may be necessary for epidemiological  and / or clinical management purposes  to differentiate between  SARS-CoV-2 and other Sarbecovirus currently known to infect humans.  If clinically indicated additional testing with an alternate test  methodology [phone]([id number]) is advised. The SARS-CoV-2 RNA is generally  detectable in upper and lower respiratory sp ecimens during the acute   phase of infection. The expected result is Negative. Fact Sheet for Patients:  BoilerBrush.com.cyhttps://www.fda.gov/media/136312/download Fact Sheet for Healthcare Providers: https://pope.com/https://www.fda.gov/media/136313/download This  test is not yet approved or cleared by the [place] FDA and has been authorized for detection and/or diagnosis of SARS-CoV-2 by FDA under an Emergency Use Authorization (EUA).  This EUA will remain in effect (meaning this test can be used) for the duration of the COVID-19 declaration under Section 564(b)(1) of the Act, 21 U.S.C. section 360bbb-3(b)(1), unless the authorization is terminated or revoked sooner. Performed at [facility], [address] [place], [place] [address]          Radiology Studies: Dg Chest Port 1 View  Result Date: 06/28/2019 CLINICAL DATA:  Respiratory failure. EXAM: PORTABLE CHEST 1 VIEW COMPARISON:  Radiograph June 26, 2019. FINDINGS: Stable cardiomegaly and central pulmonary vascular congestion. No pneumothorax or pleural effusion is noted. No consolidative process is noted. Bony thorax is unremarkable. IMPRESSION: Stable cardiomegaly and central pulmonary vascular congestion. Electronically Signed   By: Lupita RaiderJames  Green Jr M.D.   On: 06/28/2019 08:40   Dg Shoulder Left Port  Result Date: 06/28/2019 CLINICAL DATA:  Acute onset of left shoulder pain. EXAM: LEFT SHOULDER - 1 VIEW COMPARISON:  01/28/2017 FINDINGS: Chronic osteoarthritis of the left shoulder with osteophyte formation. No evidence of fracture or dislocation. Degenerative change affects the [facility] joint. 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 Electronically Signed   By: Paulina FusiMark  Shogry M.D.   On: 06/28/2019 10:55        Scheduled Meds: . Chlorhexidine Gluconate Cloth  6 each Topical Q0600  . folic acid  1 mg Intravenous Daily  . insulin aspart  0-24 Units Subcutaneous Q4H  . methylPREDNISolone (SOLU-MEDROL) injection  40 mg Intravenous  Daily  . mupirocin ointment  1 application Nasal BID  . pantoprazole (PROTONIX) IV  40 mg Intravenous QHS  . sodium chloride  1 g Oral TID WC  . thiamine injection  100 mg Intravenous Daily   Continuous Infusions: . cefTRIAXone (ROCEPHIN)  IV 1 g (06/28/19 1351)     LOS: 3 days    Time spent: 35 mins.More than 50% of that time was spent in counseling and/or coordination of care.      Burnadette PopAmrit Kirke Breach, MD [facility] Pager [phone]  If 7PM-7AM, please contact night-coverage [email or web address] Password [email or web address] 06/29/2019, 11:29 AM
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
## 2019-06-30 ENCOUNTER — Encounter (HOSPITAL_COMMUNITY): Payer: Self-pay

## 2019-06-30 ENCOUNTER — Other Ambulatory Visit: Payer: Self-pay

## 2019-06-30 LAB — CBC
HCT: 35.6 % — ABNORMAL LOW (ref 36.0–46.0)
Hemoglobin: 12.3 g/dL (ref 12.0–15.0)
MCH: 35.3 pg — ABNORMAL HIGH (ref 26.0–34.0)
MCHC: 34.6 g/dL (ref 30.0–36.0)
MCV: 102.3 fL — ABNORMAL HIGH (ref 80.0–100.0)
Platelets: 48 10*3/uL — ABNORMAL LOW (ref 150–400)
RBC: 3.48 MIL/uL — ABNORMAL LOW (ref 3.87–5.11)
RDW: 16 % — ABNORMAL HIGH (ref 11.5–15.5)
WBC: 13.8 10*3/uL — ABNORMAL HIGH (ref 4.0–10.5)
nRBC: 0 % (ref 0.0–0.2)

## 2019-06-30 LAB — BASIC METABOLIC PANEL
Anion gap: 8 (ref 5–15)
BUN: 20 mg/dL (ref 6–20)
CO2: 26 mmol/L (ref 22–32)
Calcium: 8.4 mg/dL — ABNORMAL LOW (ref 8.9–10.3)
Chloride: 96 mmol/L — ABNORMAL LOW (ref 98–111)
Creatinine, Ser: 0.64 mg/dL (ref 0.44–1.00)
GFR calc Af Amer: >60 mL/min (ref >60)
GFR calc non Af Amer: >60 mL/min (ref >60)
Glucose, Bld: 112 mg/dL — ABNORMAL HIGH (ref 70–99)
Potassium: 4.8 mmol/L (ref 3.5–5.1)
Sodium: 130 mmol/L — ABNORMAL LOW (ref 135–145)

## 2019-06-30 LAB — GLUCOSE, CAPILLARY
Glucose-Capillary: 83 mg/dL (ref 70–99)
Glucose-Capillary: 89 mg/dL (ref 70–99)
Glucose-Capillary: 111 mg/dL — ABNORMAL HIGH (ref 70–99)
Glucose-Capillary: 98 mg/dL (ref 70–99)
Glucose-Capillary: 110 mg/dL — ABNORMAL HIGH (ref 70–99)

## 2019-06-30 LAB — HEPATIC FUNCTION PANEL
ALT: 83 U/L — ABNORMAL HIGH (ref 0–44)
AST: 151 U/L — ABNORMAL HIGH (ref 15–41)
Albumin: 2 g/dL — ABNORMAL LOW (ref 3.5–5.0)
Alkaline Phosphatase: 174 U/L — ABNORMAL HIGH (ref 38–126)
Bilirubin, Direct: 3.1 mg/dL — ABNORMAL HIGH (ref 0.0–0.2)
Indirect Bilirubin: 3.8 mg/dL — ABNORMAL HIGH (ref 0.3–0.9)
Total Bilirubin: 6.9 mg/dL — ABNORMAL HIGH (ref 0.3–1.2)
Total Protein: 6.1 g/dL — ABNORMAL LOW (ref 6.5–8.1)

## 2019-06-30 MED ORDER — LISINOPRIL 20 MG PO TABS
20.0000 mg | ORAL_TABLET | Freq: Every day | ORAL | Status: DC
  Administered 2019-06-30 – 2019-07-04 (×5): 20 mg via ORAL
  Filled 2019-06-30 (×7): qty 1

## 2019-06-30 MED ORDER — CITALOPRAM HYDROBROMIDE 20 MG PO TABS
40.0000 mg | ORAL_TABLET | Freq: Every day | ORAL | Status: DC
  Administered 2019-06-30 – 2019-07-02 (×3): 40 mg via ORAL
  Filled 2019-06-30 (×4): qty 2

## 2019-06-30 MED ORDER — ONDANSETRON HCL 4 MG/2ML IJ SOLN
4.0000 mg | Freq: Four times a day (QID) | INTRAMUSCULAR | Status: DC | PRN
  Administered 2019-06-30 – 2019-07-03 (×9): 4 mg via INTRAVENOUS
  Filled 2019-06-30 (×9): qty 2

## 2019-06-30 MED ORDER — ALPRAZOLAM 0.5 MG PO TABS: 0.5000 mg | ORAL_TABLET | Freq: Two times a day (BID) | ORAL | Status: DC | PRN

## 2019-06-30 MED ORDER — TRAZODONE HCL 50 MG PO TABS: 100.0000 mg | ORAL_TABLET | Freq: Every evening | ORAL | Status: DC | PRN

## 2019-06-30 NOTE — Progress Notes (Signed)
Physical Therapy Treatment Patient Details Name: Tiffany Howell MRN: [id number] DOB: 30-Dec-1970 Today's Date: 06/30/2019    History of Present Illness 48 y/o F who [facility] after EMS called out for stroke symptoms.  Pt had reportedly been on the floor x3 hours, decreased PO intake, diarrhea, hypoglycemia.  Work up notable for severe hyponatremia, hypoglycemia, AKI / rhabdomyolysis.  Additional concern for ETOH withdrawal.     PT Comments    Pt admitted with above diagnosis. Pt currently with functional limitations due to balance and endurance deficits. Pt was able to ambulate in hallway with RW with min guard assist and cues for sequencing steps and RW.  Pt fearful of falling therefore followed with chair and she did need to sit to rest due to back pain and bil knee instability.  Will follow acutely.  Pt will benefit from skilled PT to increase their independence and safety with mobility to allow discharge to the venue listed below.     Follow Up Recommendations  Home health PT;Supervision/Assistance - 24 hour     Equipment Recommendations  Other (comment)(pt states she has a RW and 3N1)    Recommendations for Other Services OT consult     Precautions / Restrictions Precautions Precautions: Fall Restrictions Weight Bearing Restrictions: No    Mobility  Bed Mobility               General bed mobility comments: Pt up in chair on arrival.  Transfers Overall transfer level: Needs assistance Equipment used: Rolling walker (2 wheeled) Transfers: Sit to/from Stand Sit to Stand: Min assist;+2 safety/equipment         General transfer comment: min steadying A to  RW  Ambulation/Gait Ambulation/Gait assistance: Min assist;+2 safety/equipment Gait Distance (Feet): 70 Feet Assistive device: Rolling walker (2 wheeled) Gait Pattern/deviations: Step-to pattern;Decreased step length - right;Decreased step length - left;Shuffle;Antalgic;Trunk flexed;Wide base of  support;Drifts right/left Gait velocity: slowed Gait velocity interpretation: <1.31 ft/sec, indicative of household ambulator General Gait Details: minA for steadying with ability to progress ambulation in hallway with RW.  Bil Knee instability noted but pt was able to progress ambulation distance. Pt needed sitting rest break in chair (followed with chair).  See OT note for pt standing at sink.    Stairs             Wheelchair Mobility    Modified Rankin (Stroke Patients Only)       Balance Overall balance assessment: Needs assistance Sitting-balance support: Feet supported;No upper extremity supported;Feet unsupported Sitting balance-Leahy Scale: Fair     Standing balance support: Bilateral upper extremity supported;During functional activity Standing balance-Leahy Scale: Poor Standing balance comment: requires bil UE support for balance                            Cognition Arousal/Alertness: Awake/alert Behavior During Therapy: Flat affect Overall Cognitive Status: Impaired/Different from baseline Area of Impairment: Attention;Memory;Following commands;Awareness;Problem solving                   Current Attention Level: Selective Memory: Decreased short-term memory(can't remember events leading up to hospitalization ) Following Commands: Follows one step commands with increased time;Follows multi-step commands with increased time   Awareness: Emergent Problem Solving: Slow processing;Decreased initiation;Difficulty sequencing;Requires verbal cues;Requires tactile cues General Comments: Does not remember what happened before she came into the hospital, is very slow in processing commands and requires increased time and multimodal cues for command  follow      Exercises      General Comments General comments (skin integrity, edema, etc.): HR to 132 bpm      Pertinent Vitals/Pain Pain Assessment: Faces Pain Score: 4  Faces Pain Scale: Hurts  even more Pain Location: L shoulder, back, knees  Pain Descriptors / Indicators: Grimacing;Guarding Pain Intervention(s): Limited activity within patient's tolerance;Monitored during session;Repositioned    Home Living Family/patient expects to be discharged to:: Private residence Living Arrangements: Non-relatives/Friends(roomates ) Available Help at Discharge: Available PRN/intermittently;Friend(s) Type of Home: House Home Access: Ramped entrance   Home Layout: One level Home Equipment: Walker - 2 wheels;Grab bars - toilet;Grab bars - tub/shower;Bedside commode      Prior Function Level of Independence: Independent      Comments: independent IADLs, ADLs, drives    PT Goals (current goals can now be found in the care plan section) Acute Rehab PT Goals Patient Stated Goal: remember what happened Progress towards PT goals: Progressing toward goals    Frequency    Min 3X/week      PT Plan Current plan remains appropriate    Co-evaluation PT/OT/SLP Co-Evaluation/Treatment: Yes Reason for Co-Treatment: Complexity of the patient's impairments (multi-system involvement);For patient/therapist safety PT goals addressed during session: Mobility/safety with mobility        AM-PAC PT "6 Clicks" Mobility   Outcome Measure  Help needed turning from your back to your side while in a flat bed without using bedrails?: None Help needed moving from lying on your back to sitting on the side of a flat bed without using bedrails?: None Help needed moving to and from a bed to a chair (including a wheelchair)?: A Little Help needed standing up from a chair using your arms (e.g., wheelchair or bedside chair)?: A Little Help needed to walk in hospital room?: A Little Help needed climbing 3-5 steps with a railing? : A Lot 6 Click Score: 19    End of Session Equipment Utilized During Treatment: Gait belt Activity Tolerance: Patient tolerated treatment well Patient left: with call  bell/phone within reach;in chair Nurse Communication: Mobility status PT Visit Diagnosis: Unsteadiness on feet (R26.81);Other abnormalities of gait and mobility (R26.89);Muscle weakness (generalized) (M62.81);History of falling (Z91.81);Difficulty in walking, not elsewhere classified (R26.2);Pain Pain - Right/Left: Left Pain - part of body: Shoulder(back and bilateral knees)     Time: 1610-9604 PT Time Calculation (min) (ACUTE ONLY): 22 min  Charges:  $Gait Training: 8-22 mins                     [place] Pager:  [phone]  Office:  [place] 06/30/2019, 10:24 AM

## 2019-06-30 NOTE — Progress Notes (Signed)
Spoke with patient about wearing CPAP tonight.  Patient was having some nausea, RN getting med for patient.  Told RN and patient to give me a call if nausea gets better and will put patient on.

## 2019-06-30 NOTE — Progress Notes (Signed)
PROGRESS NOTE    Tiffany Howell  ZOX:[id number] DOB: Jan 15, 1971 DOA: 06/26/2019 PCP: Patient, No Pcp Per   Brief Narrative:  Patient is a 48 year old female who presented to the emergency department on 7/2 after EMS called out for a stroke symptoms.  Patient was found to be on the floor for 3 hours.  She was reported to have decreased oral intake, diarrhea on and also found to have hypoglycemia.  Work-up in the emergency department showed severe hyponatremia, hyperglycemia, AKI, rhabdomyolysis.  There was concern for ethanol withdrawal.  Patient was admitted under  PCCM service for the management of severe hyponatremia.  [facility].  Sodium level improving.  Assessment & Plan:   Active Problems:   Hyponatremia  Severe hyponatremia: Found to have sodium level of 108 on presentation.  Etiology could be multifactorial.  Could be associated with beer potomania, decreased oral intake or SIADH.  Urine osmolality greater than 100 .  Normal TSH. Given DDAVP by PCCM.  Currently fluid restricted.  Sodium level is 130 today.  Currently  on salt tablets.    Acute metabolic encephalopathy: Presented with altered mental status secondary to metabolic encephalopathy secondary to electrolyte disorder.  Currently alert and oriented.  Acute hypoxic respiratory failure: Last chest x-ray showed stable cardiomegaly with vascular congestion.  No interval change.  Currently on room air.  AKI with rhabdomyolysis: Due to severe dehydration, lying on the floor.  AKI has resolved.  Fluids stopped  Chronic alcohol abuse: Drinks beer every day.  Continue thiamine and folic acid.  Watch for withdrawal  Elevated liver enzymes: Liver enzymes improving.  Alcoholic Pattern.  T bili still high.  US abdomen showed hepatic cirrhosis  Thrombocytopenia: She has severe thrombocytopenia most likely secondary to cirrhosis.Improving  Suspected urinary tract infection.  UA suggestive of urinary tract  infection but urine culture has not been sent.  Patient currently denies any dysuria. Abx discotinued  Leukocytosis: Most likely secondary to steroids.  Will discontinue Solu-Medrol.  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  Suspected OSA: CPAP at night.  Sleep study as an outpatient.        DVT prophylaxis: SCD Code Status: Full code Family Communication: None present at the bedside Disposition Plan: Likely home in 1 to 2 days   Consultants: PCCM  Procedures: None  Antimicrobials:  Anti-infectives (From admission, onward)   Start     Dose/Rate Route Frequency Ordered Stop   06/26/19 1400  cefTRIAXone (ROCEPHIN) 1 g in sodium chloride 0.9 % 100 mL IVPB  Status:  Discontinued     1 g 200 mL/hr over 30 Minutes Intravenous Every 24 hours 06/26/19 1119 06/29/19 1141      Subjective:  Patient seen and examined the bedside this morning.  Hemodynamically stable.  Off oxygen.  More alert on awake today.  Lying on the bed.  Encouraged for ambulation.  She did well with the physical therapy earlier and she has been recommended home health.  Likely discharge tomorrow  Objective: Vitals:   06/29/19 2011 06/30/19 0316 06/30/19 0732 06/30/19 1036  BP: (!) 141/85 (!) 169/88 (!) 156/80 (!) 153/83  Pulse: 92 94 94   Resp: 17 17 13 13   Temp: 98.2 F (36.8 C) 97.7 F (36.5 C) 98.6 F (37 C) 98.2 F (36.8 C)  TempSrc: Oral Oral Oral Oral  SpO2: 96% 97% 95%   Weight:  Intake/Output Summary (Last 24 hours) at 06/30/2019 1109 Last data filed at 06/30/2019 0653 Gross per 24 hour  Intake 1182 ml  Output 3350 ml  Net -2168 ml   Filed Weights   06/27/19 0455 06/28/19 0500 06/29/19 0500  Weight: 130.2 kg 129.4 kg 127.7 kg    Examination:   General exam: Not in distress,obese HEENT:PERRL,Oral mucosa moist, Ear/Nose normal on gross exam Respiratory system: Bilateral equal air entry,  normal vesicular breath sounds, no wheezes or crackles  Cardiovascular system: S1 & S2 heard, RRR. No JVD, murmurs, rubs, gallops or clicks.No pedal edema Gastrointestinal system: Abdomen is nondistended, soft and nontender. No organomegaly or masses felt. Normal bowel sounds heard. Central nervous system: Alert and oriented. No focal neurological deficits. Extremities: No edema, no clubbing ,no cyanosis, distal peripheral pulses palpable. Skin: Minor skin breakdowns   Data Reviewed: I have personally reviewed following labs and imaging studies  CBC: Recent Labs  Lab 06/26/19 1120  06/27/19 0446 06/27/19 0828 06/28/19 0634 06/29/19 0826 06/30/19 0755  WBC 29.5*  --  23.8*  --  18.1* 14.9* 13.8*  NEUTROABS  --   --   --   --  16.3* 12.8*  --   HGB 12.4   < > 12.1 13.6 11.5* 12.0 12.3  HCT RESULTS UNAVAILABLE DUE TO INTERFERING SUBSTANCE   < > 32.4* 40.0 31.8* 34.2* 35.6*  MCV RESULTS UNAVAILABLE DUE TO INTERFERING SUBSTANCE  --  95.9  --  97.5 101.5* 102.3*  PLT 23*  --  22*  --  29* 37* 48*   < > = values in this interval not displayed.   Basic Metabolic Panel: Recent Labs  Lab 06/27/19 2211 06/28/19 0230 06/28/19 0531 06/28/19 1224 06/28/19 1837 06/29/19 0020 06/29/19 0826 06/30/19 0755  NA 116* 119* 119* 121* 119* 120* 123* 130*  K 5.4* 5.0 5.0  --   --   --  4.7 4.8  CL 88* 89* 92*  --   --   --  92* 96*  CO2 18* 20* 17*  --   --   --  24 26  GLUCOSE 136* 139* 133*  --   --   --  103* 112*  BUN 48* 45* 41*  --   --   --  30* 20  CREATININE 1.07* 1.12* 0.85  --   --   --  0.74 0.64  CALCIUM 7.3* 7.6* 7.5*  --   --   --  8.1* 8.4*  MG  --   --  1.7  --   --   --   --   --   PHOS  --   --  3.5  --   --   --   --   --    GFR: CrCl cannot be calculated (Unknown ideal weight.). Liver Function Tests: Recent Labs  Lab 06/26/19 1120 06/27/19 0446 06/28/19 0531 06/29/19 0826 06/30/19 0755  AST 261* 217* 146* 161* 151*  ALT 53* 55* 47* 74* 83*  ALKPHOS 173* 159*  150* 167* 174*  BILITOT 6.7* 7.2* 6.2* 6.6* 6.9*  PROT 5.8* 5.9* 5.8* 6.3* 6.1*  ALBUMIN 2.0* 2.0* 1.9* 2.0* 2.0*   No results for input(s): LIPASE, AMYLASE in the last 168 hours. Recent Labs  Lab 06/26/19 1120  AMMONIA 28   Coagulation Profile: Recent Labs  Lab 06/26/19 1120 06/27/19 0446 06/28/19 0634 06/29/19 0826  INR 1.9* 1.8* 1.7* 1.5*   Cardiac Enzymes: Recent Labs  Lab 06/26/19 1120 06/26/19 1805 06/27/19  0446  CKTOTAL 2,140* 1,129* 452*   BNP (last 3 results) No results for input(s): PROBNP in the last 8760 hours. HbA1C: No results for input(s): HGBA1C in the last 72 hours. CBG: Recent Labs  Lab 06/29/19 1156 06/29/19 1623 06/29/19 2009 06/29/19 2341 06/30/19 0433  GLUCAP 115* 138* 146* 128* 83   Lipid Profile: No results for input(s): CHOL, HDL, LDLCALC, TRIG, CHOLHDL, LDLDIRECT in the last 72 hours. Thyroid Function Tests: No results for input(s): TSH, T4TOTAL, FREET4, T3FREE, THYROIDAB in the last 72 hours. Anemia Panel: No results for input(s): VITAMINB12, FOLATE, FERRITIN, TIBC, IRON, RETICCTPCT in the last 72 hours. Sepsis Labs: No results for input(s): PROCALCITON, LATICACIDVEN in the last 168 hours.  Recent Results (from the past 240 hour(s))  MRSA PCR Screening     Status: Abnormal   Collection Time: 06/26/19  2:44 PM   Specimen: Nasopharyngeal  Result Value Ref Range Status   MRSA by PCR POSITIVE (A) NEGATIVE Final    Comment:        The GeneXpert MRSA Assay (FDA approved for NASAL specimens only), is one component of a comprehensive MRSA colonization surveillance program. It is not intended to diagnose MRSA infection nor to guide or monitor treatment for MRSA infections. RESULT CALLED TO, READ BACK BY AND VERIFIED WITH: Claud KelpE. Curry RN 16:30 06/26/19 ([email or web address]) Performed at [facility], [address] [place], [place] [address]   SARS Coronavirus 2 (CEPHEID - Performed in [facility] lab), Hosp Order     Status: None    Collection Time: 06/29/19  9:39 AM   Specimen: Nasopharyngeal Swab  Result Value Ref Range Status   SARS Coronavirus 2 NEGATIVE NEGATIVE Final    Comment: (NOTE) If result is NEGATIVE SARS-CoV-2 target nucleic acids are NOT DETECTED. The SARS-CoV-2 RNA is generally detectable in upper and lower  respiratory specimens during the acute phase of infection. The lowest  concentration of SARS-CoV-2 viral copies this assay can detect is 250  copies / mL. A negative result does not preclude SARS-CoV-2 infection  and should not be used as the sole basis for treatment or other  patient management decisions.  A negative result may occur with  improper specimen collection / handling, submission of specimen other  than nasopharyngeal swab, presence of viral mutation(s) within the  areas targeted by this assay, and inadequate number of viral copies  (<250 copies / mL). A negative result must be combined with clinical  observations, patient history, and epidemiological information. If result is POSITIVE SARS-CoV-2 target nucleic acids are DETECTED. The SARS-CoV-2 RNA is generally detectable in upper and lower  respiratory specimens dur ing the acute phase of infection.  Positive  results are indicative of active infection with SARS-CoV-2.  Clinical  correlation with patient history and other diagnostic information is  necessary to determine patient infection status.  Positive results do  not rule out bacterial infection or co-infection with other viruses. If result is PRESUMPTIVE POSTIVE SARS-CoV-2 nucleic acids MAY BE PRESENT.   A presumptive positive result was obtained on the submitted specimen  and confirmed on repeat testing.  While 2019 novel coronavirus  (SARS-CoV-2) nucleic acids may be present in the submitted sample  additional confirmatory testing may be necessary for epidemiological  and / or clinical management purposes  to differentiate between  SARS-CoV-2 and other Sarbecovirus  currently known to infect humans.  If clinically indicated additional testing with an alternate test  methodology [phone]([id number]) is advised. The SARS-CoV-2 RNA is generally  detectable in upper and lower respiratory sp ecimens during the acute  phase of infection. The expected result is Negative. Fact Sheet for Patients:  StrictlyIdeas.no Fact Sheet for Healthcare Providers: BankingDealers.co.za This test is not yet approved or cleared by the [place] FDA and has been authorized for detection and/or diagnosis of SARS-CoV-2 by FDA under an Emergency Use Authorization (EUA).  This EUA will remain in effect (meaning this test can be used) for the duration of the COVID-19 declaration under Section 564(b)(1) of the Act, 21 U.S.C. section 360bbb-3(b)(1), unless the authorization is terminated or revoked sooner. Performed at [facility], [place] [address] [place], [place] [address]          Radiology Studies: Dg Chest 1 View  Result Date: 06/29/2019 CLINICAL DATA:  Hyponatremia, shortness of breath EXAM: CHEST  1 VIEW COMPARISON:  06/26/2019, 06/28/2019 FINDINGS: Stable cardiomegaly with vascular congestion centrally. No focal pneumonia, collapse or consolidation. Negative for effusion or pneumothorax. Trachea midline. No acute osseous finding. IMPRESSION: Stable cardiomegaly with vascular congestion.  No interval change. Electronically Signed   By: Jerilynn Mages.  Shick M.D.   On: 06/29/2019 13:18   US Abdomen Limited Ruq  Result Date: 06/29/2019 CLINICAL DATA:  Cirrhosis. EXAM: ULTRASOUND ABDOMEN LIMITED RIGHT UPPER QUADRANT COMPARISON:  CT 06/24/2017 FINDINGS: Gallbladder: Physiologically distended. No gallstones or wall thickening visualized. No sonographic Murphy sign noted by sonographer. Common bile duct: Diameter: 2 mm, normal. Liver: Heterogeneous increased hepatic parenchyma with nodular contours consistent with cirrhosis. No obvious focal  lesion. Portal vein is patent on color Doppler imaging with normal direction of blood flow towards the liver. No right upper quadrant ascites. Technically difficult exam due to patient body habitus. IMPRESSION: 1. Hepatic cirrhosis. No focal liver lesion, however technically limited exam due to habitus. No right upper quadrant ascites. 2. No gallstones or biliary dilatation. Electronically Signed   By: Keith Rake M.D.   On: 06/29/2019 19:38        Scheduled Meds: . Chlorhexidine Gluconate Cloth  6 each Topical Q0600  . citalopram  40 mg Oral Daily  . folic acid  1 mg Oral Daily  . insulin aspart  0-24 Units Subcutaneous Q4H  . lisinopril  20 mg Oral Daily  . mupirocin ointment  1 application Nasal BID  . sodium chloride  1 g Oral TID WC  . thiamine  100 mg Oral Daily   Continuous Infusions:    LOS: 4 days    Time spent: 35 mins.More than 50% of that time was spent in counseling and/or coordination of care.      Shelly Coss, MD [facility] Pager [phone]  If 7PM-7AM, please contact night-coverage [email or web address] Password [email or web address] 06/30/2019, 11:09 AM

## 2019-06-30 NOTE — Progress Notes (Signed)
Pt very confused tonight. Does not tol. CPAP.

## 2019-06-30 NOTE — Progress Notes (Signed)
Spoke with patient again about CPAP usage.  She stated that she still felt nauseous.  I again informed RN to let me know if patient asks for it.  No distress noted at this time.  Will continue to monitor.

## 2019-06-30 NOTE — Evaluation (Signed)
Occupational Therapy Evaluation Patient Details Name: Tiffany BoltStephanie C Howell MRN: [id number] DOB: 07-04-1971 Today's Date: 06/30/2019    History of Present Illness 48 y/o F who [facility] after EMS called out for stroke symptoms.  Pt had reportedly been on the floor x3 hours, decreased PO intake, diarrhea, hypoglycemia.  Work up notable for severe hyponatremia, hypoglycemia, AKI / rhabdomyolysis.  Additional concern for ETOH withdrawal.    Clinical Impression   PTA patient reports independent with ADLs, IADls and driving.  Admitted for above and limited by problem list below, including L shoulder pain, impaired balance, decreased activity tolerance, body habitus, and impaired cognition.  Pt oriented x 4, but presents with decreased STM, impaired problem solving and slow processing--recommend further cognitive assessment.  Patient currently requires min assist for transfers, min assist for grooming at sink standing, mod assist for LB ADls and min assist for UB ADLs.  She will benefit from continued OT services while admitted and [facility] in order to maximize independence and safety and ADLs/mobility.      Follow Up Recommendations  Home health OT;Supervision/Assistance - 24 hour    Equipment Recommendations  3 in 1 bedside commode    Recommendations for Other Services       Precautions / Restrictions Precautions Precautions: Fall Restrictions Weight Bearing Restrictions: No      Mobility Bed Mobility               General bed mobility comments: Pt up in chair on arrival.  Transfers Overall transfer level: Needs assistance Equipment used: Rolling walker (2 wheeled) Transfers: Sit to/from Stand Sit to Stand: Min assist;+2 safety/equipment         General transfer comment: min steadying A to  RW    Balance Overall balance assessment: Needs assistance Sitting-balance support: Feet supported;No upper extremity supported;Feet unsupported Sitting  balance-Leahy Scale: Fair     Standing balance support: Bilateral upper extremity supported;During functional activity;No upper extremity supported Standing balance-Leahy Scale: Poor Standing balance comment: requires bil UE support for balance, able to complete grooming with 0-1 hand support but preference to leaning on sink                           ADL either performed or assessed with clinical judgement   ADL Overall ADL's : Needs assistance/impaired     Grooming: Min guard;Standing;Wash/dry hands;Wash/dry face   Upper Body Bathing: Minimal assistance;Sitting   Lower Body Bathing: Moderate assistance;Sit to/from stand Lower Body Bathing Details (indicate cue type and reason): min assist sit<>stand, decreased reach to B feet Upper Body Dressing : Minimal assistance;Sitting Upper Body Dressing Details (indicate cue type and reason): d/t L shoulder pain Lower Body Dressing: Moderate assistance;Sit to/from stand Lower Body Dressing Details (indicate cue type and reason): min assist sit<>stand, decreased reach to B feet Toilet Transfer: Minimal assistance;+2 for safety/equipment;Ambulation;RW Toilet Transfer Details (indicate cue type and reason): simulated to recliner          Functional mobility during ADLs: Minimal assistance;Rolling walker;+2 for safety/equipment General ADL Comments: pt limited by pain, impaired balance and decreased act tolerance     Vision         Perception     Praxis      Pertinent Vitals/Pain Pain Assessment: Faces Pain Score: 4  Faces Pain Scale: Hurts even more Pain Location: L shoulder, back, knees  Pain Descriptors / Indicators: Grimacing;Guarding Pain Intervention(s): Monitored during session;Repositioned;Limited activity  within patient's tolerance     Hand Dominance Right   Extremity/Trunk Assessment Upper Extremity Assessment Upper Extremity Assessment: LUE deficits/detail;Generalized weakness LUE Deficits / Details:  imaging reveals shoulder OA, pt unable to flex/ab shoulder past 90 degrees LUE: Unable to fully assess due to pain LUE Sensation: WNL LUE Coordination: decreased gross motor   Lower Extremity Assessment Lower Extremity Assessment: Defer to PT evaluation       Communication Communication Communication: No difficulties   Cognition Arousal/Alertness: Awake/alert Behavior During Therapy: Flat affect Overall Cognitive Status: Impaired/Different from baseline Area of Impairment: Attention;Memory;Following commands;Awareness;Problem solving                   Current Attention Level: Selective Memory: Decreased short-term memory Following Commands: Follows one step commands with increased time;Follows multi-step commands with increased time   Awareness: Emergent Problem Solving: Slow processing;Decreased initiation;Difficulty sequencing;Requires verbal cues;Requires tactile cues General Comments: slow processing, requiring increased time and multimodal cueing; pt unable to recall fall leading tgo admission    General Comments  HR up to 132 BPM    Exercises     Shoulder Instructions      Home Living Family/patient expects to be discharged to:: Private residence Living Arrangements: Non-relatives/Friends(roomates) Available Help at Discharge: Available PRN/intermittently;Friend(s) Type of Home: House Home Access: Ramped entrance     Home Layout: One level     Bathroom Shower/Tub: Producer, television/film/videoWalk-in shower   Bathroom Toilet: Standard     Home Equipment: Environmental consultantWalker - 2 wheels;Grab bars - toilet;Grab bars - tub/shower;Bedside commode          Prior Functioning/Environment Level of Independence: Independent        Comments: independent IADLs, ADLs, drives         OT Problem List: Decreased strength;Decreased activity tolerance;Decreased range of motion;Decreased coordination;Decreased cognition;Impaired balance (sitting and/or standing);Decreased knowledge of use of DME or  AE;Pain;Impaired UE functional use      OT Treatment/Interventions: Self-care/ADL training;Balance training;DME and/or AE instruction;Therapeutic exercise;Cognitive remediation/compensation;Therapeutic activities    OT Goals(Current goals can be found in the care plan section) Acute Rehab OT Goals Patient Stated Goal: remember what happened OT Goal Formulation: With patient Time For Goal Achievement: 07/14/19 Potential to Achieve Goals: Good  OT Frequency: Min 2X/week   Barriers to D/C:            Co-evaluation PT/OT/SLP Co-Evaluation/Treatment: Yes Reason for Co-Treatment: Complexity of the patient's impairments (multi-system involvement);For patient/therapist safety PT goals addressed during session: Mobility/safety with mobility OT goals addressed during session: ADL's and self-care      AM-PAC OT "6 Clicks" Daily Activity     Outcome Measure Help from another person eating meals?: None Help from another person taking care of personal grooming?: A Little Help from another person toileting, which includes using toliet, bedpan, or urinal?: A Lot Help from another person bathing (including washing, rinsing, drying)?: A Lot Help from another person to put on and taking off regular upper body clothing?: A Little Help from another person to put on and taking off regular lower body clothing?: A Lot 6 Click Score: 16   End of Session Equipment Utilized During Treatment: Gait belt;Rolling walker Nurse Communication: Mobility status  Activity Tolerance: Patient tolerated treatment well Patient left: in chair;with call bell/phone within reach;with chair alarm set  OT Visit Diagnosis: Other abnormalities of gait and mobility (R26.89);Muscle weakness (generalized) (M62.81);Pain;Other symptoms and signs involving cognitive function Pain - Right/Left: Left Pain - part of body: Shoulder;Knee(back)  Time: 1423-9532 OT Time Calculation (min): 22 min Charges:  OT General  Charges $OT Visit: 1 Visit OT Evaluation $OT Eval Moderate Complexity: [place], [place] Acute Rehabilitation Services Pager [phone] Office [phone]    Delight Stare 06/30/2019, 12:34 PM

## 2019-06-30 NOTE — Plan of Care (Signed)

## 2019-07-01 LAB — BASIC METABOLIC PANEL
Anion gap: 8 (ref 5–15)
Anion gap: 6 (ref 5–15)
BUN: 14 mg/dL (ref 6–20)
BUN: 13 mg/dL (ref 6–20)
CO2: 26 mmol/L (ref 22–32)
CO2: 28 mmol/L (ref 22–32)
Calcium: 8.4 mg/dL — ABNORMAL LOW (ref 8.9–10.3)
Calcium: 8.3 mg/dL — ABNORMAL LOW (ref 8.9–10.3)
Chloride: 95 mmol/L — ABNORMAL LOW (ref 98–111)
Chloride: 97 mmol/L — ABNORMAL LOW (ref 98–111)
Creatinine, Ser: 0.49 mg/dL (ref 0.44–1.00)
Creatinine, Ser: 0.46 mg/dL (ref 0.44–1.00)
GFR calc Af Amer: >60 mL/min (ref >60)
GFR calc Af Amer: >60 mL/min (ref >60)
GFR calc non Af Amer: >60 mL/min (ref >60)
GFR calc non Af Amer: >60 mL/min (ref >60)
Glucose, Bld: 111 mg/dL — ABNORMAL HIGH (ref 70–99)
Glucose, Bld: 115 mg/dL — ABNORMAL HIGH (ref 70–99)
Potassium: 5 mmol/L (ref 3.5–5.1)
Potassium: 5 mmol/L (ref 3.5–5.1)
Sodium: 129 mmol/L — ABNORMAL LOW (ref 135–145)
Sodium: 131 mmol/L — ABNORMAL LOW (ref 135–145)

## 2019-07-01 LAB — GLUCOSE, CAPILLARY
Glucose-Capillary: 93 mg/dL (ref 70–99)
Glucose-Capillary: 85 mg/dL (ref 70–99)
Glucose-Capillary: 119 mg/dL — ABNORMAL HIGH (ref 70–99)
Glucose-Capillary: 100 mg/dL — ABNORMAL HIGH (ref 70–99)
Glucose-Capillary: 184 mg/dL — ABNORMAL HIGH (ref 70–99)
Glucose-Capillary: 139 mg/dL — ABNORMAL HIGH (ref 70–99)

## 2019-07-01 LAB — CBC WITH DIFFERENTIAL/PLATELET
Abs Immature Granulocytes: 0.3 10*3/uL — ABNORMAL HIGH (ref 0.00–0.07)
Basophils Absolute: 0 10*3/uL (ref 0.0–0.1)
Basophils Relative: 0 %
Eosinophils Absolute: 0.2 10*3/uL (ref 0.0–0.5)
Eosinophils Relative: 1 %
HCT: 35.3 % — ABNORMAL LOW (ref 36.0–46.0)
Hemoglobin: 12.2 g/dL (ref 12.0–15.0)
Immature Granulocytes: 2 %
Lymphocytes Relative: 4 %
Lymphs Abs: 0.7 10*3/uL (ref 0.7–4.0)
MCH: 35.7 pg — ABNORMAL HIGH (ref 26.0–34.0)
MCHC: 34.6 g/dL (ref 30.0–36.0)
MCV: 103.2 fL — ABNORMAL HIGH (ref 80.0–100.0)
Monocytes Absolute: 1.3 10*3/uL — ABNORMAL HIGH (ref 0.1–1.0)
Monocytes Relative: 8 %
Neutro Abs: 13.9 10*3/uL — ABNORMAL HIGH (ref 1.7–7.7)
Neutrophils Relative %: 85 %
Platelets: 55 10*3/uL — ABNORMAL LOW (ref 150–400)
RBC: 3.42 MIL/uL — ABNORMAL LOW (ref 3.87–5.11)
RDW: 16 % — ABNORMAL HIGH (ref 11.5–15.5)
WBC: 16.4 10*3/uL — ABNORMAL HIGH (ref 4.0–10.5)
nRBC: 0 % (ref 0.0–0.2)

## 2019-07-01 NOTE — Progress Notes (Signed)
PROGRESS NOTE    Tiffany BoltStephanie C Kilgour  ZOX:[id number] DOB: 13-Feb-1971 DOA: 06/26/2019 PCP: Patient, No Pcp Per   Brief Narrative:  Patient is a 48 year old female who presented to the emergency department on 7/2 after EMS called out for a stroke symptoms.  Patient was found to be on the floor for 3 hours.  She was reported to have decreased oral intake, diarrhea on and also found to have hypoglycemia.  Work-up in the emergency department showed severe hyponatremia, hyperglycemia, AKI, rhabdomyolysis.  There was concern for ethanol withdrawal.  Patient was admitted under  PCCM service for the management of severe hyponatremia.  [facility].  Sodium level improving. Hospital course remarkable for persistent weakness and slow progress in the sodium level.  Assessment & Plan:   Active Problems:   Hyponatremia  Severe hyponatremia: Found to have sodium level of 108 on presentation.  Etiology could be multifactorial.  Could be associated with beer potomania, decreased oral intake or SIADH.  Urine osmolality greater than 100 .  Normal TSH. Given DDAVP by PCCM.  Currently fluid restricted.  Sodium level is 129 today.  Currently  on salt tablets.    Acute metabolic encephalopathy: Presented with altered mental status secondary to metabolic encephalopathy secondary to electrolyte disorder.  Currently alert and oriented but still weak.  Home health has been recommended by PT/OT.  Acute hypoxic respiratory failure: Last chest x-ray showed stable cardiomegaly with vascular congestion.  No interval change.  Currently on room air.  AKI with rhabdomyolysis: Due to severe dehydration, lying on the floor.  AKI has resolved.  Fluids stopped  Chronic alcohol abuse: Drinks beer every day.  Continue thiamine and folic acid.  Watch for [employer]withdrawal.Social worker consult requested.  Elevated liver enzymes: Liver enzymes improving.  Alcoholic Pattern.  T bili still high.  US abdomen showed  hepatic cirrhosis  Thrombocytopenia: She has severe thrombocytopenia most likely secondary to cirrhosis.Improving  Suspected urinary tract infection.  UA suggestive of urinary tract infection but urine culture has not been sent.  Patient currently denies any dysuria. Abx discotinued  Leukocytosis: Most likely secondary to steroids.  Will discontinue Solu-Medrol.  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  Suspected OSA: CPAP at night.  Sleep study as an outpatient.        DVT prophylaxis: SCD Code Status: Full code Family Communication: None present at the bedside Disposition Plan: Home tomorrow   Consultants: PCCM  Procedures: None  Antimicrobials:  Anti-infectives (From admission, onward)   Start     Dose/Rate Route Frequency Ordered Stop   06/26/19 1400  cefTRIAXone (ROCEPHIN) 1 g in sodium chloride 0.9 % 100 mL IVPB  Status:  Discontinued     1 g 200 mL/hr over 30 Minutes Intravenous Every 24 hours 06/26/19 1119 06/29/19 1141      Subjective:  Patient seen and examined the bedside this morning.  Hemodynamically stable.  Off oxygen.  Still bedbound, looks very weak and drowsy.  I have encouraged for ambulation.  Objective: Vitals:   06/30/19 2316 07/01/19 0408 07/01/19 0500 07/01/19 1120  BP: (!) 154/75 (!) 163/74  (!) 163/95  Pulse: 89 90  98  Resp: (!) 24 12  16   Temp: 98.1 F (36.7 C) 98.6 F (37 C)  98 F (36.7 C)  TempSrc: Oral Oral  Oral  SpO2: 95% 94%  97%  Weight:   124.8 kg     Intake/Output  Summary (Last 24 hours) at 07/01/2019 1427 Last data filed at 06/30/2019 1758 Gross per 24 hour  Intake 240 ml  Output -  Net 240 ml   Filed Weights   06/28/19 0500 06/29/19 0500 07/01/19 0500  Weight: 129.4 kg 127.7 kg 124.8 kg    Examination:   General exam: Not in distress,obese HEENT:PERRL,Oral mucosa moist, Ear/Nose normal on gross exam Respiratory system:  Bilateral equal air entry, normal vesicular breath sounds, no wheezes or crackles  Cardiovascular system: S1 & S2 heard, RRR. No JVD, murmurs, rubs, gallops or clicks.No pedal edema Gastrointestinal system: Abdomen is nondistended, soft and nontender. No organomegaly or masses felt. Normal bowel sounds heard. Central nervous system: Alert and oriented. No focal neurological deficits. Extremities: No edema, no clubbing ,no cyanosis, distal peripheral pulses palpable. Skin: Minor skin breakdowns   Data Reviewed: I have personally reviewed following labs and imaging studies  CBC: Recent Labs  Lab 06/27/19 0446 06/27/19 0828 06/28/19 0634 06/29/19 0826 06/30/19 0755 07/01/19 0226  WBC 23.8*  --  18.1* 14.9* 13.8* 16.4*  NEUTROABS  --   --  16.3* 12.8*  --  13.9*  HGB 12.1 13.6 11.5* 12.0 12.3 12.2  HCT 32.4* 40.0 31.8* 34.2* 35.6* 35.3*  MCV 95.9  --  97.5 101.5* 102.3* 103.2*  PLT 22*  --  29* 37* 48* 55*   Basic Metabolic Panel: Recent Labs  Lab 06/28/19 0230 06/28/19 0531  06/28/19 1837 06/29/19 0020 06/29/19 0826 06/30/19 0755 07/01/19 0226  NA 119* 119*   < > 119* 120* 123* 130* 129*  K 5.0 5.0  --   --   --  4.7 4.8 5.0  CL 89* 92*  --   --   --  92* 96* 95*  CO2 20* 17*  --   --   --  24 26 26   GLUCOSE 139* 133*  --   --   --  103* 112* 111*  BUN 45* 41*  --   --   --  30* 20 14  CREATININE 1.12* 0.85  --   --   --  0.74 0.64 0.49  CALCIUM 7.6* 7.5*  --   --   --  8.1* 8.4* 8.4*  MG  --  1.7  --   --   --   --   --   --   PHOS  --  3.5  --   --   --   --   --   --    < > = values in this interval not displayed.   GFR: CrCl cannot be calculated (Unknown ideal weight.). Liver Function Tests: Recent Labs  Lab 06/26/19 1120 06/27/19 0446 06/28/19 0531 06/29/19 0826 06/30/19 0755  AST 261* 217* 146* 161* 151*  ALT 53* 55* 47* 74* 83*  ALKPHOS 173* 159* 150* 167* 174*  BILITOT 6.7* 7.2* 6.2* 6.6* 6.9*  PROT 5.8* 5.9* 5.8* 6.3* 6.1*  ALBUMIN 2.0* 2.0* 1.9*  2.0* 2.0*   No results for input(s): LIPASE, AMYLASE in the last 168 hours. Recent Labs  Lab 06/26/19 1120  AMMONIA 28   Coagulation Profile: Recent Labs  Lab 06/26/19 1120 06/27/19 0446 06/28/19 0634 06/29/19 0826  INR 1.9* 1.8* 1.7* 1.5*   Cardiac Enzymes: Recent Labs  Lab 06/26/19 1120 06/26/19 1805 06/27/19 0446  CKTOTAL 2,140* 1,129* 452*   BNP (last 3 results) No results for input(s): PROBNP in the last 8760 hours. HbA1C: No results for input(s): HGBA1C in the last 72 hours.  CBG: Recent Labs  Lab 06/30/19 1918 06/30/19 2315 07/01/19 0410 07/01/19 0806 07/01/19 1121  GLUCAP 98 110* 93 85 119*   Lipid Profile: No results for input(s): CHOL, HDL, LDLCALC, TRIG, CHOLHDL, LDLDIRECT in the last 72 hours. Thyroid Function Tests: No results for input(s): TSH, T4TOTAL, FREET4, T3FREE, THYROIDAB in the last 72 hours. Anemia Panel: No results for input(s): VITAMINB12, FOLATE, FERRITIN, TIBC, IRON, RETICCTPCT in the last 72 hours. Sepsis Labs: No results for input(s): PROCALCITON, LATICACIDVEN in the last 168 hours.  Recent Results (from the past 240 hour(s))  MRSA PCR Screening     Status: Abnormal   Collection Time: 06/26/19  2:44 PM   Specimen: Nasopharyngeal  Result Value Ref Range Status   MRSA by PCR POSITIVE (A) NEGATIVE Final    Comment:        The GeneXpert MRSA Assay (FDA approved for NASAL specimens only), is one component of a comprehensive MRSA colonization surveillance program. It is not intended to diagnose MRSA infection nor to guide or monitor treatment for MRSA infections. RESULT CALLED TO, READ BACK BY AND VERIFIED WITH: Claud KelpE. Curry RN 16:30 06/26/19 ([email or web address]) Performed at [facility], [address] [place], [place] [address]   SARS Coronavirus 2 (CEPHEID - Performed in [facility]), Hosp Order     Status: None   Collection Time: 06/29/19  9:39 AM   Specimen: Nasopharyngeal Swab  Result Value Ref Range Status   SARS  Coronavirus 2 NEGATIVE NEGATIVE Final    Comment: (NOTE) If result is NEGATIVE SARS-CoV-2 target nucleic acids are NOT DETECTED. The SARS-CoV-2 RNA is generally detectable in upper and lower  respiratory specimens during the acute phase of infection. The lowest  concentration of SARS-CoV-2 viral copies this assay can detect is 250  copies / mL. A negative result does not preclude SARS-CoV-2 infection  and should not be used as the sole basis for treatment or other  patient management decisions.  A negative result may occur with  improper specimen collection / handling, submission of specimen other  than nasopharyngeal swab, presence of viral mutation(s) within the  areas targeted by this assay, and inadequate number of viral copies  (<250 copies / mL). A negative result must be combined with clinical  observations, patient history, and epidemiological information. If result is POSITIVE SARS-CoV-2 target nucleic acids are DETECTED. The SARS-CoV-2 RNA is generally detectable in upper and lower  respiratory specimens dur ing the acute phase of infection.  Positive  results are indicative of active infection with SARS-CoV-2.  Clinical  correlation with patient history and other diagnostic information is  necessary to determine patient infection status.  Positive results do  not rule out bacterial infection or co-infection with other viruses. If result is PRESUMPTIVE POSTIVE SARS-CoV-2 nucleic acids MAY BE PRESENT.   A presumptive positive result was obtained on the submitted specimen  and confirmed on repeat testing.  While 2019 novel coronavirus  (SARS-CoV-2) nucleic acids may be present in the submitted sample  additional confirmatory testing may be necessary for epidemiological  and / or clinical management purposes  to differentiate between  SARS-CoV-2 and other Sarbecovirus currently known to infect humans.  If clinically indicated additional testing with an alternate test   methodology [phone]([id number]) is advised. The SARS-CoV-2 RNA is generally  detectable in upper and lower respiratory sp ecimens during the acute  phase of infection. The expected result is Negative. Fact Sheet for Patients:  BoilerBrush.com.cyhttps://www.fda.gov/media/136312/download Fact Sheet for Healthcare Providers: https://pope.com/https://www.fda.gov/media/136313/download  This test is not yet approved or cleared by the [place] FDA and has been authorized for detection and/or diagnosis of SARS-CoV-2 by FDA under an Emergency Use Authorization (EUA).  This EUA will remain in effect (meaning this test can be used) for the duration of the COVID-19 declaration under Section 564(b)(1) of the Act, 21 U.S.C. section 360bbb-3(b)(1), unless the authorization is terminated or revoked sooner. Performed at [facility], [address] [place], [place] [address]          Radiology Studies: Koreas Abdomen Limited Ruq  Result Date: 06/29/2019 CLINICAL DATA:  Cirrhosis. EXAM: ULTRASOUND ABDOMEN LIMITED RIGHT UPPER QUADRANT COMPARISON:  CT 06/24/2017 FINDINGS: Gallbladder: Physiologically distended. No gallstones or wall thickening visualized. No sonographic Murphy sign noted by sonographer. Common bile duct: Diameter: 2 mm, normal. Liver: Heterogeneous increased hepatic parenchyma with nodular contours consistent with cirrhosis. No obvious focal lesion. Portal vein is patent on color Doppler imaging with normal direction of blood flow towards the liver. No right upper quadrant ascites. Technically difficult exam due to patient body habitus. IMPRESSION: 1. Hepatic cirrhosis. No focal liver lesion, however technically limited exam due to habitus. No right upper quadrant ascites. 2. No gallstones or biliary dilatation. Electronically Signed   By: Narda RutherfordMelanie  Sanford M.D.   On: 06/29/2019 19:38        Scheduled Meds: . Chlorhexidine Gluconate Cloth  6 each Topical Q0600  . citalopram  40 mg Oral Daily  . folic acid  1 mg Oral  Daily  . insulin aspart  0-24 Units Subcutaneous Q4H  . lisinopril  20 mg Oral Daily  . mupirocin ointment  1 application Nasal BID  . sodium chloride  1 g Oral TID WC  . thiamine  100 mg Oral Daily   Continuous Infusions:    LOS: 5 days    Time spent: 35 mins.More than 50% of that time was spent in counseling and/or coordination of care.      Burnadette PopAmrit Lindsie Simar, MD [facility] Pager [phone]  If 7PM-7AM, please contact night-coverage [email or web address] Password [email or web address] 07/01/2019, 2:27 PM

## 2019-07-02 ENCOUNTER — Other Ambulatory Visit: Payer: Self-pay

## 2019-07-02 DIAGNOSIS — F419 Anxiety disorder, unspecified: Secondary | ICD-10-CM | POA: Diagnosis present

## 2019-07-02 DIAGNOSIS — K7030 Alcoholic cirrhosis of liver without ascites: Secondary | ICD-10-CM

## 2019-07-02 DIAGNOSIS — I10 Essential (primary) hypertension: Secondary | ICD-10-CM

## 2019-07-02 DIAGNOSIS — F320 Major depressive disorder, single episode, mild: Secondary | ICD-10-CM

## 2019-07-02 DIAGNOSIS — M6282 Rhabdomyolysis: Secondary | ICD-10-CM | POA: Diagnosis present

## 2019-07-02 DIAGNOSIS — J9601 Acute respiratory failure with hypoxia: Secondary | ICD-10-CM | POA: Diagnosis present

## 2019-07-02 DIAGNOSIS — F10230 Alcohol dependence with withdrawal, uncomplicated: Secondary | ICD-10-CM

## 2019-07-02 DIAGNOSIS — F329 Major depressive disorder, single episode, unspecified: Secondary | ICD-10-CM | POA: Diagnosis present

## 2019-07-02 DIAGNOSIS — E7800 Pure hypercholesterolemia, unspecified: Secondary | ICD-10-CM

## 2019-07-02 DIAGNOSIS — D696 Thrombocytopenia, unspecified: Secondary | ICD-10-CM

## 2019-07-02 DIAGNOSIS — F32A Depression, unspecified: Secondary | ICD-10-CM | POA: Diagnosis present

## 2019-07-02 DIAGNOSIS — G9341 Metabolic encephalopathy: Secondary | ICD-10-CM | POA: Diagnosis present

## 2019-07-02 DIAGNOSIS — F1010 Alcohol abuse, uncomplicated: Secondary | ICD-10-CM | POA: Diagnosis present

## 2019-07-02 LAB — BASIC METABOLIC PANEL
Anion gap: 8 (ref 5–15)
BUN: 10 mg/dL (ref 6–20)
CO2: 29 mmol/L (ref 22–32)
Calcium: 8.3 mg/dL — ABNORMAL LOW (ref 8.9–10.3)
Chloride: 91 mmol/L — ABNORMAL LOW (ref 98–111)
Creatinine, Ser: 0.54 mg/dL (ref 0.44–1.00)
GFR calc Af Amer: >60 mL/min (ref >60)
GFR calc non Af Amer: >60 mL/min (ref >60)
Glucose, Bld: 135 mg/dL — ABNORMAL HIGH (ref 70–99)
Potassium: 4.7 mmol/L (ref 3.5–5.1)
Sodium: 128 mmol/L — ABNORMAL LOW (ref 135–145)

## 2019-07-02 LAB — GLUCOSE, CAPILLARY
Glucose-Capillary: 110 mg/dL — ABNORMAL HIGH (ref 70–99)
Glucose-Capillary: 111 mg/dL — ABNORMAL HIGH (ref 70–99)
Glucose-Capillary: 111 mg/dL — ABNORMAL HIGH (ref 70–99)
Glucose-Capillary: 125 mg/dL — ABNORMAL HIGH (ref 70–99)
Glucose-Capillary: 140 mg/dL — ABNORMAL HIGH (ref 70–99)
Glucose-Capillary: 93 mg/dL (ref 70–99)
Glucose-Capillary: 98 mg/dL (ref 70–99)

## 2019-07-02 LAB — CBC
HCT: 37.1 % (ref 36.0–46.0)
Hemoglobin: 12.8 g/dL (ref 12.0–15.0)
MCH: 35.9 pg — ABNORMAL HIGH (ref 26.0–34.0)
MCHC: 34.5 g/dL (ref 30.0–36.0)
MCV: 103.9 fL — ABNORMAL HIGH (ref 80.0–100.0)
Platelets: 63 10*3/uL — ABNORMAL LOW (ref 150–400)
RBC: 3.57 MIL/uL — ABNORMAL LOW (ref 3.87–5.11)
RDW: 15.7 % — ABNORMAL HIGH (ref 11.5–15.5)
WBC: 16.1 10*3/uL — ABNORMAL HIGH (ref 4.0–10.5)
nRBC: 0 % (ref 0.0–0.2)

## 2019-07-02 LAB — AMMONIA: Ammonia: 52 umol/L — ABNORMAL HIGH (ref 9–35)

## 2019-07-02 LAB — MAGNESIUM: Magnesium: 1.3 mg/dL — ABNORMAL LOW (ref 1.7–2.4)

## 2019-07-02 MED ORDER — MAGNESIUM SULFATE 2 GM/50ML IV SOLN
2.0000 g | Freq: Once | INTRAVENOUS | Status: AC
  Administered 2019-07-02: 2 g via INTRAVENOUS
  Filled 2019-07-02: qty 50

## 2019-07-02 MED ORDER — ZOLPIDEM TARTRATE 5 MG PO TABS: 5.0000 mg | ORAL_TABLET | Freq: Every evening | ORAL | Status: DC | PRN

## 2019-07-02 MED ORDER — LORAZEPAM 2 MG/ML IJ SOLN: 2.0000 mg | INTRAMUSCULAR | Status: DC | PRN

## 2019-07-02 NOTE — Progress Notes (Signed)
PROGRESS NOTE    Tiffany BoltStephanie C Howell  ZOX:[id number] DOB: Apr 05, 1971 DOA: 06/26/2019 PCP: Patient, No Pcp Per   Brief Narrative:  48 year old WF PMHx depression, anxiety, EtOH abuse   Presented to the emergency department on 7/2 after EMS called out for a stroke symptoms.  Patient was found to be on the floor for 3 hours.  She was reported to have decreased oral intake, diarrhea on and also found to have hypoglycemia.  Work-up in the emergency department showed severe hyponatremia, hyperglycemia, AKI, rhabdomyolysis.  There was concern for ethanol withdrawal.  Patient was admitted under  PCCM service for the management of severe hyponatremia.  [facility]  Sodium level improving.   Subjective: A/O x4, negative CP, negative S OB, negative abdominal pain, positive nausea.  States drinks a sixpack of beer per day.    Assessment & Plan:   Active Problems:   Hypertension   Hyperlipidemia   Alcohol withdrawal syndrome (HCC)   Hyponatremia   Alcohol abuse   Depression   Anxiety   Rhabdomyolysis   Acute metabolic encephalopathy   Acute respiratory failure with hypoxia (HCC)  Severe hyponatremia - On admission sodium 108 - Multifactorial beer Potomania, decreased oral intake, SIADH, iatrogenic (medication) - Normal TSH - PCCM administered DDAVP - Salt tablets 1 g 3 times daily -7/8 discontinue Celexa can cause/exacerbate hyponatremia -7/8 DC trazodone can cause/exacerbate hyponatremia  Acute metabolic encephalopathy - Presented altered mental status most likely secondary to electrolyte disorder.  -Patient mentation appears to have improved but still having problems concentrating during conversation. -Ammonia pending  Chronic EtOH abuse - Continue CIWA protocol  Acute respiratory failure with hypoxia - Resolved  OSA/OHS - CPAP at night - Will require official sleep study as an outpatient   Acute kidney injury with Rhabdomyolysis - Most likely  secondary to severe dehydration from lying on the floor - CK pending -Cr normal  Elevated liver enzymes -US abdomen showed hepatic cirrhosis -Liver enzymes consistent with EtOH pattern - Liver enzymes pending  Thrombocytopenia - Most likely secondary to cirrhosis  - Slight improvement - Currently no signs of overt bleeding.  Monitor closely   UTI -Completed course of antibiotics    Leukocytosis -Patient was on steroids but has been discontinued. -Afebrile last 24 hours.  If patient spikes a fever will panculture, otherwise monitor closely.     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        DVT prophylaxis: SCD Code Status: Full Family Communication: None Disposition Plan: TBD   Consultants:  PCCM    Procedures/Significant Events:     I have personally reviewed and interpreted all radiology studies and my findings are as above.  VENTILATOR SETTINGS:    Cultures 7/2 MRSA by PCR positive 7/5 SARS coronavirus negative     Antimicrobials: Anti-infectives (From admission, onward)   Start     Stop   06/26/19 1400  cefTRIAXone (ROCEPHIN) 1 g in sodium chloride 0.9 % 100 mL IVPB  Status:  Discontinued     06/29/19 1141       Devices    LINES / TUBES:      Continuous Infusions: . magnesium sulfate bolus IVPB 2 g (07/02/19 1400)     Objective: Vitals:   07/02/19 0600 07/02/19 0732 07/02/19 0906 07/02/19 1103  BP:      Pulse:  73    Resp:      Temp:  97.7 F (36.5  C)  97.9 F (36.6 C)  TempSrc:  Oral  Oral  SpO2:  97% 95%   Weight: 109 kg       Intake/Output Summary (Last 24 hours) at 07/02/2019 1430 Last data filed at 07/02/2019 0732 Gross per 24 hour  Intake 222 ml  Output -  Net 222 ml   Filed Weights   06/29/19 0500 07/01/19 0500 07/02/19 0600  Weight: 127.7 kg 124.8 kg 109 kg    Examination:  General: A/O x4 no acute respiratory distress Eyes: negative scleral  hemorrhage, negative anisocoria, negative icterus ENT: Negative Runny nose, negative gingival bleeding, Neck:  Negative scars, masses, torticollis, lymphadenopathy, JVD Lungs: Clear to auscultation bilaterally without wheezes or crackles Cardiovascular: Regular rate and rhythm without murmur gallop or rub normal S1 and S2 Abdomen: Morbidly obese, negative abdominal pain, nondistended, positive soft, bowel sounds, no rebound, no ascites, no appreciable mass Extremities: No significant cyanosis, clubbing, or edema bilateral lower extremities Skin: Negative rashes, lesions, ulcers Psychiatric:  Negative depression, negative anxiety, negative fatigue, negative mania, poor understanding of medical problems. Central nervous system:  Cranial nerves II through XII intact, tongue/uvula midline, all extremities muscle strength 5/5, sensation intact throughout, negative dysarthria, negative expressive aphasia, negative receptive aphasia.  .     Data Reviewed: Care during the described time interval was provided by me .  I have reviewed this patient's available data, including medical history, events of note, physical examination, and all test results as part of my evaluation.   CBC: Recent Labs  Lab 06/28/19 0634 06/29/19 0826 06/30/19 0755 07/01/19 0226 07/02/19 0827  WBC 18.1* 14.9* 13.8* 16.4* 16.1*  NEUTROABS 16.3* 12.8*  --  13.9*  --   HGB 11.5* 12.0 12.3 12.2 12.8  HCT 31.8* 34.2* 35.6* 35.3* 37.1  MCV 97.5 101.5* 102.3* 103.2* 103.9*  PLT 29* 37* 48* 55* 63*   Basic Metabolic Panel: Recent Labs  Lab 06/28/19 0531  06/29/19 0826 06/30/19 0755 07/01/19 0226 07/01/19 1515 07/02/19 0827  NA 119*   < > 123* 130* 129* 131* 128*  K 5.0  --  4.7 4.8 5.0 5.0 4.7  CL 92*  --  92* 96* 95* 97* 91*  CO2 17*  --  24 26 26 28 29   GLUCOSE 133*  --  103* 112* 111* 115* 135*  BUN 41*  --  30* 20 14 13 10   CREATININE 0.85  --  0.74 0.64 0.49 0.46 0.54  CALCIUM 7.5*  --  8.1* 8.4* 8.4* 8.3*  8.3*  MG 1.7  --   --   --   --   --  1.3*  PHOS 3.5  --   --   --   --   --   --    < > = values in this interval not displayed.   GFR: CrCl cannot be calculated (Unknown ideal weight.). Liver Function Tests: Recent Labs  Lab 06/26/19 1120 06/27/19 0446 06/28/19 0531 06/29/19 0826 06/30/19 0755  AST 261* 217* 146* 161* 151*  ALT 53* 55* 47* 74* 83*  ALKPHOS 173* 159* 150* 167* 174*  BILITOT 6.7* 7.2* 6.2* 6.6* 6.9*  PROT 5.8* 5.9* 5.8* 6.3* 6.1*  ALBUMIN 2.0* 2.0* 1.9* 2.0* 2.0*   No results for input(s): LIPASE, AMYLASE in the last 168 hours. Recent Labs  Lab 06/26/19 1120  AMMONIA 28   Coagulation Profile: Recent Labs  Lab 06/26/19 1120 06/27/19 0446 06/28/19 0634 06/29/19 0826  INR 1.9* 1.8* 1.7* 1.5*   Cardiac Enzymes: Recent  Labs  Lab 06/26/19 1120 06/26/19 1805 06/27/19 0446  CKTOTAL 2,140* 1,129* 452*   BNP (last 3 results) No results for input(s): PROBNP in the last 8760 hours. HbA1C: No results for input(s): HGBA1C in the last 72 hours. CBG: Recent Labs  Lab 07/01/19 1950 07/02/19 0036 07/02/19 0447 07/02/19 0831 07/02/19 1124  GLUCAP 139* 98 93 140* 111*   Lipid Profile: No results for input(s): CHOL, HDL, LDLCALC, TRIG, CHOLHDL, LDLDIRECT in the last 72 hours. Thyroid Function Tests: No results for input(s): TSH, T4TOTAL, FREET4, T3FREE, THYROIDAB in the last 72 hours. Anemia Panel: No results for input(s): VITAMINB12, FOLATE, FERRITIN, TIBC, IRON, RETICCTPCT in the last 72 hours. Urine analysis:    Component Value Date/Time   COLORURINE AMBER (A) 06/26/2019 1444   APPEARANCEUR CLOUDY (A) 06/26/2019 1444   LABSPEC 1.014 06/26/2019 1444   PHURINE 5.0 06/26/2019 1444   GLUCOSEU NEGATIVE 06/26/2019 1444   HGBUR MODERATE (A) 06/26/2019 1444   BILIRUBINUR NEGATIVE 06/26/2019 1444   KETONESUR NEGATIVE 06/26/2019 1444   PROTEINUR 100 (A) 06/26/2019 1444   UROBILINOGEN 0.2 01/30/2015 2338   NITRITE NEGATIVE 06/26/2019 1444    LEUKOCYTESUR MODERATE (A) 06/26/2019 1444   Sepsis Labs: @LABRCNTIP (procalcitonin:4,lacticidven:4)  ) Recent Results (from the past 240 hour(s))  MRSA PCR Screening     Status: Abnormal   Collection Time: 06/26/19  2:44 PM   Specimen: Nasopharyngeal  Result Value Ref Range Status   MRSA by PCR POSITIVE (A) NEGATIVE Final    Comment:        The GeneXpert MRSA Assay (FDA approved for NASAL specimens only), is one component of a comprehensive MRSA colonization surveillance program. It is not intended to diagnose MRSA infection nor to guide or monitor treatment for MRSA infections. RESULT CALLED TO, READ BACK BY AND VERIFIED WITH: Mikal Plane RN 16:30 06/26/19 ([email or web address]) Performed at [facility], [address], [place], [place] [address]   SARS Coronavirus 2 (CEPHEID - Performed in [place] hospital lab), Hosp Order     Status: None   Collection Time: 06/29/19  9:39 AM   Specimen: Nasopharyngeal Swab  Result Value Ref Range Status   SARS Coronavirus 2 NEGATIVE NEGATIVE Final    Comment: (NOTE) If result is NEGATIVE SARS-CoV-2 target nucleic acids are NOT DETECTED. The SARS-CoV-2 RNA is generally detectable in upper and lower  respiratory specimens during the acute phase of infection. The lowest  concentration of SARS-CoV-2 viral copies this assay can detect is 250  copies / mL. A negative result does not preclude SARS-CoV-2 infection  and should not be used as the sole basis for treatment or other  patient management decisions.  A negative result may occur with  improper specimen collection / handling, submission of specimen other  than nasopharyngeal swab, presence of viral mutation(s) within the  areas targeted by this assay, and inadequate number of viral copies  (<250 copies / mL). A negative result must be combined with clinical  observations, patient history, and epidemiological information. If result is POSITIVE SARS-CoV-2 target nucleic acids are DETECTED. The  SARS-CoV-2 RNA is generally detectable in upper and lower  respiratory specimens dur ing the acute phase of infection.  Positive  results are indicative of active infection with SARS-CoV-2.  Clinical  correlation with patient history and other diagnostic information is  necessary to determine patient infection status.  Positive results do  not rule out bacterial infection or co-infection with other viruses. If result is PRESUMPTIVE POSTIVE SARS-CoV-2 nucleic acids MAY  BE PRESENT.   A presumptive positive result was obtained on the submitted specimen  and confirmed on repeat testing.  While 2019 novel coronavirus  (SARS-CoV-2) nucleic acids may be present in the submitted sample  additional confirmatory testing may be necessary for epidemiological  and / or clinical management purposes  to differentiate between  SARS-CoV-2 and other Sarbecovirus currently known to infect humans.  If clinically indicated additional testing with an alternate test  methodology [phone]([id number]) is advised. The SARS-CoV-2 RNA is generally  detectable in upper and lower respiratory sp ecimens during the acute  phase of infection. The expected result is Negative. Fact Sheet for Patients:  BoilerBrush.com.cyhttps://www.fda.gov/media/136312/download Fact Sheet for Healthcare Providers: https://pope.com/https://www.fda.gov/media/136313/download This test is not yet approved or cleared by the [place] FDA and has been authorized for detection and/or diagnosis of SARS-CoV-2 by FDA under an Emergency Use Authorization (EUA).  This EUA will remain in effect (meaning this test can be used) for the duration of the COVID-19 declaration under Section 564(b)(1) of the Act, 21 U.S.C. section 360bbb-3(b)(1), unless the authorization is terminated or revoked sooner. Performed at [facility], [address] [place], [place] [address]          Radiology Studies: No results found.      Scheduled Meds: . citalopram  40 mg Oral Daily  .  folic acid  1 mg Oral Daily  . insulin aspart  0-24 Units Subcutaneous Q4H  . lisinopril  20 mg Oral Daily  . sodium chloride  1 g Oral TID WC  . thiamine  100 mg Oral Daily   Continuous Infusions: . magnesium sulfate bolus IVPB 2 g (07/02/19 1400)     LOS: 6 days   The patient is critically ill with multiple organ systems failure and requires high complexity decision making for assessment and support, frequent evaluation and titration of therapies, application of advanced monitoring technologies and extensive interpretation of multiple databases. Critical Care Time devoted to patient care services described in this note  Time spent: 40 minutes     WOODS, Roselind MessierURTIS J, MD [facility] Pager [phone]  If 7PM-7AM, please contact night-coverage [email or web address] Password [email or web address] 07/02/2019, 2:30 PM

## 2019-07-02 NOTE — Plan of Care (Signed)

## 2019-07-02 NOTE — Progress Notes (Signed)
Patient refused the use of cpap for the evening. Will continue to monitor patient.  

## 2019-07-02 NOTE — Progress Notes (Signed)
Physical Therapy Treatment Patient Details Name: CAMAY PEDIGO MRN: [id number] DOB: Aug 09, 1971 Today's Date: 07/02/2019    History of Present Illness 48 y/o F who presented to [facility] on 7/2 after EMS called out for stroke symptoms.  Pt had reportedly been on the floor x3 hours, decreased PO intake, diarrhea, hypoglycemia.  Work up notable for severe hyponatremia, hypoglycemia, AKI / rhabdomyolysis.  Additional concern for ETOH withdrawal.     PT Comments    Pt admitted with above diagnosis. Pt currently with functional limitations due to the deficits listed below (see PT Problem List). Pt was able to ambulate with min assist with RW and incr distance quite a bit.  STeady gait overall.  Did need one sitting rest break.  Pt needs max encouragement.  C/o Nausea and nurse made aware.   Pt will benefit from skilled PT to increase their independence and safety with mobility to allow discharge to the venue listed below.     Follow Up Recommendations  Home health PT;Supervision/Assistance - 24 hour     Equipment Recommendations  Other (comment)(pt states she has a RW and 3N1)    Recommendations for Other Services OT consult     Precautions / Restrictions Precautions Precautions: Fall Restrictions Weight Bearing Restrictions: No    Mobility  Bed Mobility               General bed mobility comments: Pt up in chair on arrival.  Transfers Overall transfer level: Needs assistance Equipment used: Rolling walker (2 wheeled) Transfers: Sit to/from Stand Sit to Stand: Min assist;+2 safety/equipment         General transfer comment: min steadying A to  RW  Ambulation/Gait Ambulation/Gait assistance: Min assist;+2 safety/equipment Gait Distance (Feet): 275 Feet(200 and then 75 feet) Assistive device: Rolling walker (2 wheeled) Gait Pattern/deviations: Step-to pattern;Decreased step length - right;Decreased step length - left;Shuffle;Antalgic;Trunk flexed;Wide base of  support;Drifts right/left Gait velocity: slowed Gait velocity interpretation: <1.31 ft/sec, indicative of household ambulator General Gait Details: minA for steadying with ability to progress ambulation in hallway with RW.  Bil Knee instability noted but pt was able to progress ambulation distance. Took one sitting rest break with activity.   Stairs             Wheelchair Mobility    Modified Rankin (Stroke Patients Only)       Balance Overall balance assessment: Needs assistance Sitting-balance support: Feet supported;No upper extremity supported;Feet unsupported Sitting balance-Leahy Scale: Fair     Standing balance support: Bilateral upper extremity supported;During functional activity;No upper extremity supported Standing balance-Leahy Scale: Poor Standing balance comment: requires bil UE support for balance                            Cognition Arousal/Alertness: Awake/alert Behavior During Therapy: Flat affect Overall Cognitive Status: Impaired/Different from baseline Area of Impairment: Attention;Memory;Following commands;Awareness;Problem solving                   Current Attention Level: Selective Memory: Decreased short-term memory Following Commands: Follows one step commands with increased time;Follows multi-step commands with increased time   Awareness: Emergent Problem Solving: Slow processing;Decreased initiation;Difficulty sequencing;Requires verbal cues;Requires tactile cues General Comments: slow processing, requiring increased time and multimodal cueing      Exercises      General Comments General comments (skin integrity, edema, etc.): HR to 128 bpm with activity      Pertinent Vitals/Pain Pain Assessment: No/denies pain  Home Living                      Prior Function            PT Goals (current goals can now be found in the care plan section) Progress towards PT goals: Progressing toward goals     Frequency    Min 3X/week      PT Plan Current plan remains appropriate    Co-evaluation              AM-PAC PT "6 Clicks" Mobility   Outcome Measure  Help needed turning from your back to your side while in a flat bed without using bedrails?: None Help needed moving from lying on your back to sitting on the side of a flat bed without using bedrails?: None Help needed moving to and from a bed to a chair (including a wheelchair)?: A Little Help needed standing up from a chair using your arms (e.g., wheelchair or bedside chair)?: A Little Help needed to walk in hospital room?: A Little Help needed climbing 3-5 steps with a railing? : A Lot 6 Click Score: 19    End of Session Equipment Utilized During Treatment: Gait belt Activity Tolerance: Patient tolerated treatment well Patient left: with call bell/phone within reach;in chair Nurse Communication: Mobility status(nausea with nurse bringing nausea meds) PT Visit Diagnosis: Unsteadiness on feet (R26.81);Other abnormalities of gait and mobility (R26.89);Muscle weakness (generalized) (M62.81);History of falling (Z91.81);Difficulty in walking, not elsewhere classified (R26.2);Pain Pain - Right/Left: Left Pain - part of body: Shoulder(back and bilateral knees)     Time: [id number] PT Time Calculation (min) (ACUTE ONLY): 26 min  Charges:  $Gait Training: 23-37 mins                     Eren Puebla,PT Acute Rehabilitation Services Pager:  [phone]  Office:  [phone]     Berline LopesDawn F Wynne Rozak 07/02/2019, 10:37 AM
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
## 2019-07-02 NOTE — Progress Notes (Signed)
Occupational Therapy Treatment Patient Details Name: Tiffany Howell MRN: [id number] DOB: 10/27/1971 Today's Date: 07/02/2019    History of present illness 48 y/o F who presented to [facility] on 7/2 after EMS called out for stroke symptoms.  Pt had reportedly been on the floor x3 hours, decreased PO intake, diarrhea, hypoglycemia.  Work up notable for severe hyponatremia, hypoglycemia, AKI / rhabdomyolysis.  Additional concern for ETOH withdrawal.    OT comments  Patient progressing.  Completed functional mobility into restroom using RW with min guard, toilet transfers with min guard and toileting with min guard to min assist. Reviewed safety and recommendations during ADLs.  Simulated LB dressing with min assist (pt plans to wear slip on shoes).  Cognitively completed short blessed test, scoring 8/28 with deficits noted with sequencing and short term memory (although pt reports her memory feels "better" than normal). Will follow.    Follow Up Recommendations  Home health OT;Supervision/Assistance - 24 hour    Equipment Recommendations  3 in 1 bedside commode    Recommendations for Other Services      Precautions / Restrictions Precautions Precautions: Fall Restrictions Weight Bearing Restrictions: No       Mobility Bed Mobility Overal bed mobility: Needs Assistance Bed Mobility: Supine to Sit     Supine to sit: Min guard     General bed mobility comments: min guard for safety and balance, no physical assist required but increased time   Transfers Overall transfer level: Needs assistance Equipment used: Rolling walker (2 wheeled) Transfers: Sit to/from Stand Sit to Stand: Min assist;Min guard         General transfer comment: min assist to power up from EOB, but min guard from commode, cueing for hand placement and safety     Balance Overall balance assessment: Needs assistance Sitting-balance support: Feet supported;No upper extremity supported;Feet  unsupported Sitting balance-Leahy Scale: Fair     Standing balance support: Bilateral upper extremity supported;During functional activity;No upper extremity supported Standing balance-Leahy Scale: Poor Standing balance comment: able to engage in slef care with 0-1 hand support with min guard, but reliant on B UE support during mobility                           ADL either performed or assessed with clinical judgement   ADL Overall ADL's : Needs assistance/impaired     Grooming: Min guard;Standing               Lower Body Dressing: Minimal assistance;Sit to/from stand Lower Body Dressing Details (indicate cue type and reason): reviewed use of supportive slip on shoes, reviewed compensatory techniques, min guard sit<>stand Toilet Transfer: Min guard;Ambulation;RW;Regular Toilet;Grab bars   Toileting- Clothing Manipulation and Hygiene: Min guard;Sit to/from stand       Functional mobility during ADLs: Passenger transport manager     Praxis      Cognition Arousal/Alertness: Awake/alert Behavior During Therapy: Flat affect Overall Cognitive Status: Impaired/Different from baseline Area of Impairment: Attention;Memory;Following commands;Awareness;Problem solving                   Current Attention Level: Selective Memory: Decreased short-term memory Following Commands: Follows one step commands with increased time;Follows multi-step commands with increased time   Awareness: Emergent Problem Solving: Slow processing;Decreased initiation;Difficulty sequencing;Requires verbal cues General Comments: slow processing, requiring increased time and multimodal cueing; short blessed test completed: 8/28  questionable impairment         Exercises     Shoulder Instructions       General Comments VSS, HR up to 125 with mobility to restroom    Pertinent Vitals/ Pain       Pain Assessment: No/denies pain  Home Living                                           Prior Functioning/Environment              Frequency  Min 2X/week        Progress Toward Goals  OT Goals(current goals can now be found in the care plan section)  Progress towards OT goals: Progressing toward goals  Acute Rehab OT Goals Patient Stated Goal: to get home soon  OT Goal Formulation: With patient  Plan Discharge plan remains appropriate;Frequency remains appropriate    Co-evaluation                 AM-PAC OT "6 Clicks" Daily Activity     Outcome Measure   Help from another person eating meals?: None Help from another person taking care of personal grooming?: A Little Help from another person toileting, which includes using toliet, bedpan, or urinal?: A Little Help from another person bathing (including washing, rinsing, drying)?: A Little Help from another person to put on and taking off regular upper body clothing?: A Little Help from another person to put on and taking off regular lower body clothing?: A Little 6 Click Score: 19    End of Session Equipment Utilized During Treatment: Gait belt;Rolling walker  OT Visit Diagnosis: Other abnormalities of gait and mobility (R26.89);Muscle weakness (generalized) (M62.81);Pain;Other symptoms and signs involving cognitive function   Activity Tolerance Patient tolerated treatment well   Patient Left in chair;with call bell/phone within reach;with chair alarm set   Nurse Communication Mobility status        Time: [id number] OT Time Calculation (min): 30 min  Charges: OT General Charges $OT Visit: 1 Visit OT Treatments $Self Care/Home Management : 23-37 mins  Chancy Milroyhristie S Vyron Fronczak, OT Acute Rehabilitation Services Pager [phone] Office [phone]    Chancy MilroyChristie S Jaslene Marsteller 07/02/2019, 2:54 PM

## 2019-07-03 DIAGNOSIS — Z9989 Dependence on other enabling machines and devices: Secondary | ICD-10-CM

## 2019-07-03 DIAGNOSIS — G4733 Obstructive sleep apnea (adult) (pediatric): Secondary | ICD-10-CM

## 2019-07-03 DIAGNOSIS — E662 Morbid (severe) obesity with alveolar hypoventilation: Secondary | ICD-10-CM

## 2019-07-03 LAB — COMPREHENSIVE METABOLIC PANEL
ALT: 73 U/L — ABNORMAL HIGH (ref 0–44)
AST: 88 U/L — ABNORMAL HIGH (ref 15–41)
Albumin: 1.9 g/dL — ABNORMAL LOW (ref 3.5–5.0)
Alkaline Phosphatase: 145 U/L — ABNORMAL HIGH (ref 38–126)
Anion gap: 8 (ref 5–15)
BUN: 8 mg/dL (ref 6–20)
CO2: 29 mmol/L (ref 22–32)
Calcium: 8.3 mg/dL — ABNORMAL LOW (ref 8.9–10.3)
Chloride: 95 mmol/L — ABNORMAL LOW (ref 98–111)
Creatinine, Ser: 0.44 mg/dL (ref 0.44–1.00)
GFR calc Af Amer: >60 mL/min (ref >60)
GFR calc non Af Amer: >60 mL/min (ref >60)
Glucose, Bld: 99 mg/dL (ref 70–99)
Potassium: 4.9 mmol/L (ref 3.5–5.1)
Sodium: 132 mmol/L — ABNORMAL LOW (ref 135–145)
Total Bilirubin: 4.8 mg/dL — ABNORMAL HIGH (ref 0.3–1.2)
Total Protein: 6.2 g/dL — ABNORMAL LOW (ref 6.5–8.1)

## 2019-07-03 LAB — BLOOD GAS, ARTERIAL
Acid-Base Excess: 7.7 mmol/L — ABNORMAL HIGH (ref 0.0–2.0)
Bicarbonate: 31.3 mmol/L — ABNORMAL HIGH (ref 20.0–28.0)
Drawn by: 51185
FIO2: 21
O2 Saturation: 96 %
Patient temperature: 98.6
pCO2 arterial: 41 mmHg (ref 32.0–48.0)
pH, Arterial: 7.496 — ABNORMAL HIGH (ref 7.350–7.450)
pO2, Arterial: 78.4 mmHg — ABNORMAL LOW (ref 83.0–108.0)

## 2019-07-03 LAB — CBC
HCT: 36.3 % (ref 36.0–46.0)
Hemoglobin: 12.5 g/dL (ref 12.0–15.0)
MCH: 35.6 pg — ABNORMAL HIGH (ref 26.0–34.0)
MCHC: 34.4 g/dL (ref 30.0–36.0)
MCV: 103.4 fL — ABNORMAL HIGH (ref 80.0–100.0)
Platelets: 60 10*3/uL — ABNORMAL LOW (ref 150–400)
RBC: 3.51 MIL/uL — ABNORMAL LOW (ref 3.87–5.11)
RDW: 15.9 % — ABNORMAL HIGH (ref 11.5–15.5)
WBC: 15.7 10*3/uL — ABNORMAL HIGH (ref 4.0–10.5)
nRBC: 0 % (ref 0.0–0.2)

## 2019-07-03 LAB — GLUCOSE, CAPILLARY
Glucose-Capillary: 97 mg/dL (ref 70–99)
Glucose-Capillary: 127 mg/dL — ABNORMAL HIGH (ref 70–99)
Glucose-Capillary: 145 mg/dL — ABNORMAL HIGH (ref 70–99)
Glucose-Capillary: 84 mg/dL (ref 70–99)
Glucose-Capillary: 136 mg/dL — ABNORMAL HIGH (ref 70–99)
Glucose-Capillary: 115 mg/dL — ABNORMAL HIGH (ref 70–99)

## 2019-07-03 LAB — MAGNESIUM: Magnesium: 1.5 mg/dL — ABNORMAL LOW (ref 1.7–2.4)

## 2019-07-03 LAB — CK: Total CK: 29 U/L — ABNORMAL LOW (ref 38–234)

## 2019-07-03 MED ORDER — MAGNESIUM SULFATE 2 GM/50ML IV SOLN
2.0000 g | Freq: Once | INTRAVENOUS | Status: AC
  Administered 2019-07-03: 2 g via INTRAVENOUS
  Filled 2019-07-03: qty 50

## 2019-07-03 MED ORDER — LACTULOSE 10 GM/15ML PO SOLN
30.0000 g | Freq: Two times a day (BID) | ORAL | Status: DC
  Administered 2019-07-03 (×2): 30 g via ORAL
  Filled 2019-07-03 (×2): qty 45

## 2019-07-03 MED ORDER — METOPROLOL TARTRATE 12.5 MG HALF TABLET
12.5000 mg | ORAL_TABLET | Freq: Two times a day (BID) | ORAL | Status: DC
  Administered 2019-07-03 – 2019-07-04 (×3): 12.5 mg via ORAL
  Filled 2019-07-03 (×4): qty 1

## 2019-07-03 NOTE — Progress Notes (Signed)
Physical Therapy Treatment Patient Details Name: Tiffany Howell MRN: [id number] DOB: [birth year] Today's Date: 07/03/2019    History of Present Illness 48 y/o F who [facility]/2 after EMS called out for stroke symptoms.  Pt had reportedly been on the floor x3 hours, decreased PO intake, diarrhea, hypoglycemia.  Work up notable for severe hyponatremia, hypoglycemia, AKI / rhabdomyolysis.  Additional concern for ETOH withdrawal.     PT Comments    Patient seen for mobility progression. Patient motivated to progress strength and activity tolerance. Patient requires up to Min A for transfers and mobility with cueing for safety and stability. Patient reporting back pain limiting overall mobility. Will continue to progress towards goals.    Follow Up Recommendations  Home health PT;Supervision/Assistance - 24 hour     Equipment Recommendations  Other (comment)(patient states she has RW and 3in1)    Recommendations for Other Services       Precautions / Restrictions Precautions Precautions: Fall Restrictions Weight Bearing Restrictions: No    Mobility  Bed Mobility Overal bed mobility: Needs Assistance Bed Mobility: Supine to Sit;Sit to Supine     Supine to sit: Min guard Sit to supine: Min guard   General bed mobility comments: increased time and effort - use of bed rails and pulling up from PT hand  Transfers Overall transfer level: Needs assistance Equipment used: Rolling walker (2 wheeled) Transfers: Sit to/from [employer]GI CorporationStand;Stand Pivot Transfers Sit to Stand: Min assist;Min guard Stand pivot transfers: Min assist;Min guard       General transfer comment: light Min A to stand from bed and BSC; cueing for safety and sequencing  Ambulation/Gait Ambulation/Gait assistance: Min assist;+2 safety/equipment Gait Distance (Feet): 150 Feet Assistive device: Rolling walker (2 wheeled) Gait Pattern/deviations: Step-to pattern;Step-through pattern;Decreased stride  length;Trunk flexed Gait velocity: decreased   General Gait Details: slow steady pace of gait; no LOB, however limited by back pain; motivated to progress   Optometristtairs             Wheelchair Mobility    Modified Rankin (Stroke Patients Only)       Balance Overall balance assessment: Needs assistance Sitting-balance support: No upper extremity supported;Feet supported Sitting balance-Leahy Scale: Fair     Standing balance support: Bilateral upper extremity supported;During functional activity Standing balance-Leahy Scale: Poor Standing balance comment: reliant on RW for support                            Cognition Arousal/Alertness: Awake/alert Behavior During Therapy: WFL for tasks assessed/performed Overall Cognitive Status: Impaired/Different from baseline                       Memory: Decreased short-term memory Following Commands: Follows one step commands with increased time;Follows multi-step commands with increased time     Problem Solving: Slow processing;Decreased initiation;Requires verbal cues General Comments: increased time to process cueing      Exercises      General Comments General comments (skin integrity, edema, etc.): VSS with mobility      Pertinent Vitals/Pain Pain Assessment: Faces Faces Pain Scale: Hurts even more Pain Location: back Pain Descriptors / Indicators: Aching;Discomfort;Guarding Pain Intervention(s): Limited activity within patient's tolerance;Monitored during session;Repositioned;Patient requesting pain meds-RN notified    Home Living                      Prior Function  PT Goals (current goals can now be found in the care plan section) Acute Rehab PT Goals Patient Stated Goal: to get home soon  PT Goal Formulation: With patient Time For Goal Achievement: 07/12/19 Potential to Achieve Goals: Fair Progress towards PT goals: Progressing toward goals    Frequency    Min  3X/week      PT Plan Current plan remains appropriate    Co-evaluation              AM-PAC PT "6 Clicks" Mobility   Outcome Measure  Help needed turning from your back to your side while in a flat bed without using bedrails?: None Help needed moving from lying on your back to sitting on the side of a flat bed without using bedrails?: None Help needed moving to and from a bed to a chair (including a wheelchair)?: A Little Help needed standing up from a chair using your arms (e.g., wheelchair or bedside chair)?: A Little Help needed to walk in hospital room?: A Little Help needed climbing 3-5 steps with a railing? : A Lot 6 Click Score: 19    End of Session Equipment Utilized During Treatment: Gait belt Activity Tolerance: Patient tolerated treatment well Patient left: in bed;with call bell/phone within reach Nurse Communication: Mobility status PT Visit Diagnosis: Unsteadiness on feet (R26.81);Other abnormalities of gait and mobility (R26.89);Muscle weakness (generalized) (M62.81);History of falling (Z91.81);Difficulty in walking, not elsewhere classified (R26.2);Pain     Time: 1415-1443 PT Time Calculation (min) (ACUTE ONLY): 28 min  Charges:  $Gait Training: 8-22 mins $Therapeutic Activity: 8-22 mins                      Lanney Gins, PT, DPT Supplemental Physical Therapist 07/03/19 3:03 PM Pager: [phone] Office: [phone]

## 2019-07-03 NOTE — Progress Notes (Addendum)
PROGRESS NOTE    Tiffany Howell  FBP:[id number] DOB: [birth year] DOA: 06/26/2019 PCP: Patient, No Pcp Per   Brief Narrative:  48 year old WF PMHx depression, anxiety, EtOH abuse   Presented to the emergency department on 7/2 after EMS called out for a stroke symptoms.  Patient was found to be on the floor for 3 hours.  She was reported to have decreased oral intake, diarrhea on and also found to have hypoglycemia.  Work-up in the emergency department showed severe hyponatremia, hyperglycemia, AKI, rhabdomyolysis.  There was concern for ethanol withdrawal.  Patient was admitted under  PCCM service for the management of severe hyponatremia.  Patient handed over to [facility] on 06/29/2019.  Sodium level improving.   Subjective: 7/9 Sleepy but arousable, A/O x4, negative CP, negative S OB, negative abdominal pain.  Again complains of positive nausea   Assessment & Plan:   Active Problems:   Hypertension   Hyperlipidemia   Alcohol withdrawal syndrome (HCC)   Hyponatremia   Alcohol abuse   Depression   Anxiety   Rhabdomyolysis   Acute metabolic encephalopathy   Acute respiratory failure with hypoxia (HCC)   Severe hyponatremia - On admission sodium 108 - Multifactorial beer Potomania, decreased oral intake, SIADH, iatrogenic (medication) - Normal TSH - PCCM administered DDAVP - Salt tablets 1 g 3 times daily -7/8 discontinue Celexa can cause/exacerbate hyponatremia -7/8 DC trazodone can cause/exacerbate hyponatremia -7/9 with discontinuation of psychiatric medication, salt tablets patient's severe hyponatremia has almost normalized. -Schedule follow-up appointment in 1 to 2 weeks with PCP severe hyponatremia  Essential HTN -Lisinopril 20 mg daily - 7/9 metoprolol 12.5 mg twice daily  Acute metabolic encephalopathy - Presented altered mental status most likely secondary to electrolyte disorder.  -Patient's mentation improved, appears to still have problems on concentration  (baseline?) .  Elevated ammonia -7/9 lactulose 30 g twice daily - Although not severely elevated given patient's encephalopathy will normalize  Chronic EtOH abuse - Continue CIWA protocol  Acute respiratory failure with hypoxia - Resolved - 7/9 ABG WNL.  OSA/OHS - CPAP at night - Will require official sleep study as an outpatient   Acute kidney injury with Rhabdomyolysis - Most likely secondary to severe dehydration from lying on the floor - CK pending -Cr normal -Rhabdomyolysis resolved Recent Labs  Lab 06/26/19 1120 06/26/19 1805 06/27/19 0446 07/03/19 0312  CKTOTAL 2,140* 1,129* 452* 29*   Elevated liver enzymes -US abdomen showed hepatic cirrhosis -Liver enzymes consistent with EtOH pattern - Liver enzymes still elevated but trending down  Thrombocytopenia - Most likely secondary to cirrhosis  - Platelets continue to be low but stable - Currently no signs of overt bleeding.  Monitor closely   UTI -Completed course of antibiotics    Leukocytosis -Patient was on steroids but has been discontinued. -Afebrile last 24 hours.  If patient spikes a fever will panculture, otherwise monitor closely.     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  Hypomagnesmia - Magnesium IV 2 g    Goals of care - Ambulate patient, all shades and windows blinds open.    DVT prophylaxis: SCD Code Status: Full Family Communication: None Disposition Plan: TBD   Consultants:  PCCM    Procedures/Significant Events:     I have personally reviewed and interpreted all radiology studies and my findings are as above.  VENTILATOR SETTINGS:    Cultures 7/2 MRSA by PCR positive 7/5 SARS coronavirus negative     Antimicrobials: Anti-infectives (  From admission, onward)   Start     Stop   06/26/19 1400  cefTRIAXone (ROCEPHIN) 1 g in sodium chloride 0.9 % 100 mL IVPB  Status:  Discontinued     06/29/19 1141        Devices    LINES / TUBES:      Continuous Infusions:    Objective: Vitals:   07/02/19 1922 07/02/19 2343 07/03/19 0310 07/03/19 0634  BP: (!) 169/85 (!) 151/76 (!) 166/84   Pulse: 86 89 94   Resp: 16 20 (!) 21   Temp: 97.9 F (36.6 C) 99.1 F (37.3 C) 98.3 F (36.8 C)   TempSrc: Oral Axillary Oral   SpO2: 96% 96% 95%   Weight:    122.2 kg  Height: 5\' 2"  (1.575 m)       Intake/Output Summary (Last 24 hours) at 07/03/2019 0743 Last data filed at 07/03/2019 [id number] Gross per 24 hour  Intake 250 ml  Output 300 ml  Net -50 ml   Filed Weights   07/01/19 0500 07/02/19 0600 07/03/19 0634  Weight: 124.8 kg 109 kg 122.2 kg   Physical Exam:  General: A/O x4, no acute respiratory distress Eyes: negative scleral hemorrhage, negative anisocoria, negative icterus ENT: Negative Runny nose, negative gingival bleeding, Neck:  Negative scars, masses, torticollis, lymphadenopathy, JVD Lungs: Clear to auscultation bilaterally without wheezes or crackles Cardiovascular: Regular rate and rhythm without murmur gallop or rub normal S1 and S2 Abdomen: MORBIDLY OBESE negative abdominal pain, nondistended, positive soft, bowel sounds, no rebound, no ascites, no appreciable mass Extremities: No significant cyanosis, clubbing, or edema bilateral lower extremities Skin: Negative rashes, lesions, ulcers Psychiatric:  Negative depression, negative anxiety, negative fatigue, negative mania, poor understanding of medical problems. Central nervous system:  Cranial nerves II through XII intact, tongue/uvula midline, all extremities muscle strength 5/5, sensation intact throughout, negative dysarthria, negative expressive aphasia, negative receptive aphasia.       Data Reviewed: Care during the described time interval was provided by me .  I have reviewed this patient's available data, including medical history, events of note, physical examination, and all test results as part of my evaluation.    CBC: Recent Labs  Lab 06/28/19 0634 06/29/19 0826 06/30/19 0755 07/01/19 0226 07/02/19 0827 07/03/19 0312  WBC 18.1* 14.9* 13.8* 16.4* 16.1* 15.7*  NEUTROABS 16.3* 12.8*  --  13.9*  --   --   HGB 11.5* 12.0 12.3 12.2 12.8 12.5  HCT 31.8* 34.2* 35.6* 35.3* 37.1 36.3  MCV 97.5 101.5* 102.3* 103.2* 103.9* 103.4*  PLT 29* 37* 48* 55* 63* 60*   Basic Metabolic Panel: Recent Labs  Lab 06/28/19 0531  06/30/19 0755 07/01/19 0226 07/01/19 1515 07/02/19 0827 07/03/19 0312  NA 119*   < > 130* 129* 131* 128* 132*  K 5.0   < > 4.8 5.0 5.0 4.7 4.9  CL 92*   < > 96* 95* 97* 91* 95*  CO2 17*   < > 26 26 28 29 29   GLUCOSE 133*   < > 112* 111* 115* 135* 99  BUN 41*   < > 20 14 13 10 8   CREATININE 0.85   < > 0.64 0.49 0.46 0.54 0.44  CALCIUM 7.5*   < > 8.4* 8.4* 8.3* 8.3* 8.3*  MG 1.7  --   --   --   --  1.3* 1.5*  PHOS 3.5  --   --   --   --   --   --    < > =  values in this interval not displayed.   GFR: Estimated Creatinine Clearance: 106 mL/min (by C-G formula based on SCr of 0.44 mg/dL). Liver Function Tests: Recent Labs  Lab 06/27/19 0446 06/28/19 0531 06/29/19 0826 06/30/19 0755 07/03/19 0312  AST 217* 146* 161* 151* 88*  ALT 55* 47* 74* 83* 73*  ALKPHOS 159* 150* 167* 174* 145*  BILITOT 7.2* 6.2* 6.6* 6.9* 4.8*  PROT 5.9* 5.8* 6.3* 6.1* 6.2*  ALBUMIN 2.0* 1.9* 2.0* 2.0* 1.9*   No results for input(s): LIPASE, AMYLASE in the last 168 hours. Recent Labs  Lab 06/26/19 1120 07/02/19 1518  AMMONIA 28 52*   Coagulation Profile: Recent Labs  Lab 06/26/19 1120 06/27/19 0446 06/28/19 0634 06/29/19 0826  INR 1.9* 1.8* 1.7* 1.5*   Cardiac Enzymes: Recent Labs  Lab 06/26/19 1120 06/26/19 1805 06/27/19 0446 07/03/19 0312  CKTOTAL 2,140* 1,129* 452* 29*   BNP (last 3 results) No results for input(s): PROBNP in the last 8760 hours. HbA1C: No results for input(s): HGBA1C in the last 72 hours. CBG: Recent Labs  Lab 07/02/19 1124 07/02/19 1606 07/02/19 2010  07/02/19 2349 07/03/19 0310  GLUCAP 111* 125* 111* 110* 97   Lipid Profile: No results for input(s): CHOL, HDL, LDLCALC, TRIG, CHOLHDL, LDLDIRECT in the last 72 hours. Thyroid Function Tests: No results for input(s): TSH, T4TOTAL, FREET4, T3FREE, THYROIDAB in the last 72 hours. Anemia Panel: No results for input(s): VITAMINB12, FOLATE, FERRITIN, TIBC, IRON, RETICCTPCT in the last 72 hours. Urine analysis:    Component Value Date/Time   COLORURINE AMBER (A) 06/26/2019 1444   APPEARANCEUR CLOUDY (A) 06/26/2019 1444   LABSPEC 1.014 06/26/2019 1444   PHURINE 5.0 06/26/2019 1444   GLUCOSEU NEGATIVE 06/26/2019 1444   HGBUR MODERATE (A) 06/26/2019 1444   BILIRUBINUR NEGATIVE 06/26/2019 1444   KETONESUR NEGATIVE 06/26/2019 1444   PROTEINUR 100 (A) 06/26/2019 1444   UROBILINOGEN 0.2 01/30/2015 2338   NITRITE NEGATIVE 06/26/2019 1444   LEUKOCYTESUR MODERATE (A) 06/26/2019 1444   Sepsis Labs: @LABRCNTIP (procalcitonin:4,lacticidven:4)  ) Recent Results (from the past 240 hour(s))  MRSA PCR Screening     Status: Abnormal   Collection Time: 06/26/19  2:44 PM   Specimen: Nasopharyngeal  Result Value Ref Range Status   MRSA by PCR POSITIVE (A) NEGATIVE Final    Comment:        The GeneXpert MRSA Assay (FDA approved for NASAL specimens only), is one component of a comprehensive MRSA colonization surveillance program. It is not intended to diagnose MRSA infection nor to guide or monitor treatment for MRSA infections. RESULT CALLED TO, READ BACK BY AND VERIFIED WITH: Claud KelpE. Curry RN 16:30 06/26/19 ([email or web address]) Performed at [facility], [address] [place], [place] [address]   SARS Coronavirus 2 (CEPHEID - Performed in [facility] hospital lab), Hosp Order     Status: None   Collection Time: 06/29/19  9:39 AM   Specimen: Nasopharyngeal Swab  Result Value Ref Range Status   SARS Coronavirus 2 NEGATIVE NEGATIVE Final    Comment: (NOTE) If result is NEGATIVE SARS-CoV-2 target  nucleic acids are NOT DETECTED. The SARS-CoV-2 RNA is generally detectable in upper and lower  respiratory specimens during the acute phase of infection. The lowest  concentration of SARS-CoV-2 viral copies this assay can detect is 250  copies / mL. A negative result does not preclude SARS-CoV-2 infection  and should not be used as the sole basis for treatment or other  patient management decisions.  A negative result may occur  with  improper specimen collection / handling, submission of specimen other  than nasopharyngeal swab, presence of viral mutation(s) within the  areas targeted by this assay, and inadequate number of viral copies  (<250 copies / mL). A negative result must be combined with clinical  observations, patient history, and epidemiological information. If result is POSITIVE SARS-CoV-2 target nucleic acids are DETECTED. The SARS-CoV-2 RNA is generally detectable in upper and lower  respiratory specimens dur ing the acute phase of infection.  Positive  results are indicative of active infection with SARS-CoV-2.  Clinical  correlation with patient history and other diagnostic information is  necessary to determine patient infection status.  Positive results do  not rule out bacterial infection or co-infection with other viruses. If result is PRESUMPTIVE POSTIVE SARS-CoV-2 nucleic acids MAY BE PRESENT.   A presumptive positive result was obtained on the submitted specimen  and confirmed on repeat testing.  While 2019 novel coronavirus  (SARS-CoV-2) nucleic acids may be present in the submitted sample  additional confirmatory testing may be necessary for epidemiological  and / or clinical management purposes  to differentiate between  SARS-CoV-2 and other Sarbecovirus currently known to infect humans.  If clinically indicated additional testing with an alternate test  methodology [phone]([id number]) is advised. The SARS-CoV-2 RNA is generally  detectable in upper and lower  respiratory sp ecimens during the acute  phase of infection. The expected result is Negative. Fact Sheet for Patients:  BoilerBrush.com.cyhttps://www.fda.gov/media/136312/download Fact Sheet for Healthcare Providers: https://pope.com/https://www.fda.gov/media/136313/download This test is not yet approved or cleared by the [place] FDA and has been authorized for detection and/or diagnosis of SARS-CoV-2 by FDA under an Emergency Use Authorization (EUA).  This EUA will remain in effect (meaning this test can be used) for the duration of the COVID-19 declaration under Section 564(b)(1) of the Act, 21 U.S.C. section 360bbb-3(b)(1), unless the authorization is terminated or revoked sooner. Performed at [facility], [address] [place], [place] [address]          Radiology Studies: No results found.      Scheduled Meds: . folic acid  1 mg Oral Daily  . insulin aspart  0-24 Units Subcutaneous Q4H  . lisinopril  20 mg Oral Daily  . sodium chloride  1 g Oral TID WC  . thiamine  100 mg Oral Daily   Continuous Infusions:    LOS: 7 days   The patient is critically ill with multiple organ systems failure and requires high complexity decision making for assessment and support, frequent evaluation and titration of therapies, application of advanced monitoring technologies and extensive interpretation of multiple databases. Critical Care Time devoted to patient care services described in this note  Time spent: 40 minutes     Verland Sprinkle, Roselind MessierURTIS J, MD [facility] Pager [phone]  If 7PM-7AM, please contact night-coverage [email or web address] Password [email or web address] 07/03/2019, 7:43 AM

## 2019-07-03 NOTE — Progress Notes (Signed)
Patient refused cpap use for the evening. Will continue to monitor patient.

## 2019-07-04 DIAGNOSIS — J9601 Acute respiratory failure with hypoxia: Secondary | ICD-10-CM

## 2019-07-04 DIAGNOSIS — F1010 Alcohol abuse, uncomplicated: Secondary | ICD-10-CM

## 2019-07-04 DIAGNOSIS — G9341 Metabolic encephalopathy: Secondary | ICD-10-CM

## 2019-07-04 LAB — CBC
HCT: 32.7 % — ABNORMAL LOW (ref 36.0–46.0)
Hemoglobin: 10.9 g/dL — ABNORMAL LOW (ref 12.0–15.0)
MCH: 34.8 pg — ABNORMAL HIGH (ref 26.0–34.0)
MCHC: 33.3 g/dL (ref 30.0–36.0)
MCV: 104.5 fL — ABNORMAL HIGH (ref 80.0–100.0)
Platelets: 69 10*3/uL — ABNORMAL LOW (ref 150–400)
RBC: 3.13 MIL/uL — ABNORMAL LOW (ref 3.87–5.11)
RDW: 16.3 % — ABNORMAL HIGH (ref 11.5–15.5)
WBC: 19 10*3/uL — ABNORMAL HIGH (ref 4.0–10.5)
nRBC: 0 % (ref 0.0–0.2)

## 2019-07-04 LAB — CK: Total CK: 20 U/L — ABNORMAL LOW (ref 38–234)

## 2019-07-04 LAB — BASIC METABOLIC PANEL
Anion gap: 7 (ref 5–15)
BUN: 10 mg/dL (ref 6–20)
CO2: 29 mmol/L (ref 22–32)
Calcium: 8.1 mg/dL — ABNORMAL LOW (ref 8.9–10.3)
Chloride: 95 mmol/L — ABNORMAL LOW (ref 98–111)
Creatinine, Ser: 0.54 mg/dL (ref 0.44–1.00)
GFR calc Af Amer: >60 mL/min (ref >60)
GFR calc non Af Amer: >60 mL/min (ref >60)
Glucose, Bld: 107 mg/dL — ABNORMAL HIGH (ref 70–99)
Potassium: 4.4 mmol/L (ref 3.5–5.1)
Sodium: 131 mmol/L — ABNORMAL LOW (ref 135–145)

## 2019-07-04 LAB — AMMONIA: Ammonia: 44 umol/L — ABNORMAL HIGH (ref 9–35)

## 2019-07-04 LAB — GLUCOSE, CAPILLARY
Glucose-Capillary: 93 mg/dL (ref 70–99)
Glucose-Capillary: 91 mg/dL (ref 70–99)

## 2019-07-04 LAB — MAGNESIUM
Magnesium: 1.5 mg/dL — ABNORMAL LOW (ref 1.7–2.4)
Magnesium: 1.8 mg/dL (ref 1.7–2.4)

## 2019-07-04 MED ORDER — MAGNESIUM SULFATE 2 GM/50ML IV SOLN
2.0000 g | Freq: Once | INTRAVENOUS | Status: AC
  Administered 2019-07-04: 2 g via INTRAVENOUS
  Filled 2019-07-04: qty 50

## 2019-07-04 MED ORDER — METOPROLOL TARTRATE 25 MG PO TABS: 12.5000 mg | ORAL_TABLET | Freq: Two times a day (BID) | ORAL | 0 refills | Status: DC

## 2019-07-04 MED ORDER — MAGNESIUM SULFATE 2 GM/50ML IV SOLN: 2.0000 g | Freq: Once | INTRAVENOUS | Status: DC

## 2019-07-04 MED ORDER — MAGNESIUM OXIDE 250 MG PO TABS: 250.0000 mg | ORAL_TABLET | Freq: Every day | ORAL | 0 refills | Status: AC

## 2019-07-04 MED ORDER — SODIUM CHLORIDE 0.9 % IV SOLN
INTRAVENOUS | Status: DC | PRN
  Administered 2019-07-04: 250 mL via INTRAVENOUS

## 2019-07-04 NOTE — Discharge Summary (Signed)
Physician Discharge Summary  SHAYRA ANTON AVW:[id number] DOB: [birth year] DOA: 06/26/2019  PCP: Patient, No Pcp Per  Admit date: 06/26/2019 Discharge date: 07/04/2019  Admitted From: Home Disposition: Home  Recommendations for Outpatient Follow-up:  1. Follow up with PCP in 1-2 weeks 2. For repeat magnesium in 1 week 3. Please obtain BMP/CBC in one week 4. Please follow up on the following pending results:  Home Health: Yes Equipment/Devices: 3 in 1 bedside commode  Discharge Condition: Stable CODE STATUS: Full code Diet recommendation: Heart healthy  Subjective: Patient seen and examined.  She has no complaints.  She is willing and ready to go home.  Brief/Interim Summary: 48 year old lady with history of hypertension, chronic alcoholism, depression and anxiety presented to the emergency department on 7/2 after EMS called out for a stroke symptoms. Patient was found to be on the floor for 3 hours. She was reported to have decreased oral intake, diarrhea on and also found to have hypoglycemia. Work-up in the emergency department showed severe hyponatremia of 108, mild hyperglycemia, AKI with creatinine of 3.16, rhabdomyolysis. There was concern for ethanol withdrawal. Patient was admitted under PCCM service for the management of severe hyponatremia.  She received DDAVP as well as salt tablets.  Her Celexa and trazodone were held.  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 Patient sodium has improved and is 131 today.  Of note, patient also had slightly elevated blood pressure for which her lisinopril was resumed from home and metoprolol was added.  Patient's altered mental status/acute metabolic encephalopathy also improved with correction of her sodium.  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  She is  completely alert and oriented today.  She also had mild withdrawal symptoms for which she received some as needed Ativan here and there.  She also had some hypomagnesemia for which she received replacement for that as well.  She was evaluated by PT OT and they recommended home health which has been arranged for the patient so patient will be discharged today in stable condition and in agreement with the patient.  Discharge Diagnoses:  Active Problems:   Hypertension   Hyperlipidemia   Alcohol withdrawal syndrome (HCC)   Hyponatremia   Alcohol abuse   Depression   Anxiety   Rhabdomyolysis   Acute metabolic encephalopathy   Acute respiratory failure with hypoxia [facility])    Discharge Instructions  Discharge Instructions    Discharge patient   Complete by: As directed    Discharge disposition: 06-Home-Health Care Svc   Discharge patient date: 07/04/2019     Allergies as of 07/04/2019   No Known Allergies     Medication List    STOP taking these medications   hydrOXYzine 25 MG tablet Commonly known as: ATARAX/VISTARIL   ibuprofen 200 MG tablet Commonly known as: ADVIL     TAKE these medications   ALPRAZolam 0.5 MG tablet Commonly known as: XANAX Take 0.5 mg by mouth 2 (two) times daily as needed for anxiety.   citalopram 40 MG tablet Commonly known as: CELEXA Take 1 tablet (40 mg total) by mouth daily.   diclofenac 75 MG EC tablet Commonly known as: VOLTAREN Take 75 mg by mouth daily as needed (pain).   folic acid 1 MG tablet Commonly known as: FOLVITE Take 1 tablet (1 mg total) by mouth daily.   gabapentin  100 MG capsule Commonly known as: Neurontin Take 1 capsule (100 mg total) by mouth 3 (three) times daily.   lisinopril 20 MG tablet Commonly known as: ZESTRIL Take 20 mg by mouth daily.   Magnesium Oxide 250 MG Tabs Take 1 tablet (250 mg total) by mouth daily.   metoprolol tartrate 25 MG tablet Commonly known as: LOPRESSOR Take 0.5 tablets (12.5 mg  total) by mouth 2 (two) times daily.   multivitamin with minerals Tabs tablet Take 1 tablet by mouth daily.   POTASSIUM PO Take 1 tablet by mouth daily.   traZODone 100 MG tablet Commonly known as: DESYREL Take 100 mg by mouth at bedtime as needed for sleep.       No Known Allergies  Consultations: PCCM   Procedures/Studies: Dg Chest 1 View  Result Date: 06/29/2019 CLINICAL DATA:  Hyponatremia, shortness of breath EXAM: CHEST  1 VIEW COMPARISON:  06/26/2019, 06/28/2019 FINDINGS: Stable cardiomegaly with vascular congestion centrally. No focal pneumonia, collapse or consolidation. Negative for effusion or pneumothorax. Trachea midline. No acute osseous finding. IMPRESSION: Stable cardiomegaly with vascular congestion.  No interval change. Electronically Signed   By: Jerilynn Mages.  Shick M.D.   On: 06/29/2019 13:18   Dg Chest 1 View  Result Date: 06/26/2019 CLINICAL DATA:  Shortness of Breath EXAM: CHEST  1 VIEW COMPARISON:  06/26/2019 FINDINGS: Cardiac shadow is mildly prominent but stable. Central vascular congestion has improved slightly. No focal infiltrate or effusion is noted. No bony abnormality is noted. IMPRESSION: Slight improvement in central vascular congestion. Electronically Signed   By: Inez Catalina M.D.   On: 06/26/2019 12:18   Dg Chest Port 1 View  Result Date: 06/28/2019 CLINICAL DATA:  Respiratory failure. EXAM: PORTABLE CHEST 1 VIEW COMPARISON:  Radiograph June 26, 2019. FINDINGS: Stable cardiomegaly and central pulmonary vascular congestion. No pneumothorax or pleural effusion is noted. No consolidative process is noted. Bony thorax is unremarkable. IMPRESSION: Stable cardiomegaly and central pulmonary vascular congestion. Electronically Signed   By: Marijo Conception M.D.   On: 06/28/2019 08:40   Dg Shoulder Left Port  Result Date: 06/28/2019 CLINICAL DATA:  Acute onset of left shoulder pain. EXAM: LEFT SHOULDER - 1 VIEW COMPARISON:  01/28/2017 FINDINGS: Chronic osteoarthritis of  the left shoulder with osteophyte formation. No evidence of fracture or dislocation. Degenerative change affects the [facility] joint. 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 Electronically Signed   By: Nelson Chimes M.D.   On: 06/28/2019 10:55   US Abdomen Limited Ruq  Result Date: 06/29/2019 CLINICAL DATA:  Cirrhosis. EXAM: ULTRASOUND ABDOMEN LIMITED RIGHT UPPER QUADRANT COMPARISON:  CT 06/24/2017 FINDINGS: Gallbladder: Physiologically distended. No gallstones or wall thickening visualized. No sonographic Murphy sign noted by sonographer. Common bile duct: Diameter: 2 mm, normal. Liver: Heterogeneous increased hepatic parenchyma with nodular contours consistent with cirrhosis. No obvious focal lesion. Portal vein is patent on color Doppler imaging with normal direction of blood flow towards the liver. No right upper quadrant ascites. Technically difficult exam due to patient body habitus. IMPRESSION: 1. Hepatic cirrhosis. No focal liver lesion, however technically limited exam due to habitus. No right upper quadrant ascites. 2. No gallstones or biliary dilatation. Electronically Signed   By: Keith Rake M.D.   On: 06/29/2019 19:38      Discharge Exam: Vitals:   07/04/19 0717 07/04/19 1022  BP: (!) 153/71 137/71  Pulse:  73  Resp:    Temp: 98.3 F (36.8 C) 98.7 F (37.1 C)  SpO2:  95%   Vitals:   07/04/19 0350 07/04/19 0414 07/04/19 0717 07/04/19 1022  BP: (!) 156/84  (!) 153/71 137/71  Pulse: 82   73  Resp: 20     Temp: 98.2 F (36.8 C)  98.3 F (36.8 C) 98.7 F (37.1 C)  TempSrc: Oral  Oral Oral  SpO2: 95%   95%  Weight:  122.7 kg    Height:        General: Pt is alert, awake, not in acute distress Cardiovascular: RRR, S1/S2 +, no rubs, no gallops Respiratory: CTA bilaterally, no wheezing, no rhonchi Abdominal: Soft, NT, ND, bowel sounds + Extremities: no edema, no cyanosis    The results of significant diagnostics from this  hospitalization (including imaging, microbiology, ancillary and laboratory) are listed below for reference.     Microbiology: Recent Results (from the past 240 hour(s))  MRSA PCR Screening     Status: Abnormal   Collection Time: 06/26/19  2:44 PM   Specimen: Nasopharyngeal  Result Value Ref Range Status   MRSA by PCR POSITIVE (A) NEGATIVE Final    Comment:        The GeneXpert MRSA Assay (FDA approved for NASAL specimens only), is one component of a comprehensive MRSA colonization surveillance program. It is not intended to diagnose MRSA infection nor to guide or monitor treatment for MRSA infections. RESULT CALLED TO, READ BACK BY AND VERIFIED WITH: Claud KelpE. Curry RN 16:30 06/26/19 ([email or web address]) Performed at [facility], [address] [place], [place] [address]   SARS Coronavirus 2 (CEPHEID - Performed in [facility] hospital lab), Hosp Order     Status: None   Collection Time: 06/29/19  9:39 AM   Specimen: Nasopharyngeal Swab  Result Value Ref Range Status   SARS Coronavirus 2 NEGATIVE NEGATIVE Final    Comment: (NOTE) If result is NEGATIVE SARS-CoV-2 target nucleic acids are NOT DETECTED. The SARS-CoV-2 RNA is generally detectable in upper and lower  respiratory specimens during the acute phase of infection. The lowest  concentration of SARS-CoV-2 viral copies this assay can detect is 250  copies / mL. A negative result does not preclude SARS-CoV-2 infection  and should not be used as the sole basis for treatment or other  patient management decisions.  A negative result may occur with  improper specimen collection / handling, submission of specimen other  than nasopharyngeal swab, presence of viral mutation(s) within the  areas targeted by this assay, and inadequate number of viral copies  (<250 copies / mL). A negative result must be combined with clinical  observations, patient history, and epidemiological information. If result is POSITIVE SARS-CoV-2 target nucleic  acids are DETECTED. The SARS-CoV-2 RNA is generally detectable in upper and lower  respiratory specimens dur ing the acute phase of infection.  Positive  results are indicative of active infection with SARS-CoV-2.  Clinical  correlation with patient history and other diagnostic information is  necessary to determine patient infection status.  Positive results do  not rule out bacterial infection or co-infection with other viruses. If result is PRESUMPTIVE POSTIVE SARS-CoV-2 nucleic acids MAY BE PRESENT.   A presumptive positive result was obtained on the submitted specimen  and confirmed on repeat testing.  While 2019 novel coronavirus  (SARS-CoV-2) nucleic acids may be present in the submitted sample  additional confirmatory testing may be necessary for epidemiological  and / or clinical management purposes  to differentiate between  SARS-CoV-2 and other Sarbecovirus currently known to infect humans.  If clinically indicated additional testing with an alternate test  methodology [phone]) is advised. The SARS-CoV-2 RNA is generally  detectable in upper and lower respiratory sp ecimens during the acute  phase of infection. The expected result is Negative. Fact Sheet for Patients:  BoilerBrush.com.cy Fact Sheet for Healthcare Providers: https://pope.com/ This test is not yet approved or cleared by the [place] FDA and has been authorized for detection and/or diagnosis of SARS-CoV-2 by FDA under an Emergency Use Authorization (EUA).  This EUA will remain in effect (meaning this test can be used) for the duration of the COVID-19 declaration under Section 564(b)(1) of the Act, 21 U.S.C. section 360bbb-3(b)(1), unless the authorization is terminated or revoked sooner. Performed at [facility], [address] [place] [address], [place], [place] [address]      Labs: BNP (last 3 results) No results for input(s): BNP in the last 8760  hours. Basic Metabolic Panel: Recent Labs  Lab 06/28/19 0531  07/01/19 0226 07/01/19 1515 07/02/19 0827 07/03/19 0312 07/04/19 0212 07/04/19 0952  NA 119*   < > 129* 131* 128* 132* 131*  --   K 5.0   < > 5.0 5.0 4.7 4.9 4.4  --   CL 92*   < > 95* 97* 91* 95* 95*  --   CO2 17*   < > --   GLUCOSE 133*   < > 111* 115* 135* 99 107*  --   BUN 41*   < > --   CREATININE 0.85   < > 0.49 0.46 0.54 0.44 0.54  --   CALCIUM 7.5*   < > 8.4* 8.3* 8.3* 8.3* 8.1*  --   MG 1.7  --   --   --  1.3* 1.5* 1.5* 1.8  PHOS 3.5  --   --   --   --   --   --   --    < > = values in this interval not displayed.   Liver Function Tests: Recent Labs  Lab 06/28/19 0531 06/29/19 0826 06/30/19 0755 07/03/19 0312  AST 146* 161* 151* 88*  ALT 47* 74* 83* 73*  ALKPHOS 150* 167* 174* 145*  BILITOT 6.2* 6.6* 6.9* 4.8*  PROT 5.8* 6.3* 6.1* 6.2*  ALBUMIN 1.9* 2.0* 2.0* 1.9*   No results for input(s): LIPASE, AMYLASE in the last 168 hours. Recent Labs  Lab 07/02/19 1518 07/04/19 0212  AMMONIA 52* 44*   CBC: Recent Labs  Lab 06/28/19 0634 06/29/19 0826 06/30/19 0755 07/01/19 0226 07/02/19 0827 07/03/19 0312 07/04/19 0212  WBC 18.1* 14.9* 13.8* 16.4* 16.1* 15.7* 19.0*  NEUTROABS 16.3* 12.8*  --  13.9*  --   --   --   HGB 11.5* 12.0 12.3 12.2 12.8 12.5 10.9*  HCT 31.8* 34.2* 35.6* 35.3* 37.1 36.3 32.7*  MCV 97.5 101.5* 102.3* 103.2* 103.9* 103.4* 104.5*  PLT 29* 37* 48* 55* 63* 60* 69*   Cardiac Enzymes: Recent Labs  Lab 07/03/19 0312 07/04/19 0212  CKTOTAL 29* 20*   BNP: Invalid input(s): POCBNP CBG: Recent Labs  Lab 07/03/19 1634 07/03/19 1945 07/03/19 2313 07/04/19 0403 07/04/19 1116  GLUCAP 84 136* 115* 93 91   D-Dimer No results for input(s): DDIMER in the last 72 hours. Hgb A1c No results for input(s): HGBA1C in the last 72 hours. Lipid Profile No results for input(s): CHOL, HDL, LDLCALC, TRIG, CHOLHDL, LDLDIRECT in the last 72  hours. Thyroid function studies No  results for input(s): TSH, T4TOTAL, T3FREE, THYROIDAB in the last 72 hours.  Invalid input(s): FREET3 Anemia work up No results for input(s): VITAMINB12, FOLATE, FERRITIN, TIBC, IRON, RETICCTPCT in the last 72 hours. Urinalysis    Component Value Date/Time   COLORURINE AMBER (A) 06/26/2019 1444   APPEARANCEUR CLOUDY (A) 06/26/2019 1444   LABSPEC 1.014 06/26/2019 1444   PHURINE 5.0 06/26/2019 1444   GLUCOSEU NEGATIVE 06/26/2019 1444   HGBUR MODERATE (A) 06/26/2019 1444   BILIRUBINUR NEGATIVE 06/26/2019 1444   KETONESUR NEGATIVE 06/26/2019 1444   PROTEINUR 100 (A) 06/26/2019 1444   UROBILINOGEN 0.2 01/30/2015 2338   NITRITE NEGATIVE 06/26/2019 1444   LEUKOCYTESUR MODERATE (A) 06/26/2019 1444   Sepsis Labs Invalid input(s): PROCALCITONIN,  WBC,  LACTICIDVEN Microbiology Recent Results (from the past 240 hour(s))  MRSA PCR Screening     Status: Abnormal   Collection Time: 06/26/19  2:44 PM   Specimen: Nasopharyngeal  Result Value Ref Range Status   MRSA by PCR POSITIVE (A) NEGATIVE Final    Comment:        The GeneXpert MRSA Assay (FDA approved for NASAL specimens only), is one component of a comprehensive MRSA colonization surveillance program. It is not intended to diagnose MRSA infection nor to guide or monitor treatment for MRSA infections. RESULT CALLED TO, READ BACK BY AND VERIFIED WITH: Claud KelpE. Curry RN 16:30 06/26/19 ([email or web address]) Performed at [facility], [address] [place], [place] [address]   SARS Coronavirus 2 (CEPHEID - Performed in [facility] hospital lab), Hosp Order     Status: None   Collection Time: 06/29/19  9:39 AM   Specimen: Nasopharyngeal Swab  Result Value Ref Range Status   SARS Coronavirus 2 NEGATIVE NEGATIVE Final    Comment: (NOTE) If result is NEGATIVE SARS-CoV-2 target nucleic acids are NOT DETECTED. The SARS-CoV-2 RNA is generally detectable in upper and lower  respiratory specimens during the acute  phase of infection. The lowest  concentration of SARS-CoV-2 viral copies this assay can detect is 250  copies / mL. A negative result does not preclude SARS-CoV-2 infection  and should not be used as the sole basis for treatment or other  patient management decisions.  A negative result may occur with  improper specimen collection / handling, submission of specimen other  than nasopharyngeal swab, presence of viral mutation(s) within the  areas targeted by this assay, and inadequate number of viral copies  (<250 copies / mL). A negative result must be combined with clinical  observations, patient history, and epidemiological information. If result is POSITIVE SARS-CoV-2 target nucleic acids are DETECTED. The SARS-CoV-2 RNA is generally detectable in upper and lower  respiratory specimens dur ing the acute phase of infection.  Positive  results are indicative of active infection with SARS-CoV-2.  Clinical  correlation with patient history and other diagnostic information is  necessary to determine patient infection status.  Positive results do  not rule out bacterial infection or co-infection with other viruses. If result is PRESUMPTIVE POSTIVE SARS-CoV-2 nucleic acids MAY BE PRESENT.   A presumptive positive result was obtained on the submitted specimen  and confirmed on repeat testing.  While 2019 novel coronavirus  (SARS-CoV-2) nucleic acids may be present in the submitted sample  additional confirmatory testing may be necessary for epidemiological  and / or clinical management purposes  to differentiate between  SARS-CoV-2 and other Sarbecovirus currently known to infect humans.  If clinically indicated additional testing with an alternate test  methodology [phone]([id number])  is advised. The SARS-CoV-2 RNA is generally  detectable in upper and lower respiratory sp ecimens during the acute  phase of infection. The expected result is Negative. Fact Sheet for Patients:   BoilerBrush.com.cyhttps://www.fda.gov/media/136312/download Fact Sheet for Healthcare Providers: https://pope.com/https://www.fda.gov/media/136313/download This test is not yet approved or cleared by the [place] FDA and has been authorized for detection and/or diagnosis of SARS-CoV-2 by FDA under an Emergency Use Authorization (EUA).  This EUA will remain in effect (meaning this test can be used) for the duration of the COVID-19 declaration under Section 564(b)(1) of the Act, 21 U.S.C. section 360bbb-3(b)(1), unless the authorization is terminated or revoked sooner. Performed at [facility], [address] [place], [place] [address]      Time coordinating discharge: Over 30 minutes  SIGNED:   Hughie Clossavi Rondrick Barreira, MD  [facility] 07/04/2019, 11:29 AM Pager [phone]  If 7PM-7AM, please contact night-coverage [email or web address] Password [email or web address]

## 2019-07-04 NOTE — Progress Notes (Signed)
Patient continues to await transport home.  CM has been involved, seeking authority from administration and assistance from [employer] to provide voucher to transport to [place].  Await Lorriane Shire, CSW

## 2019-07-04 NOTE — Discharge Instructions (Signed)
Hyponatremia Hyponatremia is when the amount of salt (sodium) in your blood is too low. When salt levels are low, your body may take in extra water. This can cause swelling throughout the body. The swelling often affects the brain. What are the causes? This condition may be caused by:  Certain medical problems or conditions.  Vomiting a lot.  Having watery poop (diarrhea) often.  Certain medicines or illegal drugs.  Not having enough water in the body (dehydration).  Drinking too much water.  Eating a diet that is low in salt.  Large burns on your body.  Too much sweating. What increases the risk? You are more likely to get this condition if you:  Have long-term (chronic) kidney disease.  Have heart failure.  Have a medical condition that causes you to have watery poop often.  Do very hard exercises.  Take medicines that affect the amount of salt is in your blood. What are the signs or symptoms? Symptoms of this condition include:  Headache.  Feeling like you may vomit (nausea).  Vomiting.  Being very tired (lethargic).  Muscle weakness and cramps.  Not wanting to eat as much as normal (loss of appetite).  Feeling weak or light-headed. Severe symptoms of this condition include:  Confusion.  Feeling restless (agitation).  Having a fast heart rate.  Passing out (fainting).  Seizures.  Coma. How is this treated? Treatment for this condition depends on the cause. Treatment may include:  Getting fluids through an IV tube that is put into one of your veins.  Taking medicines to fix the salt levels in your blood. If medicines are causing the problem, your medicines will need to be changed.  Limiting how much water or fluid you take in.  Monitoring in the hospital to watch your symptoms. Follow these instructions at home:   Take over-the-counter and prescription medicines only as told by your doctor. Many medicines can make this condition worse.  Talk with your doctor about any medicines that you are taking.  Eat and drink exactly as you are told by your doctor. ? Eat only the foods you are told to eat. ? Limit how much fluid you take.  Do not drink alcohol.  Keep all follow-up visits as told by your doctor. This is important. Contact a doctor if:  You feel more like you may vomit.  You feel more tired.  Your headache gets worse.  You feel more confused.  You feel weaker.  Your symptoms go away and then they come back.  You have trouble following the diet instructions. Get help right away if:  You have a seizure.  You pass out.  You keep having watery poop.  You keep vomiting. Summary  Hyponatremia is when the amount of salt in your blood is too low.  When salt levels are low, you can have swelling throughout the body. The swelling mostly affects the brain.  Treatment depends on the cause. Treatment may include getting IV fluids, medicines, or not drinking as much fluid. This information is not intended to replace advice given to you by your health care provider. Make sure you discuss any questions you have with your health care provider. Document Released: 08/23/2011 Document Revised: 02/27/2019 Document Reviewed: 11/14/2018 Elsevier Patient Education  2020 [employer]  

## 2019-07-04 NOTE — Progress Notes (Signed)
Occupational Therapy Treatment Patient Details Name: Tiffany Howell MRN: [id number] DOB: 08-29-1971 Today's Date: 07/04/2019    History of present illness 48 y/o F who presented to [facility] on 7/2 after EMS called out for stroke symptoms.  Pt had reportedly been on the floor x3 hours, decreased PO intake, diarrhea, hypoglycemia.  Work up notable for severe hyponatremia, hypoglycemia, AKI / rhabdomyolysis.  Additional concern for ETOH withdrawal.    OT comments  Pt is making good progress. She was able to dress self with set up assist to light min A to pull pants over her pannus.  She was very animated today, and was able to recall info RN had previously provided as well as rationale for medical treatments.  Recommend HHOT.   Follow Up Recommendations  Home health OT;Supervision/Assistance - 24 hour    Equipment Recommendations  3 in 1 bedside commode    Recommendations for Other Services      Precautions / Restrictions Precautions Precautions: Fall Restrictions Weight Bearing Restrictions: No       Mobility Bed Mobility               General bed mobility comments: Pt sitting up in chair   Transfers Overall transfer level: Needs assistance Equipment used: None Transfers: Sit to/from Stand Sit to Stand: Supervision              Balance Overall balance assessment: Needs assistance Sitting-balance support: Feet supported;No upper extremity supported Sitting balance-Leahy Scale: Good     Standing balance support: During functional activity;No upper extremity supported Standing balance-Leahy Scale: Fair Standing balance comment: able to maintain static standing with supervision during LB ADLs                            ADL either performed or assessed with clinical judgement   ADL Overall ADL's : Needs assistance/impaired Eating/Feeding: Independent   Grooming: Wash/dry hands;Wash/dry face;Oral care;Brushing hair;Supervision/safety;Standing    Upper Body Bathing: Set up;Sitting   Lower Body Bathing: Supervison/ safety;Sit to/from stand   Upper Body Dressing : Set up;Sitting   Lower Body Dressing: Minimal assistance;Sit to/from stand Lower Body Dressing Details (indicate cue type and reason): assist to pull pants over pannus due to pants being too small              Functional mobility during ADLs: Supervision/safety       Vision       Perception     Praxis      Cognition Arousal/Alertness: Awake/alert Behavior During Therapy: WFL for tasks assessed/performed Overall Cognitive Status: Impaired/Different from baseline                                 General Comments: WFL for tasks assessed.  She was able to recall info and rationale of medical info RN had provided ~ 15 mins prior.           Exercises     Shoulder Instructions       General Comments VSS.  Pt eager to discharge home.  she was very candid about her ETOH history, and housing difficulties she is currently facing     Pertinent Vitals/ Pain       Pain Assessment: Faces Faces Pain Scale: Hurts a little bit Pain Location: back Pain Descriptors / Indicators: Grimacing Pain Intervention(s): Monitored during session  Home Living  Prior Functioning/Environment              Frequency  Min 2X/week        Progress Toward Goals  OT Goals(current goals can now be found in the care plan section)  Progress towards OT goals: Progressing toward goals     Plan Discharge plan remains appropriate    Co-evaluation                 AM-PAC OT "6 Clicks" Daily Activity     Outcome Measure   Help from another person eating meals?: None Help from another person taking care of personal grooming?: None Help from another person toileting, which includes using toliet, bedpan, or urinal?: A Little Help from another person bathing (including washing, rinsing,  drying)?: None Help from another person to put on and taking off regular upper body clothing?: None Help from another person to put on and taking off regular lower body clothing?: A Little 6 Click Score: 22    End of Session    OT Visit Diagnosis: Other abnormalities of gait and mobility (R26.89);Muscle weakness (generalized) (M62.81);Pain;Other symptoms and signs involving cognitive function Pain - part of body: (back )   Activity Tolerance Patient tolerated treatment well   Patient Left in chair;with call bell/phone within reach   Nurse Communication Mobility status        Time: [id number] OT Time Calculation (min): 19 min  Charges: OT General Charges $OT Visit: 1 Visit OT Treatments $Self Care/Home Management : 8-22 mins  Jeani HawkingWendi Laraine Samet, OTR/L Acute Rehabilitation Services Pager [phone] Office [phone]    Jeani HawkingConarpe, Jerae Izard M 07/04/2019, 1:46 PM

## 2019-07-04 NOTE — Progress Notes (Signed)
Patient has been waiting on a ride for over an hour.  Continues to wait.  Discharge instructions completed.  PIVs have been removed per protocol and order.  Patient is dressed and ready for discharge when her ride arrives.

## 2019-07-04 NOTE — Care Management (Signed)
CM informed by [facility] that pts PCP may not be willing to sign HH orders as pt has missed the last 3 appts.  CM contacted physician advisor but was unable to reach.  CM attempted to secure appt with PCP however CM was unable to do so.   [place] to follow up with PCP office and [facility] branch to see if pt can be seen with the requirement that pt will follow up with PCP first thing next week.  CM explained the potential barrier with HH with both pt and attending - both in agreement to discharge home today with the potential that [facility] can not be arranged.

## 2019-07-09 DIAGNOSIS — A4151 Sepsis due to Escherichia coli [E. coli]: Secondary | ICD-10-CM

## 2019-07-09 DIAGNOSIS — E871 Hypo-osmolality and hyponatremia: Secondary | ICD-10-CM

## 2019-07-09 DIAGNOSIS — K922 Gastrointestinal hemorrhage, unspecified: Secondary | ICD-10-CM | POA: Diagnosis not present

## 2019-07-09 DIAGNOSIS — N390 Urinary tract infection, site not specified: Secondary | ICD-10-CM

## 2019-07-09 DIAGNOSIS — E662 Morbid (severe) obesity with alveolar hypoventilation: Secondary | ICD-10-CM

## 2019-07-09 DIAGNOSIS — D62 Acute posthemorrhagic anemia: Secondary | ICD-10-CM

## 2019-07-09 DIAGNOSIS — D689 Coagulation defect, unspecified: Secondary | ICD-10-CM

## 2019-07-09 DIAGNOSIS — F10230 Alcohol dependence with withdrawal, uncomplicated: Secondary | ICD-10-CM

## 2019-07-09 DIAGNOSIS — I8511 Secondary esophageal varices with bleeding: Secondary | ICD-10-CM | POA: Diagnosis not present

## 2019-07-09 DIAGNOSIS — I517 Cardiomegaly: Secondary | ICD-10-CM

## 2019-07-10 DIAGNOSIS — K922 Gastrointestinal hemorrhage, unspecified: Secondary | ICD-10-CM | POA: Diagnosis not present

## 2019-07-10 DIAGNOSIS — I8511 Secondary esophageal varices with bleeding: Secondary | ICD-10-CM | POA: Diagnosis not present

## 2019-07-10 DIAGNOSIS — A4151 Sepsis due to Escherichia coli [E. coli]: Secondary | ICD-10-CM | POA: Diagnosis not present

## 2019-07-10 DIAGNOSIS — F10230 Alcohol dependence with withdrawal, uncomplicated: Secondary | ICD-10-CM | POA: Diagnosis not present

## 2019-07-11 DIAGNOSIS — A4151 Sepsis due to Escherichia coli [E. coli]: Secondary | ICD-10-CM | POA: Diagnosis not present

## 2019-07-11 DIAGNOSIS — I8511 Secondary esophageal varices with bleeding: Secondary | ICD-10-CM | POA: Diagnosis not present

## 2019-07-11 DIAGNOSIS — K922 Gastrointestinal hemorrhage, unspecified: Secondary | ICD-10-CM | POA: Diagnosis not present

## 2019-07-11 DIAGNOSIS — F10230 Alcohol dependence with withdrawal, uncomplicated: Secondary | ICD-10-CM | POA: Diagnosis not present

## 2019-07-12 DIAGNOSIS — K922 Gastrointestinal hemorrhage, unspecified: Secondary | ICD-10-CM | POA: Diagnosis not present

## 2019-07-12 DIAGNOSIS — I8511 Secondary esophageal varices with bleeding: Secondary | ICD-10-CM | POA: Diagnosis not present

## 2019-07-12 DIAGNOSIS — A4151 Sepsis due to Escherichia coli [E. coli]: Secondary | ICD-10-CM | POA: Diagnosis not present

## 2019-07-12 DIAGNOSIS — F10230 Alcohol dependence with withdrawal, uncomplicated: Secondary | ICD-10-CM | POA: Diagnosis not present

## 2019-07-13 DIAGNOSIS — F10230 Alcohol dependence with withdrawal, uncomplicated: Secondary | ICD-10-CM | POA: Diagnosis not present

## 2019-07-13 DIAGNOSIS — I8511 Secondary esophageal varices with bleeding: Secondary | ICD-10-CM | POA: Diagnosis not present

## 2019-07-13 DIAGNOSIS — A4151 Sepsis due to Escherichia coli [E. coli]: Secondary | ICD-10-CM | POA: Diagnosis not present

## 2019-07-13 DIAGNOSIS — K922 Gastrointestinal hemorrhage, unspecified: Secondary | ICD-10-CM | POA: Diagnosis not present

## 2019-07-14 DIAGNOSIS — I8511 Secondary esophageal varices with bleeding: Secondary | ICD-10-CM | POA: Diagnosis not present

## 2019-07-14 DIAGNOSIS — F10230 Alcohol dependence with withdrawal, uncomplicated: Secondary | ICD-10-CM | POA: Diagnosis not present

## 2019-07-14 DIAGNOSIS — A4151 Sepsis due to Escherichia coli [E. coli]: Secondary | ICD-10-CM | POA: Diagnosis not present

## 2019-07-14 DIAGNOSIS — K922 Gastrointestinal hemorrhage, unspecified: Secondary | ICD-10-CM | POA: Diagnosis not present

## 2019-07-15 DIAGNOSIS — A4151 Sepsis due to Escherichia coli [E. coli]: Secondary | ICD-10-CM | POA: Diagnosis not present

## 2019-07-15 DIAGNOSIS — K922 Gastrointestinal hemorrhage, unspecified: Secondary | ICD-10-CM | POA: Diagnosis not present

## 2019-07-15 DIAGNOSIS — I8511 Secondary esophageal varices with bleeding: Secondary | ICD-10-CM | POA: Diagnosis not present

## 2019-07-15 DIAGNOSIS — F10230 Alcohol dependence with withdrawal, uncomplicated: Secondary | ICD-10-CM | POA: Diagnosis not present

## 2019-07-16 DIAGNOSIS — A4151 Sepsis due to Escherichia coli [E. coli]: Secondary | ICD-10-CM | POA: Diagnosis not present

## 2019-07-16 DIAGNOSIS — F10230 Alcohol dependence with withdrawal, uncomplicated: Secondary | ICD-10-CM | POA: Diagnosis not present

## 2019-07-16 DIAGNOSIS — K922 Gastrointestinal hemorrhage, unspecified: Secondary | ICD-10-CM | POA: Diagnosis not present

## 2019-07-16 DIAGNOSIS — I8511 Secondary esophageal varices with bleeding: Secondary | ICD-10-CM | POA: Diagnosis not present

## 2019-07-17 DIAGNOSIS — I8511 Secondary esophageal varices with bleeding: Secondary | ICD-10-CM | POA: Diagnosis not present

## 2019-07-17 DIAGNOSIS — F10230 Alcohol dependence with withdrawal, uncomplicated: Secondary | ICD-10-CM | POA: Diagnosis not present

## 2019-07-17 DIAGNOSIS — A4151 Sepsis due to Escherichia coli [E. coli]: Secondary | ICD-10-CM | POA: Diagnosis not present

## 2019-07-17 DIAGNOSIS — K922 Gastrointestinal hemorrhage, unspecified: Secondary | ICD-10-CM | POA: Diagnosis not present

## 2019-07-18 DIAGNOSIS — F10230 Alcohol dependence with withdrawal, uncomplicated: Secondary | ICD-10-CM | POA: Diagnosis not present

## 2019-07-18 DIAGNOSIS — K922 Gastrointestinal hemorrhage, unspecified: Secondary | ICD-10-CM | POA: Diagnosis not present

## 2019-07-18 DIAGNOSIS — A4151 Sepsis due to Escherichia coli [E. coli]: Secondary | ICD-10-CM | POA: Diagnosis not present

## 2019-07-18 DIAGNOSIS — I8511 Secondary esophageal varices with bleeding: Secondary | ICD-10-CM | POA: Diagnosis not present

## 2019-08-26 DIAGNOSIS — A4151 Sepsis due to Escherichia coli [E. coli]: Secondary | ICD-10-CM | POA: Insufficient documentation

## 2019-08-26 DIAGNOSIS — K6812 Psoas muscle abscess: Secondary | ICD-10-CM | POA: Insufficient documentation

## 2019-08-26 DIAGNOSIS — M4620 Osteomyelitis of vertebra, site unspecified: Secondary | ICD-10-CM | POA: Insufficient documentation

## 2019-08-28 DIAGNOSIS — R0902 Hypoxemia: Secondary | ICD-10-CM

## 2019-08-28 DIAGNOSIS — E871 Hypo-osmolality and hyponatremia: Secondary | ICD-10-CM

## 2019-08-28 DIAGNOSIS — R000 Tachycardia, unspecified: Secondary | ICD-10-CM | POA: Diagnosis not present

## 2019-08-28 DIAGNOSIS — I10 Essential (primary) hypertension: Secondary | ICD-10-CM

## 2019-08-28 DIAGNOSIS — R7989 Other specified abnormal findings of blood chemistry: Secondary | ICD-10-CM

## 2019-08-28 DIAGNOSIS — N390 Urinary tract infection, site not specified: Secondary | ICD-10-CM

## 2019-08-29 DIAGNOSIS — R7989 Other specified abnormal findings of blood chemistry: Secondary | ICD-10-CM | POA: Diagnosis not present

## 2019-08-29 DIAGNOSIS — E871 Hypo-osmolality and hyponatremia: Secondary | ICD-10-CM | POA: Diagnosis not present

## 2019-08-29 DIAGNOSIS — N390 Urinary tract infection, site not specified: Secondary | ICD-10-CM | POA: Diagnosis not present

## 2019-08-29 DIAGNOSIS — R000 Tachycardia, unspecified: Secondary | ICD-10-CM | POA: Diagnosis not present

## 2019-08-30 DIAGNOSIS — R7989 Other specified abnormal findings of blood chemistry: Secondary | ICD-10-CM | POA: Diagnosis not present

## 2019-08-30 DIAGNOSIS — E871 Hypo-osmolality and hyponatremia: Secondary | ICD-10-CM | POA: Diagnosis not present

## 2019-08-30 DIAGNOSIS — R000 Tachycardia, unspecified: Secondary | ICD-10-CM | POA: Diagnosis not present

## 2019-08-30 DIAGNOSIS — N390 Urinary tract infection, site not specified: Secondary | ICD-10-CM | POA: Diagnosis not present

## 2019-08-31 DIAGNOSIS — R7989 Other specified abnormal findings of blood chemistry: Secondary | ICD-10-CM | POA: Diagnosis not present

## 2019-08-31 DIAGNOSIS — R000 Tachycardia, unspecified: Secondary | ICD-10-CM | POA: Diagnosis not present

## 2019-08-31 DIAGNOSIS — E871 Hypo-osmolality and hyponatremia: Secondary | ICD-10-CM | POA: Diagnosis not present

## 2019-08-31 DIAGNOSIS — N390 Urinary tract infection, site not specified: Secondary | ICD-10-CM | POA: Diagnosis not present

## 2019-09-01 DIAGNOSIS — E871 Hypo-osmolality and hyponatremia: Secondary | ICD-10-CM | POA: Diagnosis not present

## 2019-09-01 DIAGNOSIS — R7989 Other specified abnormal findings of blood chemistry: Secondary | ICD-10-CM | POA: Diagnosis not present

## 2019-09-01 DIAGNOSIS — N390 Urinary tract infection, site not specified: Secondary | ICD-10-CM | POA: Diagnosis not present

## 2019-09-01 DIAGNOSIS — R000 Tachycardia, unspecified: Secondary | ICD-10-CM | POA: Diagnosis not present

## 2019-09-02 DIAGNOSIS — E871 Hypo-osmolality and hyponatremia: Secondary | ICD-10-CM | POA: Diagnosis not present

## 2019-09-02 DIAGNOSIS — R000 Tachycardia, unspecified: Secondary | ICD-10-CM | POA: Diagnosis not present

## 2019-09-02 DIAGNOSIS — N390 Urinary tract infection, site not specified: Secondary | ICD-10-CM | POA: Diagnosis not present

## 2019-09-02 DIAGNOSIS — R7989 Other specified abnormal findings of blood chemistry: Secondary | ICD-10-CM | POA: Diagnosis not present

## 2019-09-03 DIAGNOSIS — R7989 Other specified abnormal findings of blood chemistry: Secondary | ICD-10-CM | POA: Diagnosis not present

## 2019-09-03 DIAGNOSIS — N390 Urinary tract infection, site not specified: Secondary | ICD-10-CM | POA: Diagnosis not present

## 2019-09-03 DIAGNOSIS — R000 Tachycardia, unspecified: Secondary | ICD-10-CM | POA: Diagnosis not present

## 2019-09-03 DIAGNOSIS — E871 Hypo-osmolality and hyponatremia: Secondary | ICD-10-CM | POA: Diagnosis not present

## 2019-09-04 DIAGNOSIS — R000 Tachycardia, unspecified: Secondary | ICD-10-CM | POA: Diagnosis not present

## 2019-09-04 DIAGNOSIS — E871 Hypo-osmolality and hyponatremia: Secondary | ICD-10-CM | POA: Diagnosis not present

## 2019-09-04 DIAGNOSIS — N390 Urinary tract infection, site not specified: Secondary | ICD-10-CM | POA: Diagnosis not present

## 2019-09-04 DIAGNOSIS — R7989 Other specified abnormal findings of blood chemistry: Secondary | ICD-10-CM | POA: Diagnosis not present

## 2019-09-05 DIAGNOSIS — E871 Hypo-osmolality and hyponatremia: Secondary | ICD-10-CM | POA: Diagnosis not present

## 2019-09-05 DIAGNOSIS — R7989 Other specified abnormal findings of blood chemistry: Secondary | ICD-10-CM | POA: Diagnosis not present

## 2019-09-05 DIAGNOSIS — R000 Tachycardia, unspecified: Secondary | ICD-10-CM | POA: Diagnosis not present

## 2019-09-05 DIAGNOSIS — N390 Urinary tract infection, site not specified: Secondary | ICD-10-CM | POA: Diagnosis not present

## 2019-09-06 DIAGNOSIS — R000 Tachycardia, unspecified: Secondary | ICD-10-CM | POA: Diagnosis not present

## 2019-09-06 DIAGNOSIS — E871 Hypo-osmolality and hyponatremia: Secondary | ICD-10-CM | POA: Diagnosis not present

## 2019-09-06 DIAGNOSIS — N390 Urinary tract infection, site not specified: Secondary | ICD-10-CM | POA: Diagnosis not present

## 2019-09-06 DIAGNOSIS — R7989 Other specified abnormal findings of blood chemistry: Secondary | ICD-10-CM | POA: Diagnosis not present

## 2019-09-07 DIAGNOSIS — R7989 Other specified abnormal findings of blood chemistry: Secondary | ICD-10-CM | POA: Diagnosis not present

## 2019-09-07 DIAGNOSIS — R000 Tachycardia, unspecified: Secondary | ICD-10-CM | POA: Diagnosis not present

## 2019-09-07 DIAGNOSIS — N390 Urinary tract infection, site not specified: Secondary | ICD-10-CM | POA: Diagnosis not present

## 2019-09-07 DIAGNOSIS — E871 Hypo-osmolality and hyponatremia: Secondary | ICD-10-CM | POA: Diagnosis not present

## 2019-09-08 DIAGNOSIS — E871 Hypo-osmolality and hyponatremia: Secondary | ICD-10-CM | POA: Diagnosis not present

## 2019-09-08 DIAGNOSIS — N390 Urinary tract infection, site not specified: Secondary | ICD-10-CM | POA: Diagnosis not present

## 2019-09-08 DIAGNOSIS — R7989 Other specified abnormal findings of blood chemistry: Secondary | ICD-10-CM | POA: Diagnosis not present

## 2019-09-08 DIAGNOSIS — R000 Tachycardia, unspecified: Secondary | ICD-10-CM | POA: Diagnosis not present

## 2019-09-09 DIAGNOSIS — R000 Tachycardia, unspecified: Secondary | ICD-10-CM | POA: Diagnosis not present

## 2019-09-09 DIAGNOSIS — E871 Hypo-osmolality and hyponatremia: Secondary | ICD-10-CM | POA: Diagnosis not present

## 2019-09-09 DIAGNOSIS — N390 Urinary tract infection, site not specified: Secondary | ICD-10-CM | POA: Diagnosis not present

## 2019-09-09 DIAGNOSIS — R7989 Other specified abnormal findings of blood chemistry: Secondary | ICD-10-CM | POA: Diagnosis not present

## 2019-09-10 DIAGNOSIS — N390 Urinary tract infection, site not specified: Secondary | ICD-10-CM | POA: Diagnosis not present

## 2019-09-10 DIAGNOSIS — E871 Hypo-osmolality and hyponatremia: Secondary | ICD-10-CM | POA: Diagnosis not present

## 2019-09-10 DIAGNOSIS — R000 Tachycardia, unspecified: Secondary | ICD-10-CM | POA: Diagnosis not present

## 2019-09-10 DIAGNOSIS — R7989 Other specified abnormal findings of blood chemistry: Secondary | ICD-10-CM | POA: Diagnosis not present

## 2019-09-11 DIAGNOSIS — N390 Urinary tract infection, site not specified: Secondary | ICD-10-CM | POA: Diagnosis not present

## 2019-09-11 DIAGNOSIS — R000 Tachycardia, unspecified: Secondary | ICD-10-CM | POA: Diagnosis not present

## 2019-09-11 DIAGNOSIS — R7989 Other specified abnormal findings of blood chemistry: Secondary | ICD-10-CM | POA: Diagnosis not present

## 2019-09-11 DIAGNOSIS — E871 Hypo-osmolality and hyponatremia: Secondary | ICD-10-CM | POA: Diagnosis not present

## 2019-09-12 DIAGNOSIS — R000 Tachycardia, unspecified: Secondary | ICD-10-CM | POA: Diagnosis not present

## 2019-09-12 DIAGNOSIS — R7989 Other specified abnormal findings of blood chemistry: Secondary | ICD-10-CM | POA: Diagnosis not present

## 2019-09-12 DIAGNOSIS — N390 Urinary tract infection, site not specified: Secondary | ICD-10-CM | POA: Diagnosis not present

## 2019-09-12 DIAGNOSIS — E871 Hypo-osmolality and hyponatremia: Secondary | ICD-10-CM | POA: Diagnosis not present

## 2019-09-13 DIAGNOSIS — N390 Urinary tract infection, site not specified: Secondary | ICD-10-CM | POA: Diagnosis not present

## 2019-09-13 DIAGNOSIS — E871 Hypo-osmolality and hyponatremia: Secondary | ICD-10-CM | POA: Diagnosis not present

## 2019-09-13 DIAGNOSIS — R7989 Other specified abnormal findings of blood chemistry: Secondary | ICD-10-CM | POA: Diagnosis not present

## 2019-09-13 DIAGNOSIS — R000 Tachycardia, unspecified: Secondary | ICD-10-CM | POA: Diagnosis not present

## 2019-09-14 DIAGNOSIS — R000 Tachycardia, unspecified: Secondary | ICD-10-CM | POA: Diagnosis not present

## 2019-09-14 DIAGNOSIS — N390 Urinary tract infection, site not specified: Secondary | ICD-10-CM | POA: Diagnosis not present

## 2019-09-14 DIAGNOSIS — R7989 Other specified abnormal findings of blood chemistry: Secondary | ICD-10-CM | POA: Diagnosis not present

## 2019-09-14 DIAGNOSIS — E871 Hypo-osmolality and hyponatremia: Secondary | ICD-10-CM | POA: Diagnosis not present

## 2019-09-15 DIAGNOSIS — E871 Hypo-osmolality and hyponatremia: Secondary | ICD-10-CM | POA: Diagnosis not present

## 2019-09-15 DIAGNOSIS — R7989 Other specified abnormal findings of blood chemistry: Secondary | ICD-10-CM | POA: Diagnosis not present

## 2019-09-15 DIAGNOSIS — N390 Urinary tract infection, site not specified: Secondary | ICD-10-CM | POA: Diagnosis not present

## 2019-09-15 DIAGNOSIS — R000 Tachycardia, unspecified: Secondary | ICD-10-CM | POA: Diagnosis not present

## 2019-09-26 ENCOUNTER — Telehealth: Payer: Self-pay | Admitting: Gastroenterology

## 2019-09-26 NOTE — Telephone Encounter (Signed)
DOD 09/26/19  Dr. Havery Moros, there is a referral from Dr. Jannette Fogo at [facility] for pt to schedule for evaluation of cirrhosis of the liver.  Records will be sent to you for review.

## 2019-10-02 NOTE — Telephone Encounter (Signed)
Left message to call back. Ok to schedule ov per Dr. Havery Moros.

## 2019-10-21 ENCOUNTER — Other Ambulatory Visit: Payer: Self-pay

## 2019-10-21 ENCOUNTER — Encounter: Payer: Self-pay | Admitting: Nurse Practitioner

## 2019-10-21 ENCOUNTER — Ambulatory Visit (INDEPENDENT_AMBULATORY_CARE_PROVIDER_SITE_OTHER): Payer: BC Managed Care – PPO | Admitting: Nurse Practitioner

## 2019-10-21 VITALS — BP 120/80 | Temp 97.4°F | Ht 62.0 in | Wt 227.0 lb

## 2019-10-21 DIAGNOSIS — Z1211 Encounter for screening for malignant neoplasm of colon: Secondary | ICD-10-CM

## 2019-10-21 DIAGNOSIS — K7460 Unspecified cirrhosis of liver: Secondary | ICD-10-CM

## 2019-10-21 NOTE — Patient Instructions (Addendum)
If you are age 50 or older, your body mass index should be between 23-30. Your Body mass index is 41.52 kg/m. If this is out of the aforementioned range listed, please consider follow up with your Primary Care Provider.  If you are age 48 or younger, your body mass index should be between 19-25. Your Body mass index is 41.52 kg/m. If this is out of the aformentioned range listed, please consider follow up with your Primary Care Provider.   You have been scheduled for an endoscopy and colonoscopy. Please follow the written instructions given to you at your visit today. Please pick up your prep supplies at the pharmacy within the next 1-3 days. If you use inhalers (even only as needed), please bring them with you on the day of your procedure. Your physician has requested that you go to www.startemmi.com and enter the access code given to you at your visit today. This web site gives a general overview about your procedure. However, you should still follow specific instructions given to you by our office regarding your preparation for the procedure.  We have sent the following medications to your pharmacy for you to pick up at your convenience: Suprep  Due to recent COVID-19 restrictions implemented by our local and state authorities and in an effort to keep both patients and staff as safe as possible, our hospital system now requires COVID-19 testing prior to any scheduled hospital procedure. Please go to our [facility] location drive thru testing site ([address], [place], [place] [address]) on 10/30/19 at  11:15 am. There will be multiple testing areas, the first checkpoint being for pre-procedure/surgery testing. Get into the right (yellow) lane that leads to the PAT testing team. You will not be billed at the time of testing but may receive a bill later depending on your insurance. The approximate cost of the test is $100. You must agree to quarantine from the time of your testing until the  procedure date on 11/03/19 . This should include staying at home with ONLY the people you live with. Avoid take-out, grocery store shopping or leaving the house for any non-emergent reason. Failure to have your COVID-19 test done on the date and time you have been scheduled will result in cancellation of procedure. Please call our office at [phone] if you have any questions.   You have been given a  2 gram Sodium diet (daily)  Follow up with Dr. Havery Moros in three months.  Please call the office to make an appointment as the schedule is not available at this time.  Thank you for choosing me and [facility].   Tiffany Savoy, NP

## 2019-10-21 NOTE — Progress Notes (Signed)
ASSESSMENT / PLAN:   1. 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 Ultrasound during a July hospitalization showed cirrhosis without ascites.  MELD 17( towards end of that hospitalization and after correction of hyponatremia and AKI) . She has since been rehospitalized at [facility] ( ? For UTI and "cysts" on spine) and apparently underwent a 3.5 L paracentesis during that admission.  Etiology of cirrhosis not yet determined but possibly secondary to EtOH.  -Update labs with CMET, CBC, INR -Obtain panel of labs looking for autoimmune/genetic/viral/metabolic etiologies of chronic liver disease -Continue lactulose for hx of HE (in July).  She says the dose is being titrated to 2-4 loose bowel movements a day -HCC screening: no focal liver lesions on ultrasound July 2020.  Obtain AFP -Varices screening. Will schedule patient for EGD. The risks and benefits of EGD were discussed and the patient agrees to proceed.  Procedure will be done at [facility] given her very limited mobilty -No significant edema and she doesn't feel like ascites has reaccumulated so will not make changes to diuretics though she is only on 25 mg of Aldactone daily. She doesn't add salt to food but short of that her sodium intake isn't monitored so recommended 2 gram sodium diet  2. Thrombocytopenia, likely secondary to cirrhosis and maybe bone marrow suppression from ETOH. Platelets 69 at last check mid July.  -update CBC  3. Colon cancer screening.  -We will schedule her for a screening colonoscopy to be done at the time of EGD. The risks and benefits of colonoscopy with possible polypectomy / biopsies were discussed and the patient agrees to proceed.    HPI:    Referring Provider:    Dr. Donnel Saxon   Reason for referral:    cirrhosis  Chief Complaint:     Patient is a 48 year old female with PMH significant for obesity , hypertension, chronic alcoholism, depression and  anxiety.  Patient was hospitalized early July after being found on the floor .  She was admitted with severe hyponatremia, hypoglycemia , AKI, rhabdomyolysis and possibly alcohol withdrawal.  Ultrasound showed cirrhosis, no focal liver lesions . She is referred by Dr. Ardelle Park at [facility] for evaluation of cirrhosis.  Her records have already been reviewed by Dr. Cloretta Ned was admitted to the hospital in July after being found on the floor where she had apparently been for several hours.  She was admitted with hypoglycemia, profound hyponatremia, AKI and rhabdomyolysis.  Testing that admission included an ultrasound remarkable for cirrhosis without ascites.  Her ammonia level was elevated up to 52.  She had altered mental status but that could have been related to hepatic encephalopathy and/or profound hyponatremia.  Her lactulose is being titrated to 2-4 loose bowel movements a day Venetta's maternal grandfather had liver cancer apparently in the absence of cirrhosis.  She was a heavy drinker throughout her 30s and into early 80s.  Since then she has continued to drink but not excessively.  Reha says she was rehospitalized at [facility] in September.  She was having problems walking and says an MRI revealed cyst on her spine.  Romey says she had a 3.5 L paracentesis done during that admission and has felt much better since.  She does not feel like there is any any reaccumulation of ascites discharged from hospital to a nursing home where she has been receiving  IV antibiotics and physical therapy.  She still has very limited mobility  Data Reviewed:  07/04/2019 Sodium 131, BUN 10, creatinine 0.54, magnesium low at 1.5, ammonia elevated at 44 WBC 19K, hemoglobin 10.9, MCV 104.5, platelets 69 INR 1.5    Past Medical History:  Diagnosis Date  . Alcoholism ([place])   . Anxiety   . Depression   . Fatty liver   . Hypercholesteremia   . Hypertension      No past  surgical history on file. Family History  Problem Relation Age of Onset  . Diabetes Mellitus II Mother   . Hypertension Mother   . Heart failure Mother   . Mental illness Maternal Grandfather   . Mental illness Maternal Aunt    Social History   Tobacco Use  . Smoking status: Never Smoker  . Smokeless tobacco: Never Used  Substance Use Topics  . Alcohol use: Yes    Comment: occasional  . Drug use: No   Current Outpatient Medications  Medication Sig Dispense Refill  . ALPRAZolam (XANAX) 0.5 MG tablet Take 0.5 mg by mouth 2 (two) times daily as needed for anxiety.    . citalopram (CELEXA) 40 MG tablet Take 1 tablet (40 mg total) by mouth daily. (Patient not taking: Reported on 06/29/2019) 90 tablet 0  . diclofenac (VOLTAREN) 75 MG EC tablet Take 75 mg by mouth daily as needed (pain).     . folic acid (FOLVITE) 1 MG tablet Take 1 tablet (1 mg total) by mouth daily. (Patient not taking: Reported on 06/29/2019) 30 tablet 0  . gabapentin (NEURONTIN) 100 MG capsule Take 1 capsule (100 mg total) by mouth 3 (three) times daily. (Patient not taking: Reported on 06/29/2019) 270 capsule 0  . lisinopril (ZESTRIL) 20 MG tablet Take 20 mg by mouth daily.   12  . metoprolol tartrate (LOPRESSOR) 25 MG tablet Take 0.5 tablets (12.5 mg total) by mouth 2 (two) times daily. 60 tablet 0  . Multiple Vitamin (MULTIVITAMIN WITH MINERALS) TABS tablet Take 1 tablet by mouth daily. (Patient not taking: Reported on 06/29/2019) 30 tablet 0  . POTASSIUM PO Take 1 tablet by mouth daily.    . traZODone (DESYREL) 100 MG tablet Take 100 mg by mouth at bedtime as needed for sleep.      No current facility-administered medications for this visit.    No Known Allergies   Review of Systems: All systems reviewed and negative except where noted in HPI.   Physical Exam:    Wt Readings from Last 3 Encounters:  10/21/19 227 lb (103 kg)  07/04/19 270 lb 8.1 oz (122.7 kg)  02/13/16 229 lb 4.5 oz (104 kg)    Temp (!) 97.4  F (36.3 C)   Ht 5\' 2"  (1.575 m)   Wt 227 lb (103 kg)   BMI 41.52 kg/m  Constitutional:  Pleasant female in no acute distress. Psychiatric: Normal mood and affect. Behavior is normal. EENT: Pupils normal.  Conjunctivae are normal. No scleral icterus. Neck supple.  Cardiovascular: Normal rate, regular rhythm.  BLE 1+ edema  Pulmonary/chest: Effort normal and breath sounds normal. No wheezing, rales or rhonchi. Abdominal: Soft, nondistended, nontender. Bowel sounds active throughout. There are no masses palpable. No hepatomegaly. Neurological: Alert and oriented to person place and time.  No asterixis Skin: Skin is warm and dry. No rashes noted.  Tye Savoy, NP  10/21/2019, 11:06 AM  Cc: Tiffany Nasuti, MD

## 2019-10-22 ENCOUNTER — Encounter: Payer: Self-pay | Admitting: Infectious Diseases

## 2019-10-22 ENCOUNTER — Ambulatory Visit (INDEPENDENT_AMBULATORY_CARE_PROVIDER_SITE_OTHER): Payer: BC Managed Care – PPO | Admitting: Infectious Diseases

## 2019-10-22 VITALS — BP 113/65 | HR 84 | Temp 98.3°F

## 2019-10-22 DIAGNOSIS — K6812 Psoas muscle abscess: Secondary | ICD-10-CM | POA: Diagnosis not present

## 2019-10-22 DIAGNOSIS — F1010 Alcohol abuse, uncomplicated: Secondary | ICD-10-CM | POA: Diagnosis not present

## 2019-10-22 DIAGNOSIS — D696 Thrombocytopenia, unspecified: Secondary | ICD-10-CM

## 2019-10-22 DIAGNOSIS — A4151 Sepsis due to Escherichia coli [E. coli]: Secondary | ICD-10-CM

## 2019-10-22 DIAGNOSIS — M4640 Discitis, unspecified, site unspecified: Secondary | ICD-10-CM

## 2019-10-22 DIAGNOSIS — M4620 Osteomyelitis of vertebra, site unspecified: Secondary | ICD-10-CM

## 2019-10-22 NOTE — Patient Instructions (Addendum)
Continue rocephin 2 gm IV daily until at least next appointment on 11/05/2019. Avoid any alcohol intake further, moving forward. Ensure weekly labs include CBC w/ diff, CMP, and CRP with results to be faxed to Dr. Prince Rome at [phone]. May need to repeat spinal CT scan with contrast to evaluate psoas abscess +/- vertebral discitis. Need start date for rocephin, hospital d/c summary, and imaging reports from [facility] faxed to our office as well.

## 2019-10-22 NOTE — Progress Notes (Signed)
Agree with assessment and plan as outlined. Will await labs, EGD. If renal function stable and she reaccumulates ascites will need increase in diuretics.

## 2019-10-22 NOTE — Progress Notes (Addendum)
Subjective:    Patient ID: Tiffany Howell, female    DOB: 11/14/1971, 48 y.o.   MRN: [id number]  HPI The patient is a 48 year old white female alcoholic who presents today for an evaluation following an admission to [facility] for E. 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  Unfortunately, the majority of the patient's records today are unavailable for my review as she was referred by her nursing home medical director rather than [facility].  Specifically, I do not have availability of her recent discharge summary, imaging results, or culture results.  Instead, I have her current medication administration record which confirmed she has been on Rocephin daily since at least September 15, 2019.  Also, is a listing of diagnoses that includes vertebral osteomyelitis and psoas abscess but is absent E. coli sepsis.  This latter diagnosis was confirmed only when I contacted the [facility] microbiology department, confirming she had a positive blood culture for E. coli on August 30, 2019.  Her E. coli isolate showed resistance to ampicillin, Unasyn, and Bactrim.  Repeat blood cultures obtained the following day were negative at their facility.  Due to the patient's cirrhosis and hepatic encephalopathy, she poorly recalls the events that led to her admission.  However, she does report back pain while at their facility but does not recall having a preceding fall nor any surgical or percutaneous intervention performed to her back.  She did have a paracentesis performed while at [facility], but analysis of her ascitic fluid is unavailable for my review.  The patient has now been at [facility] skilled nursing facility in [place], [place] for the past month.  Her care there is overseen by Dr. Posey Rea who referred the patient to our office.  She currently has a PICC line in place and is receiving Rocephin 2 g IV  daily.  She admits to drinking extremely heavily over the last 2 to 3 years, especially following the death of her mother 2 years ago.  As a result of her alcohol abuse, she has lost custody of her children and been unable to maintain work and shelter for herself.  While at the rehab facility, she states that she is making efforts to ambulate daily with the use of a walker, but she currently is able to ambulate only approximately 10 to 15 feet at a time.  Per her report, she has never required assistive devices to ambulate prior to her recent admission to the hospital.  She does report diarrhea but attributes this mostly to her 2-3 times a day lactulose which she takes to control her hepatic encephalopathy.  I do not have a trend of labs for the patient, but her SNF did send one set of laboratories from October 23rd which shows a normal white blood cell count of 4600, a hemoglobin of 10.3, a platelet count of 88,000, a CRP within normal limits at 0.45, and a creatinine of 0.59.  Other labs were faxed but unreadable from the report that came through on the machine.  She has tolerated her Rocephin treatments daily without event.  Her right arm PICC line is intact without any complicating features.   Past Medical History:  Diagnosis Date   Alcoholism ([place])    Anxiety    Depression    Fatty liver    Hypercholesteremia    Hypertension     No past surgical history on file.   Family History  Problem Relation Age  of Onset   Diabetes Mellitus II Mother    Hypertension Mother    Heart failure Mother    Mental illness Maternal Grandfather    Mental illness Maternal Aunt      Social History   Tobacco Use   Smoking status: Never Smoker   Smokeless tobacco: Never Used  Substance Use Topics   Alcohol use: Yes    Comment: occasional   Drug use: No      reports being sexually active. She reports using the following method of birth control/protection: I.U.D..   Outpatient  Medications Prior to Visit  Medication Sig Dispense Refill   Baclofen 5 MG TABS Take 1 tablet by mouth daily as needed.     cefTRIAXone Sodium (ROCEPHIN IV) Inject 2 g into the vein.     diphenhydrAMINE (BENADRYL) 25 MG tablet Take 25 mg by mouth as needed.     docusate sodium (COLACE) 100 MG capsule Take 100 mg by mouth 2 (two) times daily as needed for mild constipation.     gabapentin (NEURONTIN) 300 MG capsule Take 300 mg by mouth 3 (three) times daily.     lactulose (CHRONULAC) 10 GM/15ML solution Take by mouth 4 (four) times daily.     Lactulose 20 GM/30ML SOLN Take by mouth as needed.     Magnesium 500 MG CAPS Take 1 capsule by mouth 2 (two) times daily as needed.     Multiple Vitamin (MULTIVITAMIN WITH MINERALS) TABS tablet Take 1 tablet by mouth daily. 30 tablet 0   naloxone (NARCAN) 4 MG/0.1ML LIQD nasal spray kit Place 1 spray into the nose as needed.     OxyCODONE HCl, Abuse Deter, (OXAYDO) 5 MG TABA Take 1 tablet by mouth every 6 (six) hours.     POTASSIUM PO Take 1 tablet by mouth daily.     Simethicone 125 MG TABS Take 1 tablet by mouth as needed.     spironolactone (ALDACTONE) 25 MG tablet Take 25 mg by mouth daily.     thiamine 100 MG tablet Take 100 mg by mouth daily.     torsemide (DEMADEX) 20 MG tablet Take 20 mg by mouth daily.     traZODone (DESYREL) 50 MG tablet Take 100 mg by mouth 2 (two) times daily. Take an additiona 1/2 tablet at bedtime prn sleep     lisinopril (ZESTRIL) 20 MG tablet Take 20 mg by mouth daily.   12   metoprolol tartrate (LOPRESSOR) 25 MG tablet Take 0.5 tablets (12.5 mg total) by mouth 2 (two) times daily. 60 tablet 0   No facility-administered medications prior to visit.      No Known Allergies    Review of Systems  Constitutional: Positive for fatigue. Negative for chills and fever.  HENT: Negative for congestion, hearing loss and sinus pressure.   Eyes: Negative for photophobia, discharge, redness and visual  disturbance.  Respiratory: Negative for apnea, cough, shortness of breath and wheezing.   Cardiovascular: Negative for chest pain and leg swelling.  Gastrointestinal: Positive for diarrhea. Negative for abdominal distention, abdominal pain, constipation, nausea and vomiting.       While on lactulose  Endocrine: Negative for cold intolerance, heat intolerance, polydipsia and polyuria.  Genitourinary: Negative for dysuria, flank pain, frequency, urgency, vaginal bleeding and vaginal discharge.  Musculoskeletal: Positive for back pain. Negative for arthralgias, joint swelling and neck pain.  Skin: Negative for pallor, rash and wound.  Allergic/Immunologic: Negative for immunocompromised state.  Neurological: Positive for weakness. Negative for dizziness, seizures,  speech difficulty and headaches.  Hematological: Does not bruise/bleed easily.  Psychiatric/Behavioral: Negative for agitation, confusion, hallucinations and sleep disturbance. The patient is nervous/anxious.        Objective:     Vitals:   10/22/19 0933  BP: 113/65  Pulse: 84  Temp: 98.3 F (36.8 C)     Physical Exam Gen: pleasant, obese, NAD, A&Ox 3 Head: NCAT, no temporal wasting evident EENT: PERRL, EOMI, MMM, adequate dentition Neck: supple, no JVD CV: NRRR, no murmurs evident Pulm: CTA bilaterally, no wheeze or retractions Abd: soft, obese, NTND, +BS Extrems:  1+ non-pitting LE edema, 1+ pulses MSK: TTP at [facility] joint w/o overlying erythema Skin: no rashes, adequate skin turgor Neuro: CN II-XII grossly intact, no focal neurologic deficits appreciated but pt was wheelchair bound making for difficult LE exam, gait was unable to be assessed, A&Ox 3    OPAT ORDERS:  Diagnosis: E. Coli sepsis/psoas abscess/possible vertebral osteomyelitis  Culture Result:  E. Coli (resistant to ampicillin/bactrim, and unasyn per [employer]. Micro lab)  No Known Allergies  Discharge antibiotics: Rocephin 2 gm IV  daily  Duration: 8 weeks End Date: ? At least until November 05, 2019  [facility] Care and Maintenance Per Protocol __ Please pull PIC at completion of IV antibiotics _X_ Please leave PIC in place until doctor has seen patient or been notified  Labs weekly while on IV antibiotics: _X_ CBC with differential _X_ BMP __ BMP TWICE WEEKLY** __ CMP _X_ CRP __ ESR __ Vancomycin trough  Fax weekly labs to [phone]  Clinic Follow Up Appt: Dr. Prince Rome at [facility] clinic in 2 weeks       Assessment & Plan:  The patient is a 48 year old obese white female alcoholic with recent decompensated cirrhosis and E. 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  1. E. Coli sepsis -positive blood cultures for E. coli were confirmed at [facility] on August 30, 2019.  Repeat blood cultures the following day did confirm clearance of her bacteremia.  As I do not have her discharge summary from the hospital available for me today it is unclear when the patient began antibiotics and with which antibiotic she had previously received treatment.  I am only able to confirm that she has been on Rocephin for greater than 4 weeks at this time.  Given concern for other disseminated sites, I am unable to determine a final duration of treatment at this time, but will extend the patient's antibiotics until November 05, 2019 at her current dosing given concerns for vertebral osteomyelitis and psoas abscess.  Orders were sent back to her skilled nursing facility for extension of her antibiotics and for weekly CBCs with differential, BMP, and CRP to be sent to our office so that we may manage her care.  The duration of her stay at the skilled nursing facility obviously will be affected by this as she does not have stable housing nor proper supervision to allow for IV antibiotics to be performed out of a nursing facility at the present time.  2.  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  Given that she has been on antibiotics for a prolonged duration, we will extend her antibiotics as noted above.  Standard of care for vertebral osteomyelitis would be 8 weeks of appropriate parenteral antibiotics.  Unfortunately, the patient  can poorly recall events related to her recent hospital admission, but she does not recall having either a percutaneous or surgical drainage of her psoas abscess.  If her abscess has truly been undrained and if the abscess was greater than 3 cm, she will require repeat imaging of her spine at the location that her vertebral osteomyelitis and psoas abscess was first noted.  Such imaging must be completed prior to cessation of antibiotics.  If imaging confirms persistence or progression of either her psoas abscess or her osteomyelitis, she may require evaluation by either a neurosurgeon or orthopedist to further evaluate.  Currently, the patient denies bladder or bowel incontinence natively, but does admit that she frequently may not get to the bathroom in time after receiving doses of lactulose.  I have requested the patient's discharge summary from [facility] as well as any imaging that she has had done to her spine so that we can form a more cohesive plan for this patient.  We will see her back in 2 weeks time, having her extended her antibiotics in the interim, with the hope that we will have her medical records for review at that time to better establish 1) whether she needs further extension of her antibiotics and 2) whether she needs repeat imaging and/or surgical consultation prior to completion of antibiotic treatment.  Addendum: CT report from 09/11/2019 at [facility] confirmed an 11 mm LT psoas abscess and degenerative changes to L3-L4 disc, so will repeat contrasted CT  of L spine to re-assess prior to her upcoming appointment with [place]. Orders for CT placed and SNF instructed to ensure pt remains on rocephin until her f/u visit with me after CT scan has been performed.  3.  Alcoholic cirrhosis -based on the patient's verbal history and reliance upon lactulose for hepatic encephalopathy and a recent paracentesis, I am concerned that she has developed decompensated cirrhosis.  This may be the most likely setting in which she developed her current bacteremia with widespread dissemination with a pathogen that rarely would cause such extensive disease.  I have counseled the patient strictly that she should never drink alcohol again as this may lead to further bacteremias and other hepatic complications.  She expressed understanding but concerned as she has only been able to maintain sobriety for 6 months at the longest in the past.  Upon discharge from the patient's skilled nursing facility, she should attend alcoholics anonymous meetings, obtain a sponsor, and consider formal alcoholic counseling and rehab.

## 2019-10-28 ENCOUNTER — Telehealth: Payer: Self-pay

## 2019-10-28 NOTE — Addendum Note (Signed)
Addended by: Janine Ores on: 10/28/2019 03:17 PM   Modules accepted: Orders

## 2019-10-28 NOTE — Telephone Encounter (Signed)
Per Dr. Katrinka Blazing patient in attempt to schedule a CT of the lumbar spine w - w/o contrast. Initiated Approval from [facility] to have CT scan approved.   Lenore Cordia, [place]

## 2019-10-30 ENCOUNTER — Inpatient Hospital Stay (HOSPITAL_COMMUNITY): Admission: RE | Admit: 2019-10-30 | Payer: BC Managed Care – PPO | Source: Ambulatory Visit

## 2019-10-31 ENCOUNTER — Telehealth: Payer: Self-pay

## 2019-10-31 NOTE — Progress Notes (Signed)
Attempted to call patient regarding appointment on Monday with Dr. Havery Moros. Also regarding her missed covid test appointment. Both phone numbers would not allow voice mail. Made Esther from Dr. Ozella Rocks office aware.

## 2019-10-31 NOTE — Progress Notes (Signed)
Attempted to obtain medical history via telephone, unable to reach at this time. I left a voicemail to return pre surgical testing department's phone call.  

## 2019-10-31 NOTE — Telephone Encounter (Signed)
Okay thanks for letting me know, hopefully she may call back.

## 2019-10-31 NOTE — Telephone Encounter (Signed)
Received call from Santiago Glad at [place] that patient did not show up for her COVID testing yesterday. Unable to reach by phone, 1 # has no voice mail and the other - the mail box is full. I called the patient's daughter Kyra Searles) she is on the release of information. She states her Mom is in a Rehab center and gave me the phone #. I called and just got voice mail, left message to please call back

## 2019-10-31 NOTE — Progress Notes (Signed)
Attempted to contact patient about missed COVID test appointment, there is no way to leave a voicemail

## 2019-11-02 ENCOUNTER — Encounter (HOSPITAL_COMMUNITY): Payer: Self-pay | Admitting: Certified Registered Nurse Anesthetist

## 2019-11-03 ENCOUNTER — Telehealth: Payer: Self-pay

## 2019-11-03 ENCOUNTER — Telehealth: Payer: Self-pay | Admitting: Gastroenterology

## 2019-11-03 ENCOUNTER — Encounter (HOSPITAL_COMMUNITY): Admission: RE | Payer: Self-pay | Source: Ambulatory Visit

## 2019-11-03 ENCOUNTER — Ambulatory Visit (HOSPITAL_COMMUNITY)
Admission: RE | Admit: 2019-11-03 | Payer: BC Managed Care – PPO | Source: Ambulatory Visit | Admitting: Gastroenterology

## 2019-11-03 SURGERY — COLONOSCOPY WITH PROPOFOL
Anesthesia: Monitor Anesthesia Care

## 2019-11-03 NOTE — Telephone Encounter (Signed)
Called and left a message on patient's voice mail that her EGD/ Colonoscopy scheduled for today would not be able to be done because of not having her COVID testing. Asked her to please call me back.

## 2019-11-03 NOTE — Telephone Encounter (Signed)
Returned [employer] phone call at patient's Rehab center and asked her to please call me back reference patient's COVID test and rescheduling for December block time

## 2019-11-03 NOTE — Progress Notes (Signed)
Called and talked with patient this morning after getting her phone number from a former room mate. Pt stated she was not aware about getting a covid test prior to her procedure. Dr. Havery Moros made aware and given correct phone number. He was going to pass the number on to his nurse who will call to reschedule.  [phone]

## 2019-11-03 NOTE — Telephone Encounter (Signed)
Hoyle Sauer from transportation with [facility] called asking why pt's procedure was cancelled today. I let her know that she no showed her Covid test so we had to cancel her procedure. She stated that nobody informed them about this. Pls call Hoyle Sauer to r/s appts.

## 2019-11-03 NOTE — Telephone Encounter (Signed)
Was finally able to reach a man on one of the phone #s and he said he will try to reach the patient on her cell (I had already left one message on her cell) and get her to call me back.

## 2019-11-04 ENCOUNTER — Telehealth: Payer: Self-pay

## 2019-11-04 NOTE — Telephone Encounter (Signed)
Per Dr. Prince Rome called [facility] to extend patient's IV antibiotics through 11/24. Spoke with Ailene Ravel, Rn who states patient has been off antibiotics since 10/30. RN states she never received orders, and would need new orders faxed to her before she can restart antibiotics.  Per Dr. Prince Rome okay to restart antibiotics. Will have MD sign order sheet before faxing to RN.  [facility].  P: [phone] F: [phone] Aundria Rud, CMA

## 2019-11-04 NOTE — Telephone Encounter (Signed)
Attempted to call Hoyle Sauer from [facility] again (after 3pm per her message) she is out of the facility. Will keep trying to reach her.

## 2019-11-04 NOTE — Telephone Encounter (Signed)
Faxed orders to facility to restart antibiotics. Will follow up tomorrow. [place]

## 2019-11-04 NOTE — Telephone Encounter (Signed)
Orders for labs and rocephin re-written as well as CT scan at [facility] for patient. Spoke with our clinic charge nurse today to touch base with SNF re: report they never received our lab and ABX orders written last month yet were faxing labs results to the number we designated only on that same order form the SNF facility claimed they never received. Next appointment for patient is on 11/18/2019 now to review CT results and labs with patient.

## 2019-11-04 NOTE — Telephone Encounter (Signed)
Was able to speak to patient on the phone today, she understands why the EGD/Colonoscopy was cancelled. Looking forward to trying to schedule on the 12/08/19 block time at the hospital, she doesn't know yet if she will still be in the Rehab, and if so it will require the COVID testing the morning of her procedure.

## 2019-11-04 NOTE — Telephone Encounter (Signed)
-----   Message from Tiffany Flock, MD sent at 11/03/2019  6:25 PM EST ----- Regarding: follow up Hi Tiffany Howell, Would you mind calling this patient sometime this week if you can. She came to the hospital despite [place] telling her not to, and was not happy she could not get the exam done. She will need to be rescheduled with COVID testing done prior in order to proceed. Please let her know we called several times and could not get ahold of her.   Hopefully we may have some room on our day in December if you can book her then. If not, and no openings please let me know.   Her correct phone is [phone]. Thanks

## 2019-11-05 ENCOUNTER — Telehealth: Payer: Self-pay | Admitting: Gastroenterology

## 2019-11-05 NOTE — Telephone Encounter (Signed)
Facility called office this morning to confirm they received orders to restart antibiotics and weekly labs. Facility also confirmed CT orders. Transportation has been set up per Joycelyn Schmid, Teaching laboratory technician. [place]

## 2019-11-05 NOTE — Telephone Encounter (Signed)
Returned patient's call, let her know we don't have a spot in Dec. Hospital schedule right now to put her, but  we are working on it. Checked patient's office note form 10/21/19 and I don't see any antibiotic ordered by [place]. She said it was infectious disease.

## 2019-11-06 ENCOUNTER — Telehealth: Payer: Self-pay

## 2019-11-06 NOTE — Telephone Encounter (Signed)
Called and spoke to [place] at [facility] and the patient. They are not able to do her EGD/Colonoscopy on 11/26/19 or  11/28/19 because of the COVID requirements. They only have one  facility [place] and Wed. And Fri. are their busy Dialysis days. She can not tie up the van in the parking lot all morning waiting for the patient's COVID test to come back. States since it is not urgent and patient may well be back home in January, they want to wait.

## 2019-11-06 NOTE — Telephone Encounter (Signed)
Okay. If that is the case then may be best to wait until January. Can you please add her to the wait list for hospital cases in January when she is out of rehab. Thanks

## 2019-11-18 ENCOUNTER — Encounter: Payer: Self-pay | Admitting: Infectious Diseases

## 2019-11-18 ENCOUNTER — Other Ambulatory Visit: Payer: Self-pay

## 2019-11-18 ENCOUNTER — Ambulatory Visit (INDEPENDENT_AMBULATORY_CARE_PROVIDER_SITE_OTHER): Payer: BC Managed Care – PPO | Admitting: Infectious Diseases

## 2019-11-18 VITALS — BP 110/69 | HR 83 | Temp 98.6°F | Wt 220.0 lb

## 2019-11-18 DIAGNOSIS — A4151 Sepsis due to Escherichia coli [E. coli]: Secondary | ICD-10-CM

## 2019-11-18 DIAGNOSIS — K6812 Psoas muscle abscess: Secondary | ICD-10-CM

## 2019-11-18 DIAGNOSIS — F1010 Alcohol abuse, uncomplicated: Secondary | ICD-10-CM

## 2019-11-18 NOTE — Progress Notes (Signed)
Subjective:    Patient ID: Tiffany Howell, female    DOB: 02/24/1971, 48 y.o.   MRN: [id number]  HPI The patient is a 48 year old white female alcoholic who presents today for a routine return visit for E. coli sepsis, suspected vertebral discitis/osteomyelitis, and psoas abscess.  She was last seen in our clinic on October 22, 2019. Following her last visit, we were able to secure the patient's records from [facility], which confirmed diagnoses of E. Coli sepsis, possible vertebral osteomyelitis and a psoas abscess/phlegmon.  The [facility] hospital's microbiology department, confirmed she had a positive blood culture for E. coli on August 30, 2019.  Her E. coli isolate showed resistance to ampicillin, Unasyn, and Bactrim.  Repeat blood cultures obtained the following day were negative at their facility.  Due to the patient's cirrhosis and hepatic encephalopathy, she poorly recalled the events that led to her admission.  However, she does report back pain while at their facility but does not recall having a preceding fall nor any surgical or percutaneous intervention performed to her back.  She did have a paracentesis performed while at [facility], but analysis of her ascitic fluid remains unavailable for my review.  The patient has now been at [facility] skilled nursing facility in [place], [place] for the past 2 months. She still has a PICC line in place and has been receiving Rocephin 2 g IV daily.  At her last visit, orders were given to her facility to extend her IV antibiotics until November 05, 2019 and for her to have a CT scan of the lumbar spine performed with and without contrast to reassess her psoas abscess/phlegmon.  Unfortunately, the patient's skilled nursing facility stopped her antibiotics on October 30 and did not resume until 10 days later once we were alerted that she was no longer on treatment.  She has remained on Rocephin since  that time.  Repeat CT scan on November 06, 2019 showed extensive changes from her recent infection with bony destruction at L2-L5 but the prior phlegmon to her paraspinal/psoas region was no longer observed.  She admits to drinking extremely heavily prior to her admission to [facility] over the last 2 to 3 years, especially following the death of her mother 2 years ago.  While at rehab, she has progressed to ambulating mostly without a walker and rarely ambulating with the use of a cane in recent weeks.  She is tentatively scheduled for discharge to a local halfway house in the next several days following completion of her antibiotics.  The most recent labs available from her skilled nursing facility were collected on October 31, 2019 and show a glucose of 100, albumin of 2.2, normal LFTs, creatinine of 0.70, white blood cell count of 5600, platelet count of 110,000, and CRP of 0.39 (WNL). She has tolerated her Rocephin treatments daily without event and admits to significant improvement in her prior back pain missed.  Her right arm PICC line remains intact without any complicating features.   Past Medical History:  Diagnosis Date   Alcoholism ([place])    Anxiety    Depression    Fatty liver    Hypercholesteremia    Hypertension     No past surgical history on file.   Family History  Problem Relation Age of Onset   Diabetes Mellitus II Mother    Hypertension Mother    Heart failure Mother    Mental illness Maternal Grandfather  Mental illness Maternal Aunt      Social History   Tobacco Use   Smoking status: Never Smoker   Smokeless tobacco: Never Used  Substance Use Topics   Alcohol use: Yes    Comment: occasional   Drug use: No      reports being sexually active. She reports using the following method of birth control/protection: I.U.D..   Outpatient Medications Prior to Visit  Medication Sig Dispense Refill   Baclofen 5 MG TABS Take 1 tablet by mouth  daily as needed (Muscle spasms).      diphenhydrAMINE (BENADRYL) 25 MG tablet Take 25 mg by mouth as needed for itching.      docusate sodium (COLACE) 100 MG capsule Take 100 mg by mouth 2 (two) times daily.      gabapentin (NEURONTIN) 300 MG capsule Take 300 mg by mouth 3 (three) times daily.     lactulose (CHRONULAC) 10 GM/15ML solution Take 45 g by mouth 4 (four) times daily.      magnesium oxide (MAG-OX) 400 MG tablet Take 400 mg by mouth 3 (three) times daily.     metoprolol tartrate (LOPRESSOR) 25 MG tablet Take 0.5 tablets (12.5 mg total) by mouth 2 (two) times daily. 60 tablet 0   Multiple Vitamin (MULTIVITAMIN WITH MINERALS) TABS tablet Take 1 tablet by mouth daily. 30 tablet 0   naloxone (NARCAN) 4 MG/0.1ML LIQD nasal spray kit Place 1 spray into the nose as needed (Opoid Overdose).      OxyCODONE HCl, Abuse Deter, (OXAYDO) 5 MG TABA Take 1 tablet by mouth every 6 (six) hours as needed (pain).      potassium chloride SA (KLOR-CON) 20 MEQ tablet Take 20 mEq by mouth See admin instructions. Take 40 meq in the morning and 20 meq at bedtime     Simethicone 125 MG TABS Take 1 tablet by mouth as needed (Gas pain).      spironolactone (ALDACTONE) 25 MG tablet Take 25 mg by mouth daily.     thiamine 100 MG tablet Take 100 mg by mouth daily.     torsemide (DEMADEX) 20 MG tablet Take 20 mg by mouth daily.     traZODone (DESYREL) 50 MG tablet Take 25 mg by mouth at bedtime.      No facility-administered medications prior to visit.      No Known Allergies    Review of Systems  Constitutional: Positive for fatigue. Negative for chills and fever.  HENT: Negative for congestion, hearing loss and sinus pressure.   Eyes: Negative for photophobia, discharge, redness and visual disturbance.  Respiratory: Negative for apnea, cough, shortness of breath and wheezing.   Cardiovascular: Negative for chest pain and leg swelling.  Gastrointestinal: Negative for abdominal distention,  abdominal pain, constipation, diarrhea, nausea and vomiting.       While on lactulose  Endocrine: Negative for cold intolerance, heat intolerance, polydipsia and polyuria.  Genitourinary: Negative for dysuria, flank pain, frequency, urgency, vaginal bleeding and vaginal discharge.  Musculoskeletal: Negative for arthralgias, back pain, joint swelling and neck pain.  Skin: Negative for pallor, rash and wound.  Allergic/Immunologic: Negative for immunocompromised state.  Neurological: Negative for dizziness, seizures, speech difficulty, weakness and headaches.  Hematological: Does not bruise/bleed easily.  Psychiatric/Behavioral: Negative for agitation, confusion, hallucinations and sleep disturbance. The patient is nervous/anxious.        Objective:     Vitals:   11/18/19 0930  BP: 110/69  Pulse: 83  Temp: 98.6 F (37 C)  Physical Exam Gen: pleasant, obese, NAD, A&Ox 3 Head: NCAT, no temporal wasting evident EENT: PERRL, EOMI, MMM, adequate dentition Neck: supple, no JVD CV: NRRR, no murmurs evident Pulm: CTA bilaterally, no wheeze or retractions Abd: soft, obese, NTND, +BS Extrems: 1+ non-pitting LE edema, 1+ pulses Skin: no rashes, adequate skin turgor Neuro: CN II-XII grossly intact, no focal neurologic deficits appreciated, gait was slowed and slightly wide stanced, A&Ox 3     Assessment & Plan:  The patient is a 48 year old obese white female alcoholic with recent decompensated cirrhosis and E. coli sepsis presenting today for vertebral osteomyelitis and psoas abscess.  1. E. Coli sepsis -positive blood cultures for E. coli were confirmed at [facility] on August 30, 2019.  Repeat blood cultures the following day did confirm clearance of her bacteremia.  Review of her discharge summary from [facility] confirms that an echocardiogram was performed that showed no evidence of a central source.  She had a interruption in her IV antibiotics  recently as noted below but she is now completing treatment as of tomorrow November 20, 2019.  Orders to remove her PICC line at her skilled nursing facility following tomorrow's dose were written and sent back with the patient to her facility.  2.  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  Unfortunately, the patient's IV antibiotics were recently interrupted by her skilled nursing by her skilled nursing facility.  Her antibiotics were restarted in early November once the area was recognized.  Repeat CT imaging shows resolution of her prior psoas abscess and progression yet improvement in her multilevel lumbar osteomyelitis/discitis.  Clinically, the patient has had significant improvement with efforts for rehab and her inflammatory marker with her CRP has been within normal limits for the last several weeks.  At this time, she will complete her de facto 8-week Rocephin course on November 26 and her PICC line will be removed after tomorrow's dose.  As she has no underlying hardware, she will not benefit from oral consolidative treatment with antibiotics.  She continues to deny bladder or bowel incontinence or other neurological deficits, so orthopedic and/or neurosurgical evaluation is likely unnecessary.  Should the patient develop a recurrence of her infection, particularly to her lumbar region in the next several weeks, she will require an evaluation by a neurosurgeon neurosurgeon as she would have failed attempts for medical monotherapy at that time.  3.  Alcoholic cirrhosis -based on the patient's verbal history and reliance upon lactulose for hepatic encephalopathy and a recent paracentesis, I am concerned that she has developed decompensated cirrhosis.  This may be the most likely setting in which she developed her current bacteremia with widespread dissemination with a pathogen that rarely  would cause such extensive disease.  I have counseled the patient strictly that she should never drink alcohol again as this may lead to further bacteremias and other hepatic complications.  She expressed understanding but concerned as she has only been able to maintain sobriety for 6 months at the longest in the past.  Upon discharge from the patient's skilled nursing facility, she should attend alcoholics anonymous meetings, obtain a sponsor, and consider formal alcoholic counseling and rehab.

## 2019-11-18 NOTE — Patient Instructions (Signed)
D/c PICC on 11/19/2019 after tomorrow's rocephin dose.

## 2019-11-19 ENCOUNTER — Other Ambulatory Visit: Payer: Self-pay

## 2019-11-19 DIAGNOSIS — Z1211 Encounter for screening for malignant neoplasm of colon: Secondary | ICD-10-CM

## 2019-11-27 ENCOUNTER — Telehealth: Payer: Self-pay

## 2019-11-27 NOTE — Telephone Encounter (Signed)
Reached  patient on a new phone# that I got from her brother (patient's record updated). She states she is out of the [facility] and trying to get a lot of things caught up. Says the 01/05/20 date for her EGD/Colonoscopy is not going to work for her. She would like to be put on the Feb. Hospital wait list.

## 2019-11-27 NOTE — Telephone Encounter (Signed)
Okay if that is her preference, but she has already delayed this. Hopefully she can do it in February, thanks

## 2020-01-05 ENCOUNTER — Ambulatory Visit (HOSPITAL_COMMUNITY): Admit: 2020-01-05 | Payer: BC Managed Care – PPO | Admitting: Gastroenterology

## 2020-01-05 ENCOUNTER — Encounter (HOSPITAL_COMMUNITY): Payer: Self-pay

## 2020-01-05 SURGERY — ESOPHAGOGASTRODUODENOSCOPY (EGD) WITH PROPOFOL
Anesthesia: Monitor Anesthesia Care

## 2020-01-23 ENCOUNTER — Telehealth: Payer: Self-pay

## 2020-01-23 NOTE — Telephone Encounter (Signed)
Left message on patient's voice mail to please call us back. Also spoke to patient's boyfriend on another phone # and asked him to please have patient call me back at [phone].( Trying to see if patient can hospital procedure on 02/02/20)

## 2020-04-20 DIAGNOSIS — R601 Generalized edema: Secondary | ICD-10-CM | POA: Diagnosis not present

## 2020-04-20 DIAGNOSIS — D649 Anemia, unspecified: Secondary | ICD-10-CM | POA: Diagnosis not present

## 2020-04-20 DIAGNOSIS — R188 Other ascites: Secondary | ICD-10-CM | POA: Diagnosis not present

## 2020-04-20 DIAGNOSIS — I350 Nonrheumatic aortic (valve) stenosis: Secondary | ICD-10-CM | POA: Diagnosis not present

## 2020-04-21 ENCOUNTER — Inpatient Hospital Stay
Admission: AD | Admit: 2020-04-21 | Payer: BC Managed Care – PPO | Source: Other Acute Inpatient Hospital | Admitting: Internal Medicine

## 2020-04-21 DIAGNOSIS — R601 Generalized edema: Secondary | ICD-10-CM | POA: Diagnosis not present

## 2020-04-21 DIAGNOSIS — D649 Anemia, unspecified: Secondary | ICD-10-CM | POA: Diagnosis not present

## 2020-04-21 DIAGNOSIS — R188 Other ascites: Secondary | ICD-10-CM | POA: Diagnosis not present

## 2020-04-22 DIAGNOSIS — R188 Other ascites: Secondary | ICD-10-CM | POA: Diagnosis not present

## 2020-04-22 DIAGNOSIS — D649 Anemia, unspecified: Secondary | ICD-10-CM | POA: Diagnosis not present

## 2020-04-22 DIAGNOSIS — R601 Generalized edema: Secondary | ICD-10-CM | POA: Diagnosis not present

## 2020-04-23 DIAGNOSIS — R188 Other ascites: Secondary | ICD-10-CM | POA: Diagnosis not present

## 2020-04-23 DIAGNOSIS — D649 Anemia, unspecified: Secondary | ICD-10-CM | POA: Diagnosis not present

## 2020-04-23 DIAGNOSIS — R601 Generalized edema: Secondary | ICD-10-CM | POA: Diagnosis not present

## 2020-04-24 DIAGNOSIS — R601 Generalized edema: Secondary | ICD-10-CM | POA: Diagnosis not present

## 2020-04-24 DIAGNOSIS — R188 Other ascites: Secondary | ICD-10-CM | POA: Diagnosis not present

## 2020-04-24 DIAGNOSIS — D649 Anemia, unspecified: Secondary | ICD-10-CM | POA: Diagnosis not present

## 2020-04-25 DIAGNOSIS — R601 Generalized edema: Secondary | ICD-10-CM | POA: Diagnosis not present

## 2020-04-25 DIAGNOSIS — D649 Anemia, unspecified: Secondary | ICD-10-CM | POA: Diagnosis not present

## 2020-04-25 DIAGNOSIS — R188 Other ascites: Secondary | ICD-10-CM | POA: Diagnosis not present

## 2020-04-26 ENCOUNTER — Telehealth: Payer: Self-pay

## 2020-04-26 DIAGNOSIS — D649 Anemia, unspecified: Secondary | ICD-10-CM | POA: Diagnosis not present

## 2020-04-26 DIAGNOSIS — R188 Other ascites: Secondary | ICD-10-CM | POA: Diagnosis not present

## 2020-04-26 DIAGNOSIS — R601 Generalized edema: Secondary | ICD-10-CM | POA: Diagnosis not present

## 2020-04-26 NOTE — Telephone Encounter (Signed)
Pt saw Gunnar Fusi last year in Oct. We have been trying to get her scheduled for a hospital procedure every since.  Dr. Adela Lank would like to see her back in the office for reassessment before scheduling. Called pt.  Her voicemail is full on cell phone. Home number is busy multiple times. Sent letter to pt to call office

## 2020-04-27 DIAGNOSIS — R188 Other ascites: Secondary | ICD-10-CM | POA: Diagnosis not present

## 2020-04-27 DIAGNOSIS — R601 Generalized edema: Secondary | ICD-10-CM | POA: Diagnosis not present

## 2020-04-27 DIAGNOSIS — D649 Anemia, unspecified: Secondary | ICD-10-CM | POA: Diagnosis not present

## 2020-05-12 NOTE — Telephone Encounter (Signed)
Called pt's home number again and the recording was in spanish so not sure that is the correct number. LM to call the office to schedule an OV  to discuss ECL at the hospital. Called pt's mobile #- unable to leave a message as the mailbox was full.  Will send patient another letter asking her to call the office to schedule an OV with Dr. Adela Lank for reassessment. Removed from hospital wait list.

## 2020-06-08 DIAGNOSIS — E876 Hypokalemia: Secondary | ICD-10-CM | POA: Diagnosis not present

## 2020-06-08 DIAGNOSIS — R079 Chest pain, unspecified: Secondary | ICD-10-CM | POA: Diagnosis not present

## 2020-06-09 DIAGNOSIS — R079 Chest pain, unspecified: Secondary | ICD-10-CM | POA: Diagnosis not present

## 2020-06-09 DIAGNOSIS — E876 Hypokalemia: Secondary | ICD-10-CM | POA: Diagnosis not present

## 2020-06-10 DIAGNOSIS — E876 Hypokalemia: Secondary | ICD-10-CM | POA: Diagnosis not present

## 2020-06-10 DIAGNOSIS — R079 Chest pain, unspecified: Secondary | ICD-10-CM | POA: Diagnosis not present

## 2020-06-11 DIAGNOSIS — R079 Chest pain, unspecified: Secondary | ICD-10-CM | POA: Diagnosis not present

## 2020-06-11 DIAGNOSIS — E876 Hypokalemia: Secondary | ICD-10-CM | POA: Diagnosis not present

## 2020-08-14 ENCOUNTER — Emergency Department (HOSPITAL_COMMUNITY): Payer: BC Managed Care – PPO

## 2020-08-14 ENCOUNTER — Other Ambulatory Visit: Payer: Self-pay

## 2020-08-14 ENCOUNTER — Emergency Department (HOSPITAL_COMMUNITY)
Admission: EM | Admit: 2020-08-14 | Discharge: 2020-08-14 | Disposition: A | Discharge status: Home / Self Care | Payer: BC Managed Care – PPO | Attending: Emergency Medicine | Admitting: Emergency Medicine

## 2020-08-14 DIAGNOSIS — I10 Essential (primary) hypertension: Secondary | ICD-10-CM | POA: Diagnosis not present

## 2020-08-14 DIAGNOSIS — R600 Localized edema: Secondary | ICD-10-CM

## 2020-08-14 DIAGNOSIS — E876 Hypokalemia: Secondary | ICD-10-CM

## 2020-08-14 DIAGNOSIS — R2243 Localized swelling, mass and lump, lower limb, bilateral: Secondary | ICD-10-CM | POA: Diagnosis present

## 2020-08-14 LAB — URINALYSIS, COMPLETE (UACMP) WITH MICROSCOPIC
Bilirubin Urine: NEGATIVE
Glucose, UA: NEGATIVE mg/dL
Ketones, ur: NEGATIVE mg/dL
Leukocytes,Ua: NEGATIVE
Nitrite: NEGATIVE
Protein, ur: NEGATIVE mg/dL
Specific Gravity, Urine: 1.003 — ABNORMAL LOW (ref 1.005–1.030)
pH: 6 (ref 5.0–8.0)

## 2020-08-14 LAB — CBC WITH DIFFERENTIAL/PLATELET
Abs Immature Granulocytes: 0.01 10*3/uL (ref 0.00–0.07)
Basophils Absolute: 0 10*3/uL (ref 0.0–0.1)
Basophils Relative: 0 %
Eosinophils Absolute: 0.3 10*3/uL (ref 0.0–0.5)
Eosinophils Relative: 5 %
HCT: 31.3 % — ABNORMAL LOW (ref 36.0–46.0)
Hemoglobin: 10.4 g/dL — ABNORMAL LOW (ref 12.0–15.0)
Immature Granulocytes: 0 %
Lymphocytes Relative: 20 %
Lymphs Abs: 1.1 10*3/uL (ref 0.7–4.0)
MCH: 34.7 pg — ABNORMAL HIGH (ref 26.0–34.0)
MCHC: 33.2 g/dL (ref 30.0–36.0)
MCV: 104.3 fL — ABNORMAL HIGH (ref 80.0–100.0)
Monocytes Absolute: 1 10*3/uL (ref 0.1–1.0)
Monocytes Relative: 18 %
Neutro Abs: 3.2 10*3/uL (ref 1.7–7.7)
Neutrophils Relative %: 57 %
Platelets: 62 10*3/uL — ABNORMAL LOW (ref 150–400)
RBC: 3 MIL/uL — ABNORMAL LOW (ref 3.87–5.11)
RDW: 16.5 % — ABNORMAL HIGH (ref 11.5–15.5)
WBC: 5.6 10*3/uL (ref 4.0–10.5)
nRBC: 0 % (ref 0.0–0.2)

## 2020-08-14 LAB — COMPREHENSIVE METABOLIC PANEL
ALT: 22 U/L (ref 0–44)
AST: 55 U/L — ABNORMAL HIGH (ref 15–41)
Albumin: 2.7 g/dL — ABNORMAL LOW (ref 3.5–5.0)
Alkaline Phosphatase: 97 U/L (ref 38–126)
Anion gap: 12 (ref 5–15)
BUN: <5 mg/dL — ABNORMAL LOW (ref 6–20)
CO2: 24 mmol/L (ref 22–32)
Calcium: 8.7 mg/dL — ABNORMAL LOW (ref 8.9–10.3)
Chloride: 95 mmol/L — ABNORMAL LOW (ref 98–111)
Creatinine, Ser: 0.59 mg/dL (ref 0.44–1.00)
GFR calc Af Amer: >60 mL/min (ref >60)
GFR calc non Af Amer: >60 mL/min (ref >60)
Glucose, Bld: 110 mg/dL — ABNORMAL HIGH (ref 70–99)
Potassium: 3 mmol/L — ABNORMAL LOW (ref 3.5–5.1)
Sodium: 131 mmol/L — ABNORMAL LOW (ref 135–145)
Total Bilirubin: 4 mg/dL — ABNORMAL HIGH (ref 0.3–1.2)
Total Protein: 6.9 g/dL (ref 6.5–8.1)

## 2020-08-14 LAB — BRAIN NATRIURETIC PEPTIDE: B Natriuretic Peptide: 166.5 pg/mL — ABNORMAL HIGH (ref 0.0–100.0)

## 2020-08-14 MED ORDER — FUROSEMIDE 10 MG/ML IJ SOLN
40.0000 mg | Freq: Once | INTRAMUSCULAR | Status: AC
  Administered 2020-08-14: 40 mg via INTRAVENOUS
  Filled 2020-08-14: qty 4

## 2020-08-14 MED ORDER — POTASSIUM CHLORIDE CRYS ER 20 MEQ PO TBCR: 20.0000 meq | EXTENDED_RELEASE_TABLET | Freq: Two times a day (BID) | ORAL | 2 refills | Status: AC

## 2020-08-14 MED ORDER — POTASSIUM CHLORIDE CRYS ER 20 MEQ PO TBCR
80.0000 meq | EXTENDED_RELEASE_TABLET | Freq: Once | ORAL | Status: AC
  Administered 2020-08-14: 80 meq via ORAL
  Filled 2020-08-14: qty 4

## 2020-08-14 MED ORDER — POTASSIUM CHLORIDE 10 MEQ/100ML IV SOLN
10.0000 meq | Freq: Once | INTRAVENOUS | Status: AC
  Administered 2020-08-14: 10 meq via INTRAVENOUS
  Filled 2020-08-14: qty 100

## 2020-08-14 MED ORDER — ONDANSETRON HCL 4 MG/2ML IJ SOLN
4.0000 mg | Freq: Once | INTRAMUSCULAR | Status: AC
  Administered 2020-08-14: 4 mg via INTRAVENOUS
  Filled 2020-08-14: qty 2

## 2020-08-14 MED ORDER — ALPRAZOLAM 0.25 MG PO TABS
0.5000 mg | ORAL_TABLET | Freq: Once | ORAL | Status: AC
  Administered 2020-08-14: 0.5 mg via ORAL
  Filled 2020-08-14: qty 2

## 2020-08-14 NOTE — ED Notes (Signed)
MD aware Pt wanted pain meds and was threatening to leave.

## 2020-08-14 NOTE — ED Provider Notes (Signed)
Emergency Department Provider Note  I have reviewed the triage vital signs and the nursing notes.  HISTORY  Chief Complaint Leg Swelling   HPI ANAM Tiffany Howell is a 49 y.o. female w/ h/o HTN, alcoholism, HLD and reported heart failure who presents here for leg pain.  Patient is not entirely honest in her history.  Initially states that she just left the hospital earlier today and her legs got worsening swelling but did not know why.  Subsequently she stated that she smoked crack for the first time tonight, ate a plate of spaghetti, drank a bunch of beer and did not pick up her medications.  She denies any shortness of breath or chest pain this time.  EMS brought her in she is initially hypertensive with them.  Mostly does complain about pain and swelling in her legs.  No fevers.  I reviewed the records it appears that she is actually positive for cocaine and marijuana when she was admitted to [facility].  She was intubated for hypercapnic respiratory failure and encephalopathy.  She was actually just discharged on the 18th.  She is on midodrine, Lasix amongst other medications.  Her EF there was 50 to 55%.  No other associated or modifying symptoms.    Past Medical History:  Diagnosis Date  . Alcoholism ([facility])   . Anxiety   . Depression   . Fatty liver   . Hypercholesteremia   . Hypertension     Patient Active Problem List   Diagnosis Date Noted  . Sepsis due to Escherichia coli (E. coli) ([facility]) 08/2019  . Psoas abscess ([facility]) 08/2019  . Vertebral osteomyelitis, acute ([facility]) 08/2019  . Alcohol abuse 07/02/2019  . Depression 07/02/2019  . Anxiety 07/02/2019  . Rhabdomyolysis 07/02/2019  . Acute metabolic encephalopathy 07/02/2019  . Acute respiratory failure with hypoxia ([facility]) 07/02/2019  . Hyponatremia 06/26/2019  . Alcohol withdrawal syndrome ([facility]) 02/13/2016  . Elevated MCV 02/13/2016  . Suicidal ideations 02/13/2016  . Sinus tachycardia 02/13/2016  . Elevated lactic  acid level 02/04/2015  . Nausea vomiting and diarrhea 02/04/2015  . Abdominal pain 02/04/2015  . Dizziness 02/04/2015  . Hypertension 02/04/2015  . Elevated LFTs 02/04/2015  . Hyperlipidemia 02/04/2015    No past surgical history on file.  Current Outpatient Rx  . Order #: [id number] Class: Historical Med  . Order #: [id number] Class: Historical Med  . Order #: [id number] Class: Historical Med  . Order #: [id number] Class: Historical Med  . Order #: [id number] Class: Historical Med  . Order #: [id number] Class: Historical Med  . Order #: [id number] Class: Historical Med  . Order #: [id number] Class: Historical Med  . Order #: [id number] Class: Historical Med  . Order #: [id number] Class: Historical Med  . Order #: [id number] Class: Historical Med  . Order #: [id number] Class: Normal  . Order #: [id number] Class: Print    Allergies Patient has no known allergies.  Family History  Problem Relation Age of Onset  . Diabetes Mellitus II Mother   . Hypertension Mother   . Heart failure Mother   . Mental illness Maternal Grandfather   . Mental illness Maternal Aunt     Social History Social History   Tobacco Use  . Smoking status: Never Smoker  . Smokeless tobacco: Never Used  Vaping Use  . Vaping Use: Never used  Substance Use Topics  . Alcohol use: Yes    Comment: occasional  . Drug use: No    Review of Systems  All other systems negative except as documented in the HPI.  All pertinent positives and negatives as reviewed in the HPI. ____________________________________________  PHYSICAL EXAM:  VITAL SIGNS: ED Triage Vitals  Enc Vitals Group     BP 08/14/20 0122 126/68     Pulse Rate 08/14/20 0122 93     Resp --      Temp 08/14/20 0122 99 F (37.2 C)     Temp Source 08/14/20 0122 Oral     SpO2 08/14/20 0122 98 %    Constitutional: Alert and oriented. Well appearing and in no acute distress. Eyes: Conjunctivae are normal. PERRL. EOMI. Head: Atraumatic. Nose: No  congestion/rhinnorhea. Mouth/Throat: Mucous membranes are moist.  Oropharynx non-erythematous. Neck: No stridor.  No meningeal signs.   Cardiovascular: Normal rate, regular rhythm. Good peripheral circulation. Grossly normal heart sounds.   Respiratory: Normal respiratory effort.  No retractions. Lungs CTAB. Gastrointestinal: Soft and nontender. No distention.  Musculoskeletal: pain to the skin of both lower legs with associated edema, erythematous as well. No gross deformities of extremities. Neurologic:  Normal speech and language. No gross focal neurologic deficits are appreciated.  Skin:  Skin is warm, dry and intact. No rash noted.  ____________________________________________   LABS (all labs ordered are listed, but only abnormal results are displayed)  Labs Reviewed  URINE CULTURE  COMPREHENSIVE METABOLIC PANEL  CBC WITH DIFFERENTIAL/PLATELET  BRAIN NATRIURETIC PEPTIDE  URINALYSIS, COMPLETE (UACMP) WITH MICROSCOPIC   ____________________________________________  EKG   EKG Interpretation  Date/Time:    Ventricular Rate:    PR Interval:    QRS Duration:   QT Interval:    QTC Calculation:   R Axis:     Text Interpretation:         ____________________________________________  RADIOLOGY  DG Chest Port 1 View  Result Date: 08/14/2020 CLINICAL DATA:  Left chest pain EXAM: PORTABLE CHEST 1 VIEW COMPARISON:  08/02/2020 FINDINGS: The heart size and mediastinal contours are within normal limits. Both lungs are clear. The visualized skeletal structures are unremarkable. IMPRESSION: No active disease. Electronically Signed   By: Helyn Numbers MD   On: 08/14/2020 02:09   ____________________________________________  PROCEDURES  Procedure(s) performed:   Procedures ____________________________________________  INITIAL IMPRESSION / ASSESSMENT AND PLAN / ED COURSE   This patient presents to the ED for concern of leg pain, this involves an extensive number of  treatment options, and is a complaint that carries with it a high risk of complications and morbidity.  The differential diagnosis includes cellulitis, venous stasis, edema.     Lab Tests:   I Ordered, reviewed, and interpreted labs, which included cmp with slightly low K, albumin, still high bilirubin (similar to recent hospitalization), cbc that was relatively unremarkable.  BNP was 166 which is consistent with severe heart failure.  Potassium repleted and will continue potassium repletion at home.  Medicines ordered:   I ordered medication oral and IV potassium for hypokalemia, Lasix for edema and Xanax which is one of her home medications.  Imaging Studies ordered:   I independently visualized and interpreted imaging chest x-ray which showed no significant abnormalities  Additional history obtained:   Additional history obtained from no one besides EMS  Previous records obtained and reviewed in epic and care everywhere  Consultations Obtained:   I consulted no one and discussed lab and imaging findings  Reevaluation:  After the interventions stated above, I reevaluated the patient and found comfortable and stable with normal vital signs  Critical Interventions: None  A medical screening exam was performed and I feel the patient has  had an appropriate workup for their chief complaint at this time and likelihood of emergent condition existing is low. They have been counseled on decision, discharge, follow up and which symptoms necessitate immediate return to the emergency department. They or their family verbally stated understanding and agreement with plan and discharged in stable condition.   ____________________________________________  FINAL CLINICAL IMPRESSION(S) / ED DIAGNOSES  Final diagnoses:  None    MEDICATIONS GIVEN DURING THIS VISIT:  Medications - No data to display  NEW OUTPATIENT MEDICATIONS STARTED DURING THIS VISIT:  New Prescriptions   No  medications on file    Note:  This note was prepared with assistance of Dragon voice recognition software. Occasional wrong-word or sound-a-like substitutions may have occurred due to the inherent limitations of voice recognition software.   Chiamaka Latka, Barbara Cower, MD 08/14/20 [phone]

## 2020-08-14 NOTE — Discharge Instructions (Addendum)
Please have your potassium rechecked in 1 week

## 2020-08-14 NOTE — ED Triage Notes (Signed)
Pt arrives from home via [facility] for worsening leg swelling. Pt was recently dc from [place] with same complaint. Pt states she is alcoholic and drank beer today and also did cocaine.

## 2020-08-14 NOTE — ED Notes (Addendum)
Pt verbalizes understanding of d/c instructions. Prescriptions reviewed with patient. Pt discharged to lobby with all belongings and in clean, dry scrubs. Attempted to contact pt's ride with no success. Pt will wait for ride in lobby.

## 2020-08-15 LAB — URINE CULTURE

## 2020-11-10 ENCOUNTER — Encounter: Payer: Self-pay | Admitting: Neurology

## 2021-02-03 ENCOUNTER — Ambulatory Visit: Payer: BC Managed Care – PPO | Admitting: Neurology

## 2021-05-18 ENCOUNTER — Other Ambulatory Visit (HOSPITAL_COMMUNITY): Payer: BC Managed Care – PPO

## 2021-05-18 ENCOUNTER — Inpatient Hospital Stay
Admission: RE | Admit: 2021-05-18 | Discharge: 2021-06-24 | Disposition: E | Payer: BC Managed Care – PPO | Source: Other Acute Inpatient Hospital | Attending: Internal Medicine | Admitting: Internal Medicine

## 2021-05-18 DIAGNOSIS — K567 Ileus, unspecified: Secondary | ICD-10-CM

## 2021-05-18 DIAGNOSIS — J189 Pneumonia, unspecified organism: Secondary | ICD-10-CM

## 2021-05-18 DIAGNOSIS — R34 Anuria and oliguria: Secondary | ICD-10-CM

## 2021-05-18 DIAGNOSIS — T17908A Unspecified foreign body in respiratory tract, part unspecified causing other injury, initial encounter: Secondary | ICD-10-CM

## 2021-05-18 DIAGNOSIS — R0602 Shortness of breath: Secondary | ICD-10-CM

## 2021-05-19 LAB — COMPREHENSIVE METABOLIC PANEL
ALT: 31 U/L (ref 0–44)
AST: 44 U/L — ABNORMAL HIGH (ref 15–41)
Albumin: 2.1 g/dL — ABNORMAL LOW (ref 3.5–5.0)
Alkaline Phosphatase: 86 U/L (ref 38–126)
Anion gap: 3 — ABNORMAL LOW (ref 5–15)
BUN: 39 mg/dL — ABNORMAL HIGH (ref 6–20)
CO2: 21 mmol/L — ABNORMAL LOW (ref 22–32)
Calcium: 8.2 mg/dL — ABNORMAL LOW (ref 8.9–10.3)
Chloride: 111 mmol/L (ref 98–111)
Creatinine, Ser: 0.61 mg/dL (ref 0.44–1.00)
GFR, Estimated: >60 mL/min (ref >60)
Glucose, Bld: 107 mg/dL — ABNORMAL HIGH (ref 70–99)
Potassium: 3.2 mmol/L — ABNORMAL LOW (ref 3.5–5.1)
Sodium: 135 mmol/L (ref 135–145)
Total Bilirubin: 3.7 mg/dL — ABNORMAL HIGH (ref 0.3–1.2)
Total Protein: 4 g/dL — ABNORMAL LOW (ref 6.5–8.1)

## 2021-05-19 LAB — CBC WITH DIFFERENTIAL/PLATELET
Abs Immature Granulocytes: 0.15 10*3/uL — ABNORMAL HIGH (ref 0.00–0.07)
Basophils Absolute: 0 10*3/uL (ref 0.0–0.1)
Basophils Relative: 0 %
Eosinophils Absolute: 0.1 10*3/uL (ref 0.0–0.5)
Eosinophils Relative: 2 %
HCT: 24.8 % — ABNORMAL LOW (ref 36.0–46.0)
Hemoglobin: 8.3 g/dL — ABNORMAL LOW (ref 12.0–15.0)
Immature Granulocytes: 3 %
Lymphocytes Relative: 19 %
Lymphs Abs: 0.9 10*3/uL (ref 0.7–4.0)
MCH: 31.8 pg (ref 26.0–34.0)
MCHC: 33.5 g/dL (ref 30.0–36.0)
MCV: 95 fL (ref 80.0–100.0)
Monocytes Absolute: 0.8 10*3/uL (ref 0.1–1.0)
Monocytes Relative: 18 %
Neutro Abs: 2.5 10*3/uL (ref 1.7–7.7)
Neutrophils Relative %: 58 %
Platelets: 34 10*3/uL — ABNORMAL LOW (ref 150–400)
RBC: 2.61 MIL/uL — ABNORMAL LOW (ref 3.87–5.11)
RDW: 17.8 % — ABNORMAL HIGH (ref 11.5–15.5)
WBC: 4.4 10*3/uL (ref 4.0–10.5)
nRBC: 0 % (ref 0.0–0.2)

## 2021-05-19 LAB — MAGNESIUM: Magnesium: 1.5 mg/dL — ABNORMAL LOW (ref 1.7–2.4)

## 2021-05-19 LAB — TSH: TSH: 3.721 u[IU]/mL (ref 0.350–4.500)

## 2021-05-19 LAB — PROTIME-INR
INR: 3.7 — ABNORMAL HIGH (ref 0.8–1.2)
Prothrombin Time: 36.8 seconds — ABNORMAL HIGH (ref 11.4–15.2)

## 2021-05-19 LAB — PHOSPHORUS: Phosphorus: 3.2 mg/dL (ref 2.5–4.6)

## 2021-05-20 LAB — POTASSIUM: Potassium: 3.5 mmol/L (ref 3.5–5.1)

## 2021-05-20 LAB — HEMOGLOBIN A1C
Hgb A1c MFr Bld: 5.1 % (ref 4.8–5.6)
Mean Plasma Glucose: 100 mg/dL

## 2021-05-21 ENCOUNTER — Other Ambulatory Visit (HOSPITAL_COMMUNITY): Payer: BC Managed Care – PPO

## 2021-05-21 LAB — BASIC METABOLIC PANEL
Anion gap: 8 (ref 5–15)
BUN: 39 mg/dL — ABNORMAL HIGH (ref 6–20)
CO2: 20 mmol/L — ABNORMAL LOW (ref 22–32)
Calcium: 8.2 mg/dL — ABNORMAL LOW (ref 8.9–10.3)
Chloride: 113 mmol/L — ABNORMAL HIGH (ref 98–111)
Creatinine, Ser: 0.78 mg/dL (ref 0.44–1.00)
GFR, Estimated: >60 mL/min (ref >60)
Glucose, Bld: 163 mg/dL — ABNORMAL HIGH (ref 70–99)
Potassium: 3.8 mmol/L (ref 3.5–5.1)
Sodium: 141 mmol/L (ref 135–145)

## 2021-05-21 LAB — BLOOD GAS, ARTERIAL
Acid-base deficit: 3.4 mmol/L — ABNORMAL HIGH (ref 0.0–2.0)
Bicarbonate: 20.6 mmol/L (ref 20.0–28.0)
Drawn by: 164
FIO2: 21
O2 Saturation: 99.1 %
Patient temperature: 36.4
pCO2 arterial: 33.5 mmHg (ref 32.0–48.0)
pH, Arterial: 7.403 (ref 7.350–7.450)
pO2, Arterial: 125 mmHg — ABNORMAL HIGH (ref 83.0–108.0)

## 2021-05-21 LAB — AMMONIA: Ammonia: 72 umol/L — ABNORMAL HIGH (ref 9–35)

## 2021-05-21 LAB — CBC
HCT: 27 % — ABNORMAL LOW (ref 36.0–46.0)
Hemoglobin: 8.7 g/dL — ABNORMAL LOW (ref 12.0–15.0)
MCH: 31.5 pg (ref 26.0–34.0)
MCHC: 32.2 g/dL (ref 30.0–36.0)
MCV: 97.8 fL (ref 80.0–100.0)
Platelets: 27 10*3/uL — CL (ref 150–400)
RBC: 2.76 MIL/uL — ABNORMAL LOW (ref 3.87–5.11)
RDW: 18.3 % — ABNORMAL HIGH (ref 11.5–15.5)
WBC: 5.8 10*3/uL (ref 4.0–10.5)
nRBC: 0 % (ref 0.0–0.2)

## 2021-05-21 LAB — MAGNESIUM
Magnesium: 1.5 mg/dL — ABNORMAL LOW (ref 1.7–2.4)
Magnesium: 2.2 mg/dL (ref 1.7–2.4)

## 2021-05-21 LAB — LACTIC ACID, PLASMA: Lactic Acid, Venous: 1.7 mmol/L (ref 0.5–1.9)

## 2021-05-21 NOTE — Consult Note (Signed)
Infectious Disease Consultation   Tiffany BoltStephanie C Howell  ZOX:[id number]  DOB: 06-15-1971  DOA: 05/20/2021  Requesting physician: Dr. Manson PasseyBrown  Reason for consultation: Antibiotic recommendations   History of Present Illness: Tiffany BoltStephanie C Howell is an 50 y.o. female with medical history significant of continuous alcohol abuse, liver cirrhosis, diastolic congestive heart failure, obesity, myocardial infarction, past history of polysubstance abuse, marijuana and history of cocaine abuse with multiple admissions to the hospital was admitted to [facility] on 05/03/2021 until 05/24/2021.  Patient apparently was recently discharged from the hospital on 04/28/2021 when she was admitted with encephalopathy and sepsis with E. coli bacteremia, osteomyelitis and large sacral decubitus ulcer.  She underwent debridement on 04/16/2021 and a wound VAC was placed.  She apparently was supposed to be on IV antibiotics for a total duration of 6 weeks.  The patient however was sent back from the nursing home to the emergency room for severe thrombocytopenia.  In the ED patient was found to have a platelet count of 27,000.  It was thought that the thrombocytopenia was secondary to liver cirrhosis, acutely dropped from antibiotics as well as sepsis.  She had bacteremia with Acinetobacter and received treatment with IV gentamicin.  During the hospital stay she was seen by infectious disease.  Medications were adjusted frequently because of the thrombocytopenia.  She was started on IV vancomycin, meropenem, micafungin.  Infectious disease was following.  They recommended discontinuing the micafungin and continuing with IV vancomycin, IV doxycycline.  Her wound cultures were positive for MRSA.  Blood cultures were positive for coagulase-negative staph which was felt to be contaminant.  She also had hyperkalemia and had been on Aldactone and potassium supplements at home.  The hyperkalemia was corrected  during the hospital stay.  She also had hyponatremia which was thought to be secondary to liver cirrhosis.  She was on fluid restriction per nephrology.  Patient was also noted to have acute on chronic blood loss and was bleeding from her rectum.  She was seen by GI, EGD revealed nonbleeding esophageal ulcers.  Patient had to receive 2 units of packed PRBCs on 05/16/2021.  She also had encephalopathy during her hospital stay with intermittent confusion.  She became hypotensive during the hospital stay and required Levophed drip.  Due to her complex medical problems she was transferred and admitted to [facility].  She is confused.  Remains encephalopathic.  Review of Systems:  ROS She has encephalopathy, confused.  Unable to obtain review of systems.   Past Medical History: Past Medical History:  Diagnosis Date  . Alcoholism (HCC)   . Anxiety   . Depression   . Fatty liver   . Hypercholesteremia   . Hypertension     Past Surgical History: History of back surgery  Allergies:  No Known Allergies   Social History:  reports that she has never smoked. She has never used smokeless tobacco. She reports current alcohol use.  Prior history of polysubstance abuse.   Family History: Family History  Problem Relation Age of Onset  . Diabetes Mellitus II Mother   . Hypertension Mother   . Heart failure Mother   . Mental illness Maternal Grandfather   . Mental illness Maternal Aunt    Physical Exam: Vitals: Temperature 96.9, pulse 89, respiratory rate 22, blood pressure 112/57, oxygen saturation 100%  Constitutional: Ill-appearing female, confused Head: Atraumatic, normocephalic Eyes: PERLA, EOMI, irises appear normal, scleral icterus ENMT:  external ears and nose appear normal, normal hearing, Lips appears normal, moist oral mucosa  Neck: Supple CVS: S1-S2  Respiratory: Decreased breath sound lower lobes, no rhonchi or wheezing Abdomen: Obese, distended, positive bowel  sounds Musculoskeletal: Bilateral lower extremity edema Neuro: Confused, debility with generalized weakness Psych: Confused Skin: Stage IV sacrococcygeal pressure ulcer, left ischium unstageable pressure injury, no new rashes  Data reviewed:  I have personally reviewed following labs and imaging studies Labs:  CBC: Recent Labs  Lab 05/19/21 0403 05/21/21 0220  WBC 4.4 5.8  NEUTROABS 2.5  --   HGB 8.3* 8.7*  HCT 24.8* 27.0*  MCV 95.0 97.8  PLT 34* 27*    Basic Metabolic Panel: Recent Labs  Lab 05/19/21 0403 05/20/21 1217 05/21/21 0220  NA 135  --  141  K 3.2* 3.5 3.8  CL 111  --  113*  CO2 21*  --  20*  GLUCOSE 107*  --  163*  BUN 39*  --  39*  CREATININE 0.61  --  0.78  CALCIUM 8.2*  --  8.2*  MG 1.5*  --  1.5*  PHOS 3.2  --   --    GFR CrCl cannot be calculated (Unknown ideal weight.). Liver Function Tests: Recent Labs  Lab 05/19/21 0403  AST 44*  ALT 31  ALKPHOS 86  BILITOT 3.7*  PROT 4.0*  ALBUMIN 2.1*   No results for input(s): LIPASE, AMYLASE in the last 168 hours. No results for input(s): AMMONIA in the last 168 hours. Coagulation profile Recent Labs  Lab 05/19/21 0403  INR 3.7*    Cardiac Enzymes: No results for input(s): CKTOTAL, CKMB, CKMBINDEX, TROPONINI in the last 168 hours. BNP: Invalid input(s): POCBNP CBG: No results for input(s): GLUCAP in the last 168 hours. D-Dimer No results for input(s): DDIMER in the last 72 hours. Hgb A1c Recent Labs    05/19/21 0403  HGBA1C 5.1   Lipid Profile No results for input(s): CHOL, HDL, LDLCALC, TRIG, CHOLHDL, LDLDIRECT in the last 72 hours. Thyroid function studies Recent Labs    05/19/21 0403  TSH 3.721   Anemia work up No results for input(s): VITAMINB12, FOLATE, FERRITIN, TIBC, IRON, RETICCTPCT in the last 72 hours. Urinalysis    Component Value Date/Time   COLORURINE YELLOW 08/14/2020 0430   APPEARANCEUR CLEAR 08/14/2020 0430   LABSPEC 1.003 (L) 08/14/2020 0430   PHURINE 6.0  08/14/2020 0430   GLUCOSEU NEGATIVE 08/14/2020 0430   HGBUR MODERATE (A) 08/14/2020 0430   BILIRUBINUR NEGATIVE 08/14/2020 0430   KETONESUR NEGATIVE 08/14/2020 0430   PROTEINUR NEGATIVE 08/14/2020 0430   UROBILINOGEN 0.2 01/30/2015 2338   NITRITE NEGATIVE 08/14/2020 0430   LEUKOCYTESUR NEGATIVE 08/14/2020 0430     Sepsis Labs Invalid input(s): PROCALCITONIN,  WBC,  LACTICIDVEN Microbiology No results found for this or any previous visit (from the past 240 hour(s)).  Inpatient Medications:   Please see MAR  Radiological Exams on Admission: US RENAL  Result Date: 05/21/2021 CLINICAL DATA:  Decreased urine production EXAM: RENAL / URINARY TRACT ULTRASOUND COMPLETE COMPARISON:  February 21, 2021 FINDINGS: Limited evaluation secondary to body habitus and patient immobility. Right Kidney: Renal measurements: 12.0 x 5.8 x 5.7 cm = volume: 200 an in mL. Echogenicity within normal limits. No mass or hydronephrosis visualized. Left Kidney: Limited evaluation due to limited sonographic windows, body habitus and patient immobility. Renal measurements: 9.3 x 4.7 x 5.3 cm = volume: 122 mL. Echogenicity within normal limits. No hydronephrosis visualized. Bladder: Decompressed around a Foley catheter.  Other: LEFT-sided ascites. IMPRESSION: 1. No hydronephrosis. 2. Small volume ascites. Electronically Signed   By: Meda Klinefelter MD   On: 05/21/2021 15:56   DG CHEST PORT 1 VIEW  Result Date: 05/21/2021 CLINICAL DATA:  Short of breath. EXAM: PORTABLE CHEST 1 VIEW COMPARISON:  06/03/21 and older studies. FINDINGS: Cardiac silhouette normal in size. Mild lung base densities, likely atelectasis. Possible small effusions. Remainder of the lungs is clear. No pneumothorax. Left-sided PICC has its tip projecting superiorly in the superior vena cava, stable from the previous exam. IMPRESSION: 1. No acute findings. 2. Mild lung base opacities consistent with atelectasis. No convincing pneumonia or pulmonary  edema. 3. Left PICC is again noted to have its tip pointed cephalad. Consider repositioning. Electronically Signed   By: Amie Portland M.D.   On: 05/21/2021 16:39    Impression/Recommendations Active Problems: Sacral osteomyelitis status post debridement Sepsis/bacteremia with Acinetobacter Stage IV sacrococcygeal pressure ulcer, left ischium unstageable pressure injury Acute on chronic renal failure Alcoholic liver cirrhosis with ascites Anasarca with volume overload Acute on chronic diastolic CHF Encephalopathy Thrombocytopenia Acute blood loss anemia status post PRBCs History of anxiety/bipolar disorder Aortic stenosis  Sacral osteomyelitis: Patient is status post debridement at the acute facility.  She received multiple antibiotics at the acute facility.  She is currently on treatment with IV meropenem, doxycycline.  Tentative end date for the meropenem would be 05/27/2021 and for the doxycycline 06/01/2021.  This will be approximately 4 weeks of IV antibiotics.  Continue local wound care.  She unfortunately has encephalopathy from liver cirrhosis and debility with decreased mobility therefore high risk for worsening of the pressure ulcer despite treatment.  Please monitor BUN/trending closely while on antibiotics.  Sepsis/bacteremia with Acinetobacter: Patient apparently had sepsis and bacteremia with Acinetobacter at the acute facility.  She was also on pressors at the acute facility briefly.  She was treated with IV gentamicin at the acute facility.  Currently on IV meropenem, doxycycline.  She is high risk for recurrent sepsis because of her liver failure.  If she starts having any worsening fevers or worsening leukocytosis suggest to send for pan cultures.  Stage IV sacrococcygeal pressure ulcer, left ischium unstageable pressure injury: She is status post debridement of the sacrococcygeal pressure ulcer.  She has left ischium unstageable pressure injury.  On antibiotics as mentioned  above.  Continue local wound care.  If the wounds are worsening suggest consulting surgery for further debridement.  Unfortunately because of her decreased mobility, unable to offload she is high risk for further skin breakdown and worsening of the ulcers.  Please monitor BUN/creatinine closely while on antibiotics.  Acute on chronic renal failure/metabolic acidosis: Patient unfortunately having acute on chronic renal failure.  She is also high risk for developing hepatorenal syndrome because of the liver cirrhosis.  She was given bicarb.  Please monitor BUN/trending closely while on antibiotics.  Further management per primary team.  If worsening consider consulting nephrology.  Avoid nephrotoxic medications.  Alcoholic liver cirrhosis with ascites: Patient on thiamine, folate, multivitamin.  On lactulose.  She is high risk for recurrent ascites.  Worsening consider drainage and consider sending fluid for analysis as she is also high risk for SBP.  Currently on antibiotics as mentioned above.  Further management per primary team.  Anasarca with volume overload/diastolic congestive heart failure: Continue medications and management per the primary team.  Anasarca is multifactorial etiology likely secondary to the liver cirrhosis, decreased oncotic pressure from hypoalbuminemia, diastolic congestive heart failure.  On  Lasix.  Encephalopathy: Likely toxic/metabolic.  High risk for hepatic encephalopathy.  On lactulose.  Continue supportive management per primary team.  Thrombocytopenia: She has thrombocytopenia likely from the liver cirrhosis.  Antibiotics can also cause thrombocytopenia.  However, she needs antibiotics for the sacral osteomyelitis.  Continue to monitor platelet count closely.  Acute blood loss anemia status post PRBCs: Patient apparently had a nonbleeding ulcer on the EGD at the acute facility.  She received 2 units PRBCs at the acute facility.  Currently on Protonix.  Further  investigation and management per the primary team.  History of anxiety/bipolar disorder: Continue medication and management per primary team.  Aortic stenosis: continue medications per the primary team.  Unfortunately due to her multiple complex medical problems she is very high risk for worsening and decompensation.  Plan of care discussed with the primary team and pharmacy.  Thank you for this consultation.    Vonzella Nipple M.D. 05/21/2021, 5:01 PM

## 2021-05-22 LAB — BASIC METABOLIC PANEL
Anion gap: 7 (ref 5–15)
BUN: 40 mg/dL — ABNORMAL HIGH (ref 6–20)
CO2: 18 mmol/L — ABNORMAL LOW (ref 22–32)
Calcium: 8.2 mg/dL — ABNORMAL LOW (ref 8.9–10.3)
Chloride: 117 mmol/L — ABNORMAL HIGH (ref 98–111)
Creatinine, Ser: 1.04 mg/dL — ABNORMAL HIGH (ref 0.44–1.00)
GFR, Estimated: >60 mL/min (ref >60)
Glucose, Bld: 96 mg/dL (ref 70–99)
Potassium: 3.4 mmol/L — ABNORMAL LOW (ref 3.5–5.1)
Sodium: 142 mmol/L (ref 135–145)

## 2021-05-22 LAB — CBC
HCT: 26.8 % — ABNORMAL LOW (ref 36.0–46.0)
HCT: 27.1 % — ABNORMAL LOW (ref 36.0–46.0)
Hemoglobin: 9.1 g/dL — ABNORMAL LOW (ref 12.0–15.0)
Hemoglobin: 8.9 g/dL — ABNORMAL LOW (ref 12.0–15.0)
MCH: 32.3 pg (ref 26.0–34.0)
MCH: 31.3 pg (ref 26.0–34.0)
MCHC: 34 g/dL (ref 30.0–36.0)
MCHC: 32.8 g/dL (ref 30.0–36.0)
MCV: 95 fL (ref 80.0–100.0)
MCV: 95.4 fL (ref 80.0–100.0)
Platelets: 5 10*3/uL — CL (ref 150–400)
Platelets: 29 10*3/uL — CL (ref 150–400)
RBC: 2.82 MIL/uL — ABNORMAL LOW (ref 3.87–5.11)
RBC: 2.84 MIL/uL — ABNORMAL LOW (ref 3.87–5.11)
RDW: 18.4 % — ABNORMAL HIGH (ref 11.5–15.5)
RDW: 18.5 % — ABNORMAL HIGH (ref 11.5–15.5)
WBC: 8.6 10*3/uL (ref 4.0–10.5)
WBC: 8.8 10*3/uL (ref 4.0–10.5)
nRBC: 0.8 % — ABNORMAL HIGH (ref 0.0–0.2)
nRBC: 0 % (ref 0.0–0.2)

## 2021-05-22 LAB — PROTIME-INR
INR: 3.1 — ABNORMAL HIGH (ref 0.8–1.2)
Prothrombin Time: 32.2 seconds — ABNORMAL HIGH (ref 11.4–15.2)

## 2021-05-22 LAB — ABO/RH: ABO/RH(D): A POS

## 2021-05-23 LAB — BASIC METABOLIC PANEL
Anion gap: 4 — ABNORMAL LOW (ref 5–15)
BUN: 41 mg/dL — ABNORMAL HIGH (ref 6–20)
CO2: 22 mmol/L (ref 22–32)
Calcium: 8.4 mg/dL — ABNORMAL LOW (ref 8.9–10.3)
Chloride: 119 mmol/L — ABNORMAL HIGH (ref 98–111)
Creatinine, Ser: 1.17 mg/dL — ABNORMAL HIGH (ref 0.44–1.00)
GFR, Estimated: 56 mL/min — ABNORMAL LOW (ref >60)
Glucose, Bld: 129 mg/dL — ABNORMAL HIGH (ref 70–99)
Potassium: 3.6 mmol/L (ref 3.5–5.1)
Sodium: 145 mmol/L (ref 135–145)

## 2021-05-23 LAB — CBC
HCT: 25.4 % — ABNORMAL LOW (ref 36.0–46.0)
Hemoglobin: 8.3 g/dL — ABNORMAL LOW (ref 12.0–15.0)
MCH: 31.6 pg (ref 26.0–34.0)
MCHC: 32.7 g/dL (ref 30.0–36.0)
MCV: 96.6 fL (ref 80.0–100.0)
Platelets: 25 10*3/uL — CL (ref 150–400)
RBC: 2.63 MIL/uL — ABNORMAL LOW (ref 3.87–5.11)
RDW: 18.4 % — ABNORMAL HIGH (ref 11.5–15.5)
WBC: 6.4 10*3/uL (ref 4.0–10.5)
nRBC: 0.3 % — ABNORMAL HIGH (ref 0.0–0.2)

## 2021-05-23 LAB — BPAM FFP
Blood Product Expiration Date: 202205292359
ISSUE DATE / TIME: 202205291256
Unit Type and Rh: 6200

## 2021-05-23 LAB — BPAM PLATELET PHERESIS
Blood Product Expiration Date: 202205312359
ISSUE DATE / TIME: 202205291118
Unit Type and Rh: 6200

## 2021-05-23 LAB — PREPARE FRESH FROZEN PLASMA: Unit division: 0

## 2021-05-23 LAB — PREPARE PLATELET PHERESIS: Unit division: 0

## 2021-05-23 LAB — MAGNESIUM: Magnesium: 2.2 mg/dL (ref 1.7–2.4)

## 2021-05-23 LAB — AMMONIA: Ammonia: 46 umol/L — ABNORMAL HIGH (ref 9–35)

## 2021-05-23 NOTE — Consult Note (Signed)
[facility] CONSULT NOTE    Date: 05/23/2021                  Patient Name:  Tiffany Howell  MRN: [id number]  DOB: 1971-12-13  Age / Sex: 50 y.o., female         PCP: Waldon Reining, DO                 Service Requesting Consult:  Hospitalist                 Reason for Consult:  Acute kidney injury/oliguria            History of Present Illness: Patient is a 51 y.o. female with a PMHx of liver cirrhosis, ascites, generalized debility, cervical spine stenosis status post C4-5 ACDF 01/24/2021, quadriparesis, central cord syndrome, coagulopathy, chronic thrombocytopenia, hepatic encephalopathy, history of recurrent UTI, bipolar disorder, anxiety, who was admitted to [facility] on 05/23/2021 for ongoing care.  Patient awake but confused and unable to offer significant history regarding present illness.  We are asked to see the patient for oliguric acute kidney injury.  Upon presentation the patient's creatinine was 0.61.  It has now risen to 1.17.  BUN also up to 41.  Urine output was only 175 cc over the preceding 24 hours.  She is currently maintained on doxycycline and meropenem for sacral osteomyelitis.  Patient also noted to be on furosemide at this time.   Medications: Outpatient medications: Medications Prior to Admission  Medication Sig Dispense Refill Last Dose  . B Complex-C (B-COMPLEX WITH VITAMIN C) tablet Take 1 tablet by mouth daily.     . famotidine (PEPCID) 20 MG tablet Take 20 mg by mouth 2 (two) times daily.     . furosemide (LASIX) 20 MG tablet Take 20 mg by mouth daily.     Marland Kitchen lactulose (CHRONULAC) 10 GM/15ML solution Take 45 g by mouth 3 (three) times daily as needed for mild constipation.      . magnesium oxide (MAG-OX) 400 MG tablet Take 400 mg by mouth 2 (two) times daily.      . midodrine (PROAMATINE) 5 MG tablet Take 5 mg by mouth 3 (three) times daily with meals.     . potassium chloride SA (KLOR-CON) 20 MEQ tablet Take 1  tablet (20 mEq total) by mouth 2 (two) times daily. 60 tablet 2   . QUEtiapine (SEROQUEL) 50 MG tablet Take 50 mg by mouth at bedtime.     . rifaximin (XIFAXAN) 550 MG TABS tablet Take 550 mg by mouth 2 (two) times daily.     Marland Kitchen spironolactone (ALDACTONE) 25 MG tablet Take 25 mg by mouth daily.     Marland Kitchen thiamine 100 MG tablet Take 100 mg by mouth daily.     Marland Kitchen tiZANidine (ZANAFLEX) 2 MG tablet Take 2 mg by mouth every 8 (eight) hours as needed for muscle spasms.       Current medications: Replace meropenem 1 g IV every 8 hours, sodium bicarb and drip, baclofen 5 mg twice daily, buspirone 5 mg twice daily, calcium carbonate 500 mg every morning, doxycycline 100 mg twice daily, ferrous sulfate 324 mg every morning, folic acid 1 mg daily, furosemide 40 mg daily, lactulose 45 cc 4 times daily, magnesium oxide 800 mg twice daily, metoprolol 25 mg daily, Protonix 40 mg daily, quetiapine 25 mg nightly, Flomax 0.4 mg daily, thiamine 100 mg daily, vitamin C 500 mg daily, vitamin D 1000 units  p.o. daily, zinc sulfate 220 mg daily   Allergies: No Known Allergies    Past Medical History: Past Medical History:  Diagnosis Date  . Alcoholism (HCC)   . Anxiety   . Depression   . Fatty liver   . Hypercholesteremia   . Hypertension      Past Surgical History: C4-5 ACDF.  Family History: Family History  Problem Relation Age of Onset  . Diabetes Mellitus II Mother   . Hypertension Mother   . Heart failure Mother   . Mental illness Maternal Grandfather   . Mental illness Maternal Aunt      Social History: Social History   Socioeconomic History  . Marital status: Single    Spouse name: Not on file  . Number of children: Not on file  . Years of education: Not on file  . Highest education level: Not on file  Occupational History  . Not on file  Tobacco Use  . Smoking status: Never Smoker  . Smokeless tobacco: Never Used  Vaping Use  . Vaping Use: Never used  Substance and Sexual Activity   . Alcohol use: Yes    Comment: occasional  . Drug use: No  . Sexual activity: Yes    Birth control/protection: I.U.D.  Other Topics Concern  . Not on file  Social History Narrative  . Not on file   Social Determinants of Health   Financial Resource Strain: Not on file  Food Insecurity: Not on file  Transportation Needs: Not on file  Physical Activity: Not on file  Stress: Not on file  Social Connections: Not on file  Intimate Partner Violence: Not on file     Review of Systems: As per HPI  Vital Signs: Temperature 96.1 pulse 72 respirations 10 blood pressure 102/43  Weight trends: There were no vitals filed for this visit.  Physical Exam: Physical Exam: General:  No acute distress  Head:  Normocephalic, atraumatic. Moist oral mucosal membranes  Eyes:  Anicteric  Neck:  Supple  Lungs:   Clear to auscultation, normal effort  Heart:  S1S2 no rubs  Abdomen:   Soft, nontender, bowel sounds present, distended  Extremities:  3+ peripheral edema.  Neurologic:  Awake, alert, confused, follows simple commands  Skin:  No acute skin rash  Access:  No hemodialysis access    Lab results: Basic Metabolic Panel: Recent Labs  Lab 05/19/21 0403 05/20/21 1217 05/21/21 0220 05/21/21 1646 05/22/21 0703 05/23/21 0247  NA 135  --  141  --  142 145  K 3.2*   < > 3.8  --  3.4* 3.6  CL 111  --  113*  --  117* 119*  CO2 21*  --  20*  --  18* 22  GLUCOSE 107*  --  163*  --  96 129*  BUN 39*  --  39*  --  40* 41*  CREATININE 0.61  --  0.78  --  1.04* 1.17*  CALCIUM 8.2*  --  8.2*  --  8.2* 8.4*  MG 1.5*  --  1.5* 2.2  --  2.2  PHOS 3.2  --   --   --   --   --    < > = values in this interval not displayed.    Liver Function Tests: Recent Labs  Lab 05/19/21 0403  AST 44*  ALT 31  ALKPHOS 86  BILITOT 3.7*  PROT 4.0*  ALBUMIN 2.1*   No results for input(s): LIPASE, AMYLASE in the last  168 hours. Recent Labs  Lab 05/21/21 1646 05/23/21 0247  AMMONIA 72* 46*     CBC: Recent Labs  Lab 05/19/21 0403 05/21/21 0220 05/22/21 0703 05/22/21 1650 05/23/21 0247  WBC 4.4 5.8 8.6 8.8 6.4  NEUTROABS 2.5  --   --   --   --   HGB 8.3* 8.7* 9.1* 8.9* 8.3*  HCT 24.8* 27.0* 26.8* 27.1* 25.4*  MCV 95.0 97.8 95.0 95.4 96.6  PLT 34* 27* 5* 29* 25*    Cardiac Enzymes: No results for input(s): CKTOTAL, CKMB, CKMBINDEX, TROPONINI in the last 168 hours.  BNP: Invalid input(s): POCBNP  CBG: No results for input(s): GLUCAP in the last 168 hours.  Microbiology: Results for orders placed or performed during the hospital encounter of 08/14/20  Urine culture     Status: Abnormal   Collection Time: 08/14/20  4:30 AM   Specimen: Urine, Random  Result Value Ref Range Status   Specimen Description URINE, RANDOM  Final   Special Requests   Final    NONE Performed at [facility], [address], [place], [place] [address]    Culture MULTIPLE SPECIES PRESENT, SUGGEST RECOLLECTION (A)  Final   Report Status 08/15/2020 FINAL  Final    Coagulation Studies: Recent Labs    05/22/21 0703  LABPROT 32.2*  INR 3.1*    Urinalysis: No results for input(s): COLORURINE, LABSPEC, PHURINE, GLUCOSEU, HGBUR, BILIRUBINUR, KETONESUR, PROTEINUR, UROBILINOGEN, NITRITE, LEUKOCYTESUR in the last 72 hours.  Invalid input(s): APPERANCEUR    Imaging: US RENAL  Result Date: 05/21/2021 CLINICAL DATA:  Decreased urine production EXAM: RENAL / URINARY TRACT ULTRASOUND COMPLETE COMPARISON:  February 21, 2021 FINDINGS: Limited evaluation secondary to body habitus and patient immobility. Right Kidney: Renal measurements: 12.0 x 5.8 x 5.7 cm = volume: 200 an in mL. Echogenicity within normal limits. No mass or hydronephrosis visualized. Left Kidney: Limited evaluation due to limited sonographic windows, body habitus and patient immobility. Renal measurements: 9.3 x 4.7 x 5.3 cm = volume: 122 mL. Echogenicity within normal limits. No hydronephrosis visualized. Bladder:  Decompressed around a Foley catheter. Other: LEFT-sided ascites. IMPRESSION: 1. No hydronephrosis. 2. Small volume ascites. Electronically Signed   By: Meda Klinefelter MD   On: 05/21/2021 15:56   DG CHEST PORT 1 VIEW  Result Date: 05/21/2021 CLINICAL DATA:  Short of breath. EXAM: PORTABLE CHEST 1 VIEW COMPARISON:  Jun 02, 2021 and older studies. FINDINGS: Cardiac silhouette normal in size. Mild lung base densities, likely atelectasis. Possible small effusions. Remainder of the lungs is clear. No pneumothorax. Left-sided PICC has its tip projecting superiorly in the superior vena cava, stable from the previous exam. IMPRESSION: 1. No acute findings. 2. Mild lung base opacities consistent with atelectasis. No convincing pneumonia or pulmonary edema. 3. Left PICC is again noted to have its tip pointed cephalad. Consider repositioning. Electronically Signed   By: Amie Portland M.D.   On: 05/21/2021 16:39      Assessment & Plan: Pt is a 50 y.o. female with a PMHx of liver cirrhosis, ascites, generalized debility, cervical spine stenosis status post C4-5 ACDF 01/24/2021, quadriparesis, central cord syndrome, coagulopathy, chronic thrombocytopenia, hepatic encephalopathy, history of recurrent UTI, bipolar disorder, anxiety, who was admitted to [facility] on 06-02-2021 for ongoing care.   1.  Acute kidney injury with oliguria. 2.  Liver cirrhosis. 3.  Chronic thrombocytopenia.  Plan: We are asked to see the patient for evaluation management of acute kidney injury with oliguria.  She may have some element  of intravascular volume depletion.  Renal ultrasound was negative for hydronephrosis.  Patient noted to be on Lasix which we will hold for now.  Start the patient on albumin 25 g IV every 8 hours.  No immediate need for dialysis however if oliguria should persist we may need to consider this.  Overall prognosis remains guarded given multiple comorbidities and generalized debility status.  Thanks for  consultation.

## 2021-05-24 LAB — CBC
HCT: 23.6 % — ABNORMAL LOW (ref 36.0–46.0)
Hemoglobin: 7.7 g/dL — ABNORMAL LOW (ref 12.0–15.0)
MCH: 31.4 pg (ref 26.0–34.0)
MCHC: 32.6 g/dL (ref 30.0–36.0)
MCV: 96.3 fL (ref 80.0–100.0)
Platelets: 27 10*3/uL — CL (ref 150–400)
RBC: 2.45 MIL/uL — ABNORMAL LOW (ref 3.87–5.11)
RDW: 18.6 % — ABNORMAL HIGH (ref 11.5–15.5)
WBC: 11.3 10*3/uL — ABNORMAL HIGH (ref 4.0–10.5)
nRBC: 0.3 % — ABNORMAL HIGH (ref 0.0–0.2)

## 2021-05-24 LAB — BASIC METABOLIC PANEL
Anion gap: 8 (ref 5–15)
BUN: 43 mg/dL — ABNORMAL HIGH (ref 6–20)
CO2: 21 mmol/L — ABNORMAL LOW (ref 22–32)
Calcium: 8.3 mg/dL — ABNORMAL LOW (ref 8.9–10.3)
Chloride: 115 mmol/L — ABNORMAL HIGH (ref 98–111)
Creatinine, Ser: 1.43 mg/dL — ABNORMAL HIGH (ref 0.44–1.00)
GFR, Estimated: 44 mL/min — ABNORMAL LOW (ref >60)
Glucose, Bld: 120 mg/dL — ABNORMAL HIGH (ref 70–99)
Potassium: 3.6 mmol/L (ref 3.5–5.1)
Sodium: 144 mmol/L (ref 135–145)

## 2021-05-25 LAB — BASIC METABOLIC PANEL
Anion gap: 11 (ref 5–15)
BUN: 47 mg/dL — ABNORMAL HIGH (ref 6–20)
CO2: 23 mmol/L (ref 22–32)
Calcium: 9 mg/dL (ref 8.9–10.3)
Chloride: 113 mmol/L — ABNORMAL HIGH (ref 98–111)
Creatinine, Ser: 1.54 mg/dL — ABNORMAL HIGH (ref 0.44–1.00)
GFR, Estimated: 41 mL/min — ABNORMAL LOW (ref >60)
Glucose, Bld: 132 mg/dL — ABNORMAL HIGH (ref 70–99)
Potassium: 3.9 mmol/L (ref 3.5–5.1)
Sodium: 147 mmol/L — ABNORMAL HIGH (ref 135–145)

## 2021-05-25 LAB — AMMONIA: Ammonia: 31 umol/L (ref 9–35)

## 2021-05-25 LAB — PROTIME-INR
INR: 3.9 — ABNORMAL HIGH (ref 0.8–1.2)
Prothrombin Time: 38.2 seconds — ABNORMAL HIGH (ref 11.4–15.2)

## 2021-05-25 LAB — CBC
HCT: 23.7 % — ABNORMAL LOW (ref 36.0–46.0)
Hemoglobin: 7.4 g/dL — ABNORMAL LOW (ref 12.0–15.0)
MCH: 30.3 pg (ref 26.0–34.0)
MCHC: 31.2 g/dL (ref 30.0–36.0)
MCV: 97.1 fL (ref 80.0–100.0)
Platelets: 21 10*3/uL — CL (ref 150–400)
RBC: 2.44 MIL/uL — ABNORMAL LOW (ref 3.87–5.11)
RDW: 18.6 % — ABNORMAL HIGH (ref 11.5–15.5)
WBC: 8.8 10*3/uL (ref 4.0–10.5)
nRBC: 0 % (ref 0.0–0.2)

## 2021-05-25 LAB — MAGNESIUM: Magnesium: 2.4 mg/dL (ref 1.7–2.4)

## 2021-05-25 LAB — APTT: aPTT: 51 seconds — ABNORMAL HIGH (ref 24–36)

## 2021-05-25 DEATH — deceased

## 2021-05-26 LAB — CBC
HCT: 22.4 % — ABNORMAL LOW (ref 36.0–46.0)
Hemoglobin: 7.1 g/dL — ABNORMAL LOW (ref 12.0–15.0)
MCH: 30.7 pg (ref 26.0–34.0)
MCHC: 31.7 g/dL (ref 30.0–36.0)
MCV: 97 fL (ref 80.0–100.0)
Platelets: 18 10*3/uL — CL (ref 150–400)
RBC: 2.31 MIL/uL — ABNORMAL LOW (ref 3.87–5.11)
RDW: 18.3 % — ABNORMAL HIGH (ref 11.5–15.5)
WBC: 6.1 10*3/uL (ref 4.0–10.5)
nRBC: 0 % (ref 0.0–0.2)

## 2021-05-26 LAB — TYPE AND SCREEN
ABO/RH(D): A POS
Antibody Screen: POSITIVE
DAT, IgG: POSITIVE
Donor AG Type: NEGATIVE
Donor AG Type: NEGATIVE
Unit division: 0
Unit division: 0

## 2021-05-26 LAB — BASIC METABOLIC PANEL
Anion gap: 10 (ref 5–15)
BUN: 48 mg/dL — ABNORMAL HIGH (ref 6–20)
CO2: 23 mmol/L (ref 22–32)
Calcium: 9.3 mg/dL (ref 8.9–10.3)
Chloride: 115 mmol/L — ABNORMAL HIGH (ref 98–111)
Creatinine, Ser: 1.45 mg/dL — ABNORMAL HIGH (ref 0.44–1.00)
GFR, Estimated: 44 mL/min — ABNORMAL LOW (ref >60)
Glucose, Bld: 122 mg/dL — ABNORMAL HIGH (ref 70–99)
Potassium: 3.8 mmol/L (ref 3.5–5.1)
Sodium: 148 mmol/L — ABNORMAL HIGH (ref 135–145)

## 2021-05-26 LAB — BPAM RBC
Blood Product Expiration Date: 202206082359
Blood Product Expiration Date: 202206082359
Unit Type and Rh: 6200
Unit Type and Rh: 6200

## 2021-05-26 NOTE — Progress Notes (Signed)
PROGRESS NOTE    RHILYNN PREYER  TKZ:[id number] DOB: 10/14/1971 DOA: 05/03/2021  Brief Narrative:  Tiffany Howell is an 50 y.o. female with medical history significant of continuous alcohol abuse, liver cirrhosis, diastolic congestive heart failure, obesity, myocardial infarction, past history of polysubstance abuse, marijuana and history of cocaine abuse with multiple admissions to the hospital was admitted to [facility] on 05/03/2021 until 05/19/2021.  Patient apparently was recently discharged from the hospital on 04/28/2021 when she was admitted with encephalopathy and sepsis with E. coli bacteremia, osteomyelitis and large sacral decubitus ulcer.  She underwent debridement on 04/16/2021 and a wound VAC was placed.  She apparently was supposed to be on IV antibiotics for a total duration of 6 weeks.  The patient however was sent back from the nursing home to the emergency room for severe thrombocytopenia.  In the ED patient was found to have a platelet count of 27,000.  It was thought that the thrombocytopenia was secondary to liver cirrhosis, acutely dropped from antibiotics as well as sepsis.  She had bacteremia with Acinetobacter and received treatment with IV gentamicin.  During the hospital stay she was seen by infectious disease.  Medications were adjusted frequently because of the thrombocytopenia.  She was started on IV vancomycin, meropenem, micafungin.  Infectious disease was following.  They recommended discontinuing the micafungin and continuing with IV vancomycin, IV doxycycline.  Her wound cultures were positive for MRSA.  Blood cultures were positive for coagulase-negative staph which was felt to be contaminant.  She also had hyperkalemia and had been on Aldactone and potassium supplements at home.  The hyperkalemia was corrected during the hospital stay.  She also had hyponatremia which was thought to be secondary to liver cirrhosis.  She was on fluid restriction  per nephrology.  Patient was also noted to have acute on chronic blood loss and was bleeding from her rectum.  She was seen by GI, EGD revealed nonbleeding esophageal ulcers.  Patient had to receive 2 units of packed PRBCs on 05/16/2021.  She also had encephalopathy during her hospital stay with intermittent confusion.  She became hypotensive during the hospital stay and required Levophed drip.  Due to her complex medical problems she was transferred and admitted to [facility].  She is on treatment with IV meropenem, p.o. doxycycline.  She is oriented x2.  She is having worsening renal function.  Also continues to have thrombocytopenia, anasarca.  Assessment & Plan:  Active Problems: Sacralcoccygeal osteomyelitis status post debridement Sepsis/bacteremia with Acinetobacter Stage IV sacrococcygeal pressure ulcer, left ischium unstageable pressure injury Acute on chronic renal failure Alcoholic liver cirrhosis Anasarca with volume overload Acute on chronic diastolic CHF Encephalopathy, toxic/metabolic Thrombocytopenia Acute blood loss anemia status post PRBCs History of anxiety/bipolar disorder Aortic stenosis  Sacral osteomyelitis: Patient is status post debridement at the acute facility.  She received multiple antibiotics at the acute facility.  She remains on treatment with IV meropenem, doxycycline.  Tentative end date for the meropenem would be 05/27/2021 and for the doxycycline 06/01/2021.  This will be approximately 4 weeks of IV antibiotics.  Continue local wound care.  She unfortunately has encephalopathy from liver cirrhosis and debility with decreased mobility therefore high risk for worsening of the pressure ulcer despite treatment.  Please monitor BUN/cr closely while on antibiotics.  Sepsis/bacteremia with Acinetobacter: Patient apparently had sepsis and bacteremia with Acinetobacter at the acute facility.  She was also on pressors at the acute facility briefly.  She was  treated with IV gentamicin at the acute facility.  Currently on IV meropenem, doxycycline.  She is high risk for recurrent sepsis because of her liver failure.  If she starts having any worsening fevers or worsening leukocytosis suggest to send for pan cultures.  Stage IV sacrococcygeal pressure ulcer, left ischium unstageable pressure injury: She is status post debridement of the sacrococcygeal pressure ulcer.  She has left ischium unstageable pressure injury.  On antibiotics as mentioned above.  Continue local wound care.  If the wounds are worsening, suggest consulting surgery for further debridement.  Unfortunately because of her decreased mobility, unable to offload she is high risk for further skin breakdown and worsening of the ulcers.  Please monitor BUN/creatinine closely while on antibiotics.  Acute on chronic renal failure/metabolic acidosis: She is continuing to have worsening BUN/creatinine.  She is high risk for developing hepatorenal syndrome because of the liver cirrhosis.  She was given bicarb. Please monitor BUN/cr closely while on antibiotics.  Nephrology consulted. Further management per primary team and nephrology. Avoid nephrotoxic medications.  Alcoholic liver cirrhosis: Patient on thiamine, folate, multivitamin.  On lactulose.  She is high risk for recurrent ascites. If worsening consider drainage and consider sending fluid for analysis as she is also high risk for SBP.  Currently on antibiotics as mentioned above.  Further management per primary team.  Anasarca with volume overload/diastolic congestive heart failure: Continue medications and management per the primary team.  Anasarca is multifactorial etiology likely secondary to the liver cirrhosis, decreased oncotic pressure from hypoalbuminemia, diastolic congestive heart failure.  On Lasix.  Encephalopathy: Likely toxic/metabolic.  High risk for hepatic encephalopathy.  On lactulose.  Continue supportive management per  primary team.  Thrombocytopenia: She has thrombocytopenia likely from the liver cirrhosis.  Antibiotics can also cause thrombocytopenia.  However, she needs antibiotics for the sacral osteomyelitis.  Continue to monitor platelet count closely.  Acute blood loss anemia status post PRBCs: Patient apparently had a nonbleeding ulcer on the EGD at the acute facility.  She received 2 units PRBCs at the acute facility.  Currently on Protonix.  Further investigation and management per the primary team.  History of anxiety/bipolar disorder: Continue medication and management per primary team.  Aortic stenosis: continue medications per the primary team.  Unfortunately due to her multiple complex medical problems she is very high risk for worsening and decompensation.  Overall poor prognosis. Plan of care discussed with the primary team and pharmacy.  Subjective: She is confused.  Has anasarca with volume overload, extensive bilateral lower extremity edema.  Has worsening T bili with scleral icterus.  Objective: Vitals: Temperature 97.2, pulse 100, respiratory rate 16, blood pressure 141/65, oxygen saturation 100%  Examination:  Constitutional:  Obese, ill-appearing female, confused Head: Atraumatic, normocephalic Eyes: PERLA, EOMI, irises appear normal, scleral icterus ENMT: external ears and nose appear normal, normal hearing, Lips appears normal, moist oral mucosa  Neck: Supple CVS: S1-S2  Respiratory: Decreased breath sound lower lobes, no rhonchi or wheezing Abdomen: Obese, distended, positive bowel sounds Musculoskeletal: Bilateral lower extremity edema Neuro: Confused, debility with generalized weakness Psych: Confused Skin: Stage IV sacrococcygeal pressure ulcer, left ischium unstageable pressure injury, no new rashes  Data Reviewed: I have personally reviewed following labs and imaging studies  CBC: Recent Labs  Lab 05/22/21 1650 05/23/21 0247 05/24/21 0333 05/25/21 0517  05/26/21 0431  WBC 8.8 6.4 11.3* 8.8 6.1  HGB 8.9* 8.3* 7.7* 7.4* 7.1*  HCT 27.1* 25.4* 23.6* 23.7* 22.4*  MCV 95.4 96.6 96.3 97.1  97.0  PLT 29* 25* 27* 21* 18*    Basic Metabolic Panel: Recent Labs  Lab 05/21/21 0220 05/21/21 1646 05/22/21 0703 05/23/21 0247 05/24/21 0333 05/25/21 0517 05/26/21 0431  NA 141  --  142 145 144 147* 148*  K 3.8  --  3.4* 3.6 3.6 3.9 3.8  CL 113*  --  117* 119* 115* 113* 115*  CO2 20*  --  18* 22 21* 23 23  GLUCOSE 163*  --  96 129* 120* 132* 122*  BUN 39*  --  40* 41* 43* 47* 48*  CREATININE 0.78  --  1.04* 1.17* 1.43* 1.54* 1.45*  CALCIUM 8.2*  --  8.2* 8.4* 8.3* 9.0 9.3  MG 1.5* 2.2  --  2.2  --  2.4  --     GFR: CrCl cannot be calculated (Unknown ideal weight.).  Liver Function Tests: No results for input(s): AST, ALT, ALKPHOS, BILITOT, PROT, ALBUMIN in the last 168 hours.  CBG: No results for input(s): GLUCAP in the last 168 hours.   No results found for this or any previous visit (from the past 240 hour(s)).   Radiology Studies: No results found.  Scheduled Meds: Please see MAR  Vonzella Nipple, MD  05/26/2021, 4:07 PM

## 2021-05-27 LAB — BPAM PLATELET PHERESIS
Blood Product Expiration Date: 202206032359
ISSUE DATE / TIME: 202206021250
Unit Type and Rh: 5100

## 2021-05-27 LAB — PREPARE FRESH FROZEN PLASMA: Unit division: 0

## 2021-05-27 LAB — PREPARE PLATELET PHERESIS: Unit division: 0

## 2021-05-27 LAB — BPAM FFP
Blood Product Expiration Date: 202206062359
ISSUE DATE / TIME: 202206020926
Unit Type and Rh: 6200

## 2021-05-28 LAB — CBC
HCT: 21.4 % — ABNORMAL LOW (ref 36.0–46.0)
HCT: 22.9 % — ABNORMAL LOW (ref 36.0–46.0)
Hemoglobin: 6.8 g/dL — CL (ref 12.0–15.0)
Hemoglobin: 7.3 g/dL — ABNORMAL LOW (ref 12.0–15.0)
MCH: 31.1 pg (ref 26.0–34.0)
MCH: 30.9 pg (ref 26.0–34.0)
MCHC: 31.8 g/dL (ref 30.0–36.0)
MCHC: 31.9 g/dL (ref 30.0–36.0)
MCV: 97.7 fL (ref 80.0–100.0)
MCV: 97 fL (ref 80.0–100.0)
Platelets: 29 10*3/uL — CL (ref 150–400)
Platelets: 44 10*3/uL — ABNORMAL LOW (ref 150–400)
RBC: 2.19 MIL/uL — ABNORMAL LOW (ref 3.87–5.11)
RBC: 2.36 MIL/uL — ABNORMAL LOW (ref 3.87–5.11)
RDW: 18.6 % — ABNORMAL HIGH (ref 11.5–15.5)
RDW: 18.4 % — ABNORMAL HIGH (ref 11.5–15.5)
WBC: 6.4 10*3/uL (ref 4.0–10.5)
WBC: 6.5 10*3/uL (ref 4.0–10.5)
nRBC: 0.3 % — ABNORMAL HIGH (ref 0.0–0.2)
nRBC: 0 % (ref 0.0–0.2)

## 2021-05-28 LAB — COMPREHENSIVE METABOLIC PANEL
ALT: 15 U/L (ref 0–44)
AST: 32 U/L (ref 15–41)
Albumin: 2.5 g/dL — ABNORMAL LOW (ref 3.5–5.0)
Alkaline Phosphatase: 87 U/L (ref 38–126)
Anion gap: 7 (ref 5–15)
BUN: 55 mg/dL — ABNORMAL HIGH (ref 6–20)
CO2: 24 mmol/L (ref 22–32)
Calcium: 9.6 mg/dL (ref 8.9–10.3)
Chloride: 119 mmol/L — ABNORMAL HIGH (ref 98–111)
Creatinine, Ser: 1.76 mg/dL — ABNORMAL HIGH (ref 0.44–1.00)
GFR, Estimated: 35 mL/min — ABNORMAL LOW (ref >60)
Glucose, Bld: 109 mg/dL — ABNORMAL HIGH (ref 70–99)
Potassium: 3.8 mmol/L (ref 3.5–5.1)
Sodium: 150 mmol/L — ABNORMAL HIGH (ref 135–145)
Total Bilirubin: 3.6 mg/dL — ABNORMAL HIGH (ref 0.3–1.2)
Total Protein: 4.4 g/dL — ABNORMAL LOW (ref 6.5–8.1)

## 2021-05-28 LAB — PREPARE RBC (CROSSMATCH)

## 2021-05-28 LAB — PROTIME-INR
INR: 3.1 — ABNORMAL HIGH (ref 0.8–1.2)
Prothrombin Time: 32 seconds — ABNORMAL HIGH (ref 11.4–15.2)

## 2021-05-29 LAB — BPAM PLATELET PHERESIS
Blood Product Expiration Date: 202206052359
Blood Product Expiration Date: 202206062359
ISSUE DATE / TIME: 202206041526
ISSUE DATE / TIME: 202206041526
Unit Type and Rh: 5100
Unit Type and Rh: 6200

## 2021-05-29 LAB — BPAM FFP
Blood Product Expiration Date: 202206062359
Blood Product Expiration Date: 202206092359
ISSUE DATE / TIME: 202206041315
ISSUE DATE / TIME: 202206041315
Unit Type and Rh: 6200
Unit Type and Rh: 6200

## 2021-05-29 LAB — PREPARE PLATELET PHERESIS
Unit division: 0
Unit division: 0

## 2021-05-29 LAB — COMPREHENSIVE METABOLIC PANEL
ALT: 17 U/L (ref 0–44)
AST: 36 U/L (ref 15–41)
Albumin: 2.9 g/dL — ABNORMAL LOW (ref 3.5–5.0)
Alkaline Phosphatase: 101 U/L (ref 38–126)
Anion gap: 9 (ref 5–15)
BUN: 58 mg/dL — ABNORMAL HIGH (ref 6–20)
CO2: 20 mmol/L — ABNORMAL LOW (ref 22–32)
Calcium: 9.9 mg/dL (ref 8.9–10.3)
Chloride: 120 mmol/L — ABNORMAL HIGH (ref 98–111)
Creatinine, Ser: 1.86 mg/dL — ABNORMAL HIGH (ref 0.44–1.00)
GFR, Estimated: 32 mL/min — ABNORMAL LOW (ref >60)
Glucose, Bld: 126 mg/dL — ABNORMAL HIGH (ref 70–99)
Potassium: 4 mmol/L (ref 3.5–5.1)
Sodium: 149 mmol/L — ABNORMAL HIGH (ref 135–145)
Total Bilirubin: 3.5 mg/dL — ABNORMAL HIGH (ref 0.3–1.2)
Total Protein: 5.5 g/dL — ABNORMAL LOW (ref 6.5–8.1)

## 2021-05-29 LAB — CBC
HCT: 27 % — ABNORMAL LOW (ref 36.0–46.0)
Hemoglobin: 8.7 g/dL — ABNORMAL LOW (ref 12.0–15.0)
MCH: 31 pg (ref 26.0–34.0)
MCHC: 32.2 g/dL (ref 30.0–36.0)
MCV: 96.1 fL (ref 80.0–100.0)
Platelets: 49 10*3/uL — ABNORMAL LOW (ref 150–400)
RBC: 2.81 MIL/uL — ABNORMAL LOW (ref 3.87–5.11)
RDW: 18.5 % — ABNORMAL HIGH (ref 11.5–15.5)
WBC: 9.3 10*3/uL (ref 4.0–10.5)
nRBC: 0.2 % (ref 0.0–0.2)

## 2021-05-29 LAB — PREPARE FRESH FROZEN PLASMA
Unit division: 0
Unit division: 0

## 2021-05-29 LAB — MAGNESIUM: Magnesium: 2.2 mg/dL (ref 1.7–2.4)

## 2021-05-30 LAB — AMMONIA: Ammonia: 41 umol/L — ABNORMAL HIGH (ref 9–35)

## 2021-05-31 LAB — CBC
HCT: 25 % — ABNORMAL LOW (ref 36.0–46.0)
Hemoglobin: 8.1 g/dL — ABNORMAL LOW (ref 12.0–15.0)
MCH: 30.7 pg (ref 26.0–34.0)
MCHC: 32.4 g/dL (ref 30.0–36.0)
MCV: 94.7 fL (ref 80.0–100.0)
Platelets: 53 10*3/uL — ABNORMAL LOW (ref 150–400)
RBC: 2.64 MIL/uL — ABNORMAL LOW (ref 3.87–5.11)
RDW: 18.3 % — ABNORMAL HIGH (ref 11.5–15.5)
WBC: 11.4 10*3/uL — ABNORMAL HIGH (ref 4.0–10.5)
nRBC: 0.3 % — ABNORMAL HIGH (ref 0.0–0.2)

## 2021-05-31 LAB — BASIC METABOLIC PANEL
Anion gap: 14 (ref 5–15)
BUN: 63 mg/dL — ABNORMAL HIGH (ref 6–20)
CO2: 18 mmol/L — ABNORMAL LOW (ref 22–32)
Calcium: 9.1 mg/dL (ref 8.9–10.3)
Chloride: 108 mmol/L (ref 98–111)
Creatinine, Ser: 1.99 mg/dL — ABNORMAL HIGH (ref 0.44–1.00)
GFR, Estimated: 30 mL/min — ABNORMAL LOW (ref >60)
Glucose, Bld: 110 mg/dL — ABNORMAL HIGH (ref 70–99)
Potassium: 3.9 mmol/L (ref 3.5–5.1)
Sodium: 140 mmol/L (ref 135–145)

## 2021-05-31 LAB — PROTIME-INR
INR: 2.7 — ABNORMAL HIGH (ref 0.8–1.2)
Prothrombin Time: 29 seconds — ABNORMAL HIGH (ref 11.4–15.2)

## 2021-06-01 ENCOUNTER — Other Ambulatory Visit (HOSPITAL_COMMUNITY): Payer: BC Managed Care – PPO

## 2021-06-01 LAB — TYPE AND SCREEN
ABO/RH(D): A POS
Antibody Screen: POSITIVE
DAT, IgG: NEGATIVE
Donor AG Type: NEGATIVE
Donor AG Type: NEGATIVE
Unit division: 0
Unit division: 0

## 2021-06-01 LAB — BPAM RBC
Blood Product Expiration Date: 202207082359
Blood Product Expiration Date: 202207082359
ISSUE DATE / TIME: 202206041743
Unit Type and Rh: 6200
Unit Type and Rh: 6200

## 2021-06-02 LAB — CBC
HCT: 27.4 % — ABNORMAL LOW (ref 36.0–46.0)
Hemoglobin: 8.7 g/dL — ABNORMAL LOW (ref 12.0–15.0)
MCH: 30.3 pg (ref 26.0–34.0)
MCHC: 31.8 g/dL (ref 30.0–36.0)
MCV: 95.5 fL (ref 80.0–100.0)
Platelets: 33 10*3/uL — ABNORMAL LOW (ref 150–400)
RBC: 2.87 MIL/uL — ABNORMAL LOW (ref 3.87–5.11)
RDW: 18.6 % — ABNORMAL HIGH (ref 11.5–15.5)
WBC: 13.5 10*3/uL — ABNORMAL HIGH (ref 4.0–10.5)
nRBC: 0.1 % (ref 0.0–0.2)

## 2021-06-02 LAB — BASIC METABOLIC PANEL
Anion gap: 12 (ref 5–15)
BUN: 68 mg/dL — ABNORMAL HIGH (ref 6–20)
CO2: 18 mmol/L — ABNORMAL LOW (ref 22–32)
Calcium: 9.2 mg/dL (ref 8.9–10.3)
Chloride: 110 mmol/L (ref 98–111)
Creatinine, Ser: 2.46 mg/dL — ABNORMAL HIGH (ref 0.44–1.00)
GFR, Estimated: 23 mL/min — ABNORMAL LOW (ref >60)
Glucose, Bld: 108 mg/dL — ABNORMAL HIGH (ref 70–99)
Potassium: 4 mmol/L (ref 3.5–5.1)
Sodium: 140 mmol/L (ref 135–145)

## 2021-06-04 LAB — COMPREHENSIVE METABOLIC PANEL
ALT: 14 U/L (ref 0–44)
AST: 30 U/L (ref 15–41)
Albumin: 2.6 g/dL — ABNORMAL LOW (ref 3.5–5.0)
Alkaline Phosphatase: 79 U/L (ref 38–126)
Anion gap: 14 (ref 5–15)
BUN: 81 mg/dL — ABNORMAL HIGH (ref 6–20)
CO2: 18 mmol/L — ABNORMAL LOW (ref 22–32)
Calcium: 9.1 mg/dL (ref 8.9–10.3)
Chloride: 113 mmol/L — ABNORMAL HIGH (ref 98–111)
Creatinine, Ser: 2.91 mg/dL — ABNORMAL HIGH (ref 0.44–1.00)
GFR, Estimated: 19 mL/min — ABNORMAL LOW (ref >60)
Glucose, Bld: 112 mg/dL — ABNORMAL HIGH (ref 70–99)
Potassium: 3.3 mmol/L — ABNORMAL LOW (ref 3.5–5.1)
Sodium: 145 mmol/L (ref 135–145)
Total Bilirubin: 4.5 mg/dL — ABNORMAL HIGH (ref 0.3–1.2)
Total Protein: 4.9 g/dL — ABNORMAL LOW (ref 6.5–8.1)

## 2021-06-04 LAB — CBC
HCT: 22.6 % — ABNORMAL LOW (ref 36.0–46.0)
Hemoglobin: 7.3 g/dL — ABNORMAL LOW (ref 12.0–15.0)
MCH: 30.8 pg (ref 26.0–34.0)
MCHC: 32.3 g/dL (ref 30.0–36.0)
MCV: 95.4 fL (ref 80.0–100.0)
Platelets: UNDETERMINED 10*3/uL (ref 150–400)
RBC: 2.37 MIL/uL — ABNORMAL LOW (ref 3.87–5.11)
RDW: 18.7 % — ABNORMAL HIGH (ref 11.5–15.5)
WBC: 5.7 10*3/uL (ref 4.0–10.5)
nRBC: 0.4 % — ABNORMAL HIGH (ref 0.0–0.2)

## 2021-06-04 LAB — AMMONIA: Ammonia: 48 umol/L — ABNORMAL HIGH (ref 9–35)

## 2021-06-05 LAB — BLOOD GAS, ARTERIAL
Acid-base deficit: 8.5 mmol/L — ABNORMAL HIGH (ref 0.0–2.0)
Acid-base deficit: 8.2 mmol/L — ABNORMAL HIGH (ref 0.0–2.0)
Bicarbonate: 18 mmol/L — ABNORMAL LOW (ref 20.0–28.0)
Bicarbonate: 17.8 mmol/L — ABNORMAL LOW (ref 20.0–28.0)
FIO2: 28
FIO2: 40
O2 Saturation: 85.9 %
O2 Saturation: 99 %
Patient temperature: 35.8
Patient temperature: 37
pCO2 arterial: 44.1 mmHg (ref 32.0–48.0)
pCO2 arterial: 42.5 mmHg (ref 32.0–48.0)
pH, Arterial: 7.226 — ABNORMAL LOW (ref 7.350–7.450)
pH, Arterial: 7.245 — ABNORMAL LOW (ref 7.350–7.450)
pO2, Arterial: 60.7 mmHg — ABNORMAL LOW (ref 83.0–108.0)
pO2, Arterial: 158 mmHg — ABNORMAL HIGH (ref 83.0–108.0)

## 2021-06-05 LAB — CBC
HCT: 20.3 % — ABNORMAL LOW (ref 36.0–46.0)
Hemoglobin: 6.3 g/dL — CL (ref 12.0–15.0)
MCH: 30.3 pg (ref 26.0–34.0)
MCHC: 31 g/dL (ref 30.0–36.0)
MCV: 97.6 fL (ref 80.0–100.0)
Platelets: 13 10*3/uL — CL (ref 150–400)
RBC: 2.08 MIL/uL — ABNORMAL LOW (ref 3.87–5.11)
RDW: 18.9 % — ABNORMAL HIGH (ref 11.5–15.5)
WBC: 6.8 10*3/uL (ref 4.0–10.5)
nRBC: 0 % (ref 0.0–0.2)

## 2021-06-05 LAB — BASIC METABOLIC PANEL
Anion gap: 14 (ref 5–15)
BUN: 84 mg/dL — ABNORMAL HIGH (ref 6–20)
CO2: 18 mmol/L — ABNORMAL LOW (ref 22–32)
Calcium: 9.2 mg/dL (ref 8.9–10.3)
Chloride: 115 mmol/L — ABNORMAL HIGH (ref 98–111)
Creatinine, Ser: 2.95 mg/dL — ABNORMAL HIGH (ref 0.44–1.00)
GFR, Estimated: 19 mL/min — ABNORMAL LOW (ref >60)
Glucose, Bld: 129 mg/dL — ABNORMAL HIGH (ref 70–99)
Potassium: 3.2 mmol/L — ABNORMAL LOW (ref 3.5–5.1)
Sodium: 147 mmol/L — ABNORMAL HIGH (ref 135–145)

## 2021-06-05 LAB — PREPARE RBC (CROSSMATCH)

## 2021-06-05 LAB — MAGNESIUM: Magnesium: 1.9 mg/dL (ref 1.7–2.4)

## 2021-06-06 LAB — COMPREHENSIVE METABOLIC PANEL
ALT: 10 U/L (ref 0–44)
AST: 24 U/L (ref 15–41)
Albumin: 3 g/dL — ABNORMAL LOW (ref 3.5–5.0)
Alkaline Phosphatase: 69 U/L (ref 38–126)
Anion gap: 16 — ABNORMAL HIGH (ref 5–15)
BUN: 88 mg/dL — ABNORMAL HIGH (ref 6–20)
CO2: 18 mmol/L — ABNORMAL LOW (ref 22–32)
Calcium: 9.3 mg/dL (ref 8.9–10.3)
Chloride: 112 mmol/L — ABNORMAL HIGH (ref 98–111)
Creatinine, Ser: 3.27 mg/dL — ABNORMAL HIGH (ref 0.44–1.00)
GFR, Estimated: 16 mL/min — ABNORMAL LOW (ref >60)
Glucose, Bld: 87 mg/dL (ref 70–99)
Potassium: 3.8 mmol/L (ref 3.5–5.1)
Sodium: 146 mmol/L — ABNORMAL HIGH (ref 135–145)
Total Bilirubin: 4.2 mg/dL — ABNORMAL HIGH (ref 0.3–1.2)
Total Protein: 5.2 g/dL — ABNORMAL LOW (ref 6.5–8.1)

## 2021-06-06 LAB — PREPARE PLATELET PHERESIS: Unit division: 0

## 2021-06-06 LAB — BLOOD GAS, ARTERIAL
Acid-base deficit: 8.3 mmol/L — ABNORMAL HIGH (ref 0.0–2.0)
Bicarbonate: 17.4 mmol/L — ABNORMAL LOW (ref 20.0–28.0)
FIO2: 30
O2 Saturation: 97 %
Patient temperature: 36.1
pCO2 arterial: 38.4 mmHg (ref 32.0–48.0)
pH, Arterial: 7.273 — ABNORMAL LOW (ref 7.350–7.450)
pO2, Arterial: 97.8 mmHg (ref 83.0–108.0)

## 2021-06-06 LAB — CBC
HCT: 21.7 % — ABNORMAL LOW (ref 36.0–46.0)
HCT: 26.3 % — ABNORMAL LOW (ref 36.0–46.0)
Hemoglobin: 6.8 g/dL — CL (ref 12.0–15.0)
Hemoglobin: 8.5 g/dL — ABNORMAL LOW (ref 12.0–15.0)
MCH: 30 pg (ref 26.0–34.0)
MCH: 30.7 pg (ref 26.0–34.0)
MCHC: 31.3 g/dL (ref 30.0–36.0)
MCHC: 32.3 g/dL (ref 30.0–36.0)
MCV: 95.6 fL (ref 80.0–100.0)
MCV: 94.9 fL (ref 80.0–100.0)
Platelets: 16 10*3/uL — CL (ref 150–400)
Platelets: 18 10*3/uL — CL (ref 150–400)
RBC: 2.27 MIL/uL — ABNORMAL LOW (ref 3.87–5.11)
RBC: 2.77 MIL/uL — ABNORMAL LOW (ref 3.87–5.11)
RDW: 19 % — ABNORMAL HIGH (ref 11.5–15.5)
RDW: 18.4 % — ABNORMAL HIGH (ref 11.5–15.5)
WBC: 12.5 10*3/uL — ABNORMAL HIGH (ref 4.0–10.5)
WBC: 17.2 10*3/uL — ABNORMAL HIGH (ref 4.0–10.5)
nRBC: 0.2 % (ref 0.0–0.2)
nRBC: 0.2 % (ref 0.0–0.2)

## 2021-06-06 LAB — BPAM FFP
Blood Product Expiration Date: 202206152359
ISSUE DATE / TIME: 202206121009
Unit Type and Rh: 6200

## 2021-06-06 LAB — BPAM PLATELET PHERESIS
Blood Product Expiration Date: 202206122359
ISSUE DATE / TIME: 202206120825
Unit Type and Rh: 5100

## 2021-06-06 LAB — PREPARE RBC (CROSSMATCH)

## 2021-06-06 LAB — PREPARE FRESH FROZEN PLASMA: Unit division: 0

## 2021-06-06 LAB — PROTIME-INR
INR: 3.5 — ABNORMAL HIGH (ref 0.8–1.2)
Prothrombin Time: 35.3 seconds — ABNORMAL HIGH (ref 11.4–15.2)

## 2021-06-07 LAB — BASIC METABOLIC PANEL
Anion gap: 20 — ABNORMAL HIGH (ref 5–15)
BUN: 96 mg/dL — ABNORMAL HIGH (ref 6–20)
CO2: 13 mmol/L — ABNORMAL LOW (ref 22–32)
Calcium: 9.2 mg/dL (ref 8.9–10.3)
Chloride: 112 mmol/L — ABNORMAL HIGH (ref 98–111)
Creatinine, Ser: 3.74 mg/dL — ABNORMAL HIGH (ref 0.44–1.00)
GFR, Estimated: 14 mL/min — ABNORMAL LOW (ref >60)
Glucose, Bld: 96 mg/dL (ref 70–99)
Potassium: 4.4 mmol/L (ref 3.5–5.1)
Sodium: 145 mmol/L (ref 135–145)

## 2021-06-07 LAB — CBC
HCT: 26.5 % — ABNORMAL LOW (ref 36.0–46.0)
Hemoglobin: 8.2 g/dL — ABNORMAL LOW (ref 12.0–15.0)
MCH: 30.6 pg (ref 26.0–34.0)
MCHC: 30.9 g/dL (ref 30.0–36.0)
MCV: 98.9 fL (ref 80.0–100.0)
Platelets: 21 10*3/uL — CL (ref 150–400)
RBC: 2.68 MIL/uL — ABNORMAL LOW (ref 3.87–5.11)
RDW: 19.2 % — ABNORMAL HIGH (ref 11.5–15.5)
WBC: 22.2 10*3/uL — ABNORMAL HIGH (ref 4.0–10.5)
nRBC: 0.3 % — ABNORMAL HIGH (ref 0.0–0.2)

## 2021-06-08 LAB — AMMONIA: Ammonia: 167 umol/L — ABNORMAL HIGH (ref 9–35)

## 2021-06-08 LAB — CBC
HCT: 23.5 % — ABNORMAL LOW (ref 36.0–46.0)
Hemoglobin: 7.6 g/dL — ABNORMAL LOW (ref 12.0–15.0)
MCH: 30.8 pg (ref 26.0–34.0)
MCHC: 32.3 g/dL (ref 30.0–36.0)
MCV: 95.1 fL (ref 80.0–100.0)
Platelets: 33 10*3/uL — ABNORMAL LOW (ref 150–400)
RBC: 2.47 MIL/uL — ABNORMAL LOW (ref 3.87–5.11)
RDW: 19.4 % — ABNORMAL HIGH (ref 11.5–15.5)
WBC: 16.9 10*3/uL — ABNORMAL HIGH (ref 4.0–10.5)
nRBC: 0.2 % (ref 0.0–0.2)

## 2021-06-08 LAB — PREPARE FRESH FROZEN PLASMA

## 2021-06-08 LAB — BPAM FFP
Blood Product Expiration Date: 202206152359
ISSUE DATE / TIME: 202206142305
Unit Type and Rh: 6200

## 2021-06-09 LAB — BPAM RBC
Blood Product Expiration Date: 202207082359
Blood Product Expiration Date: 202207082359
Blood Product Expiration Date: 202207082359
ISSUE DATE / TIME: 202206121752
ISSUE DATE / TIME: 202206131432
ISSUE DATE / TIME: 202206150136
Unit Type and Rh: 6200
Unit Type and Rh: 6200
Unit Type and Rh: 6200

## 2021-06-09 LAB — TYPE AND SCREEN
ABO/RH(D): A POS
Antibody Screen: POSITIVE
DAT, IgG: NEGATIVE
Donor AG Type: NEGATIVE
Donor AG Type: NEGATIVE
Donor AG Type: NEGATIVE
Unit division: 0
Unit division: 0
Unit division: 0

## 2021-06-09 LAB — BPAM PLATELET PHERESIS
Blood Product Expiration Date: 202206142359
Blood Product Expiration Date: 202206152359
ISSUE DATE / TIME: 202206141651
ISSUE DATE / TIME: 202206150151
Unit Type and Rh: 5100
Unit Type and Rh: 6200

## 2021-06-09 LAB — PREPARE PLATELET PHERESIS
Unit division: 0
Unit division: 0

## 2021-06-24 DEATH — deceased

## 2021-09-01 IMAGING — DX DG ABD PORTABLE 1V
3 series · 3 of 3 positions shown · non-contrast
Comparison: CT abdomen pelvis dated 02/21/2021.

CLINICAL DATA: 49-year-old female with pneumobilia.

EXAM:
PORTABLE ABDOMEN - 1 VIEW

[abdomen kub (1 of 3)]
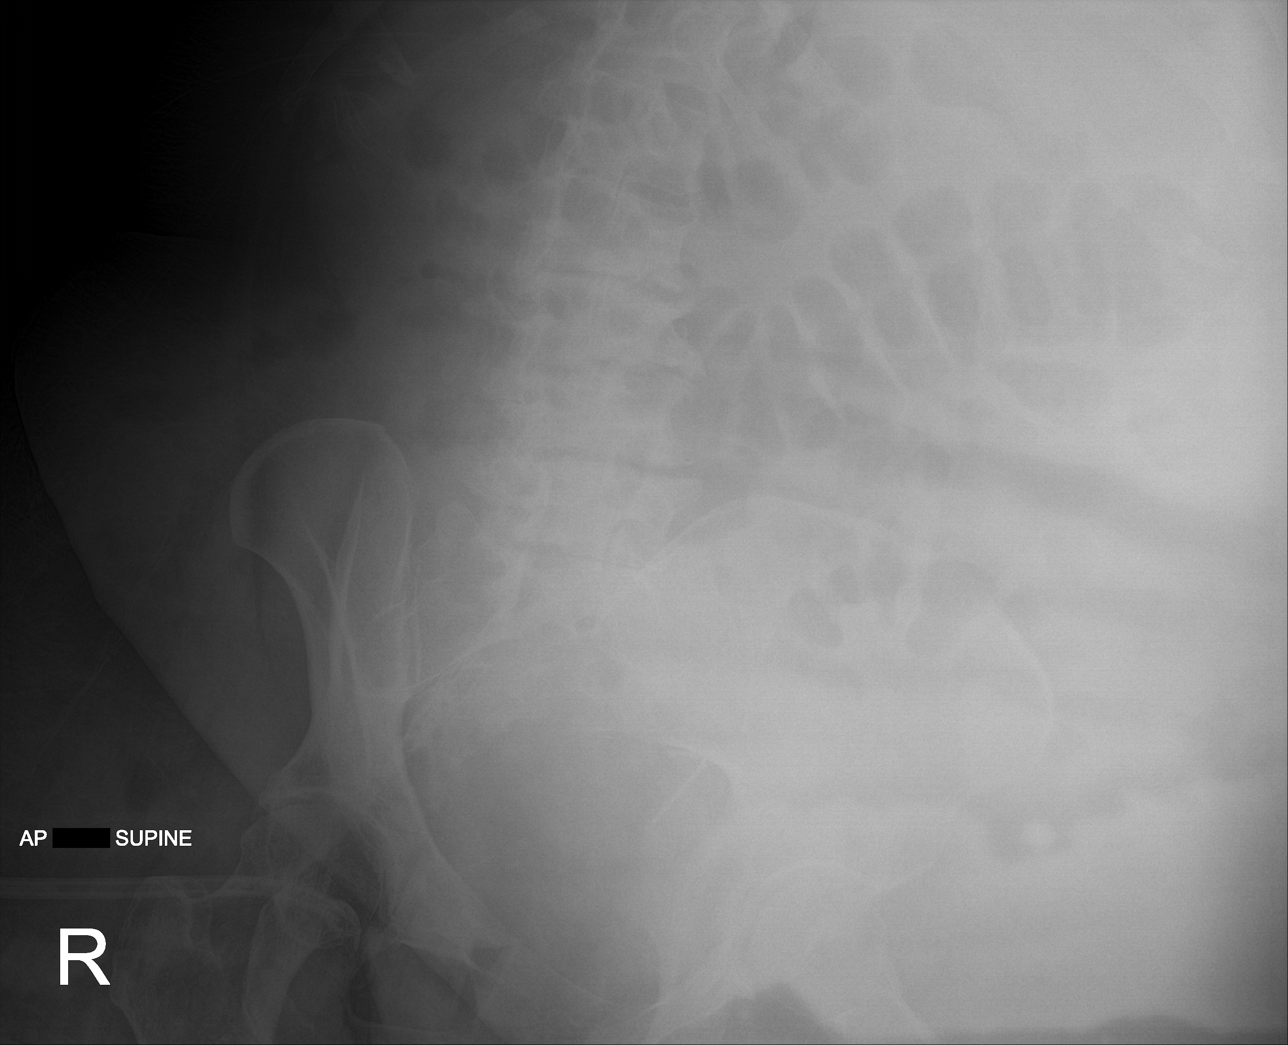

[abdomen kub (2 of 3)]
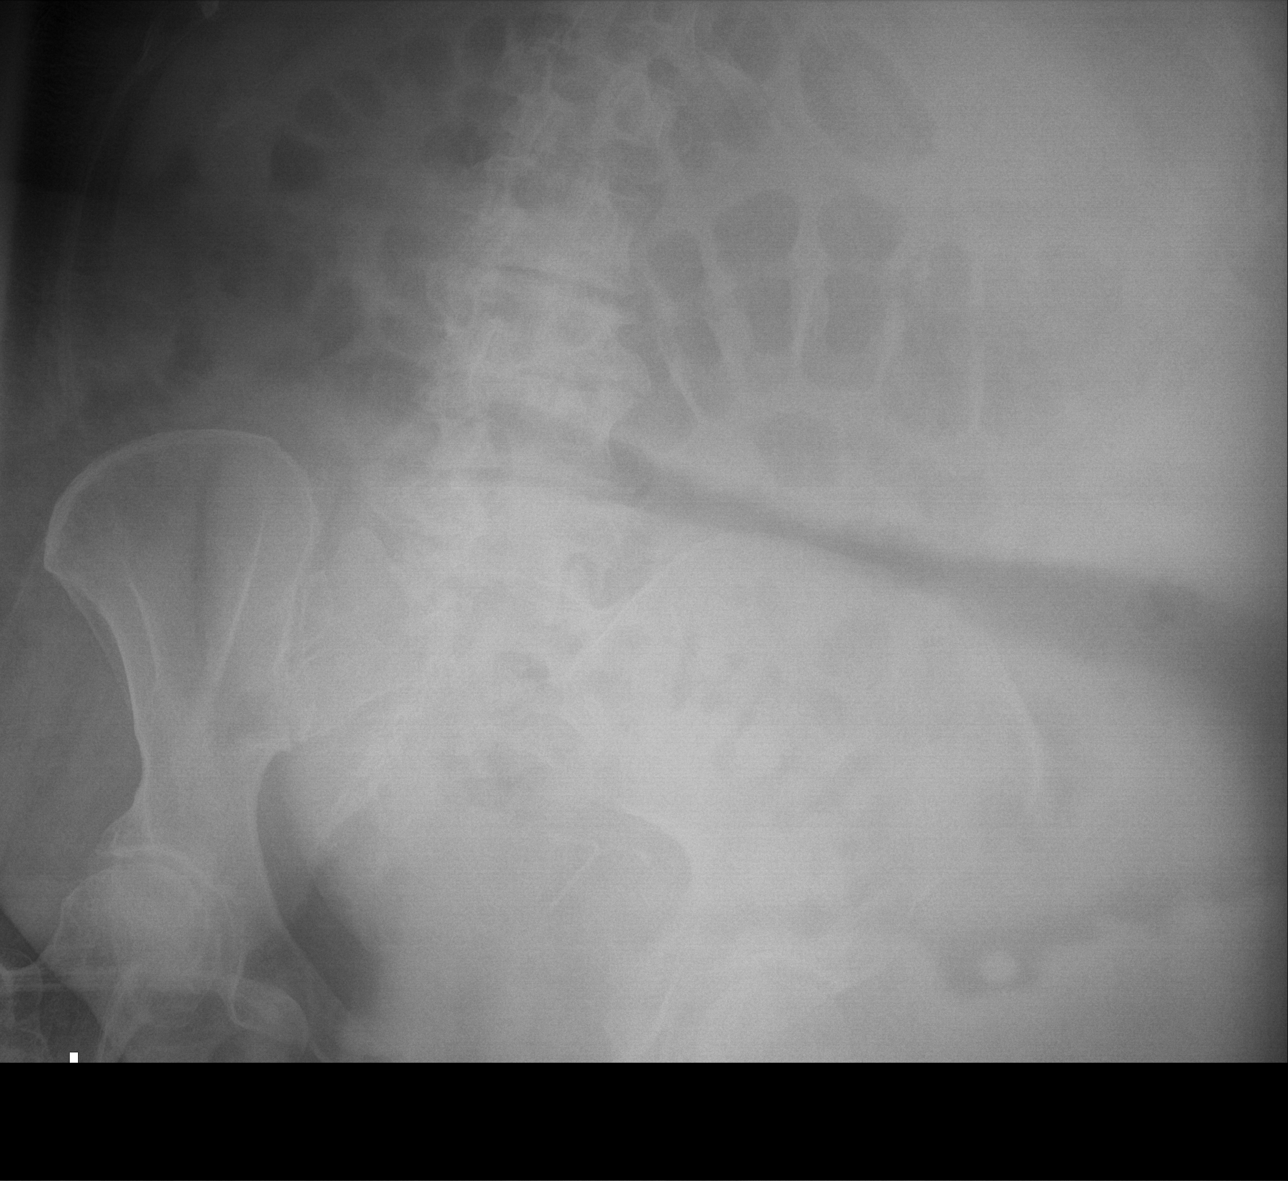

[abdomen kub (3 of 3)]
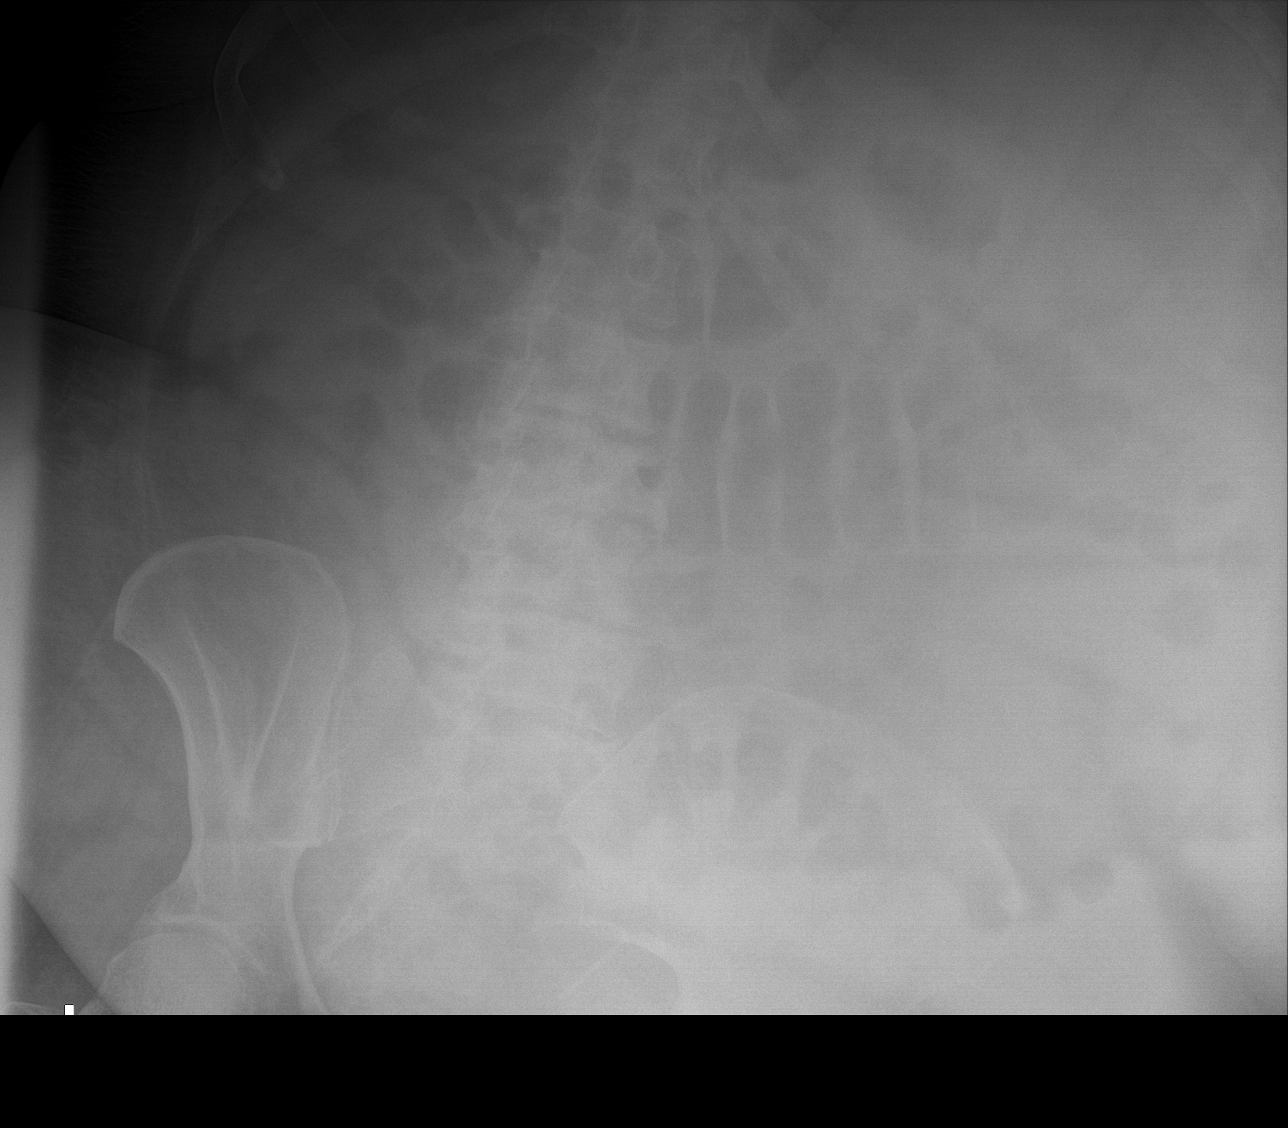

[3 of 3 positions shown; findings below may reference images not displayed]

FINDINGS: Evaluation is very limited due to body habitus.

No bowel dilatation or evidence of obstruction. Air is noted within
the colon. There is degenerative changes of the spine. No acute
osseous pathology.
IMPRESSION: No evidence of bowel obstruction.

## 2021-09-04 IMAGING — US US RENAL
1 series · 14 of 25 positions shown · non-contrast
Comparison: February 21, 2021

CLINICAL DATA: Decreased urine production

EXAM:
RENAL / URINARY TRACT ULTRASOUND COMPLETE

[Series 1: us renal · 14 of 46 slices shown]
[im 1/46]
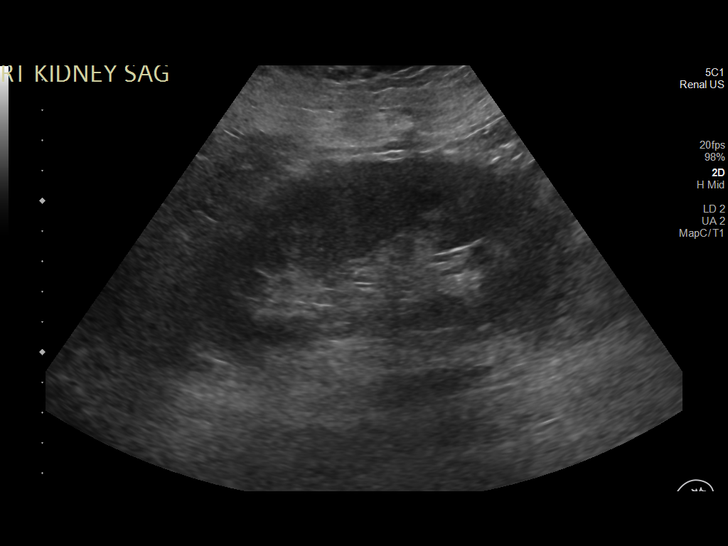
[im 4/46]
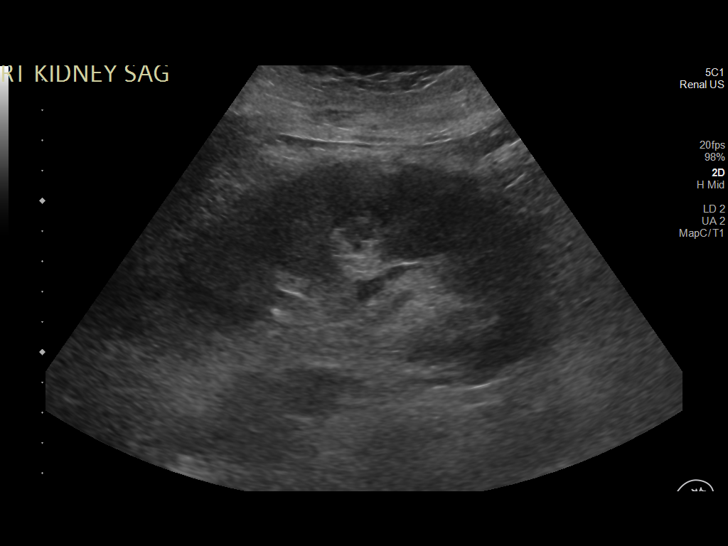
[im 8/46]
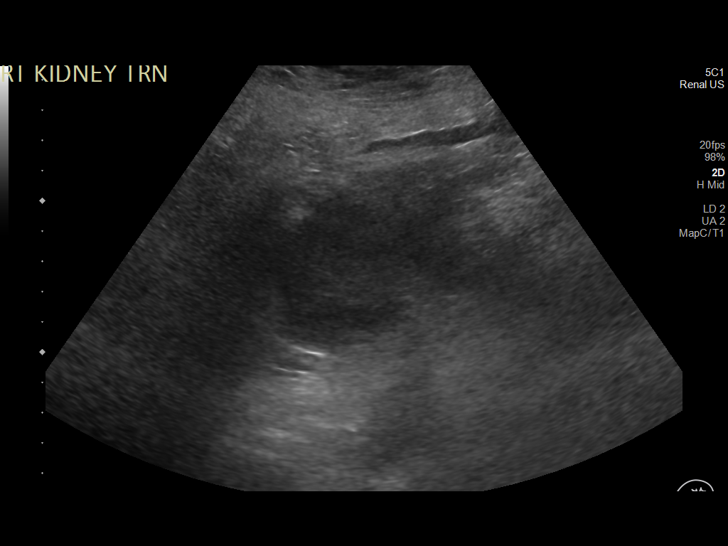
[im 12/46]
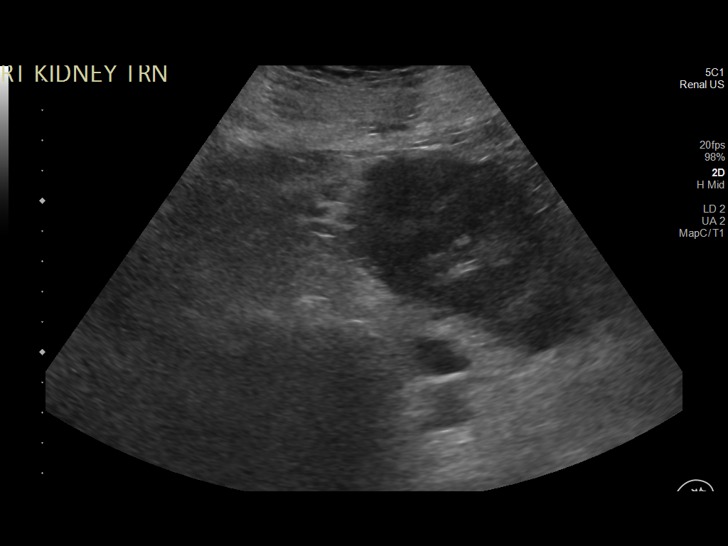
[im 16/46]
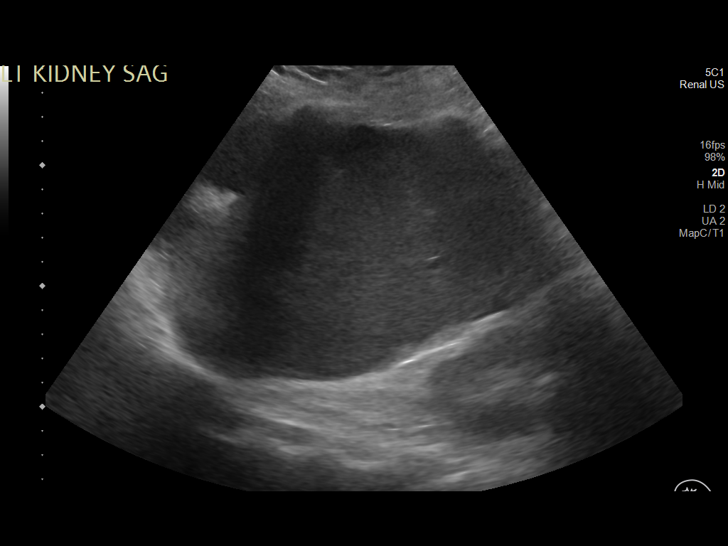
[im 17/46]
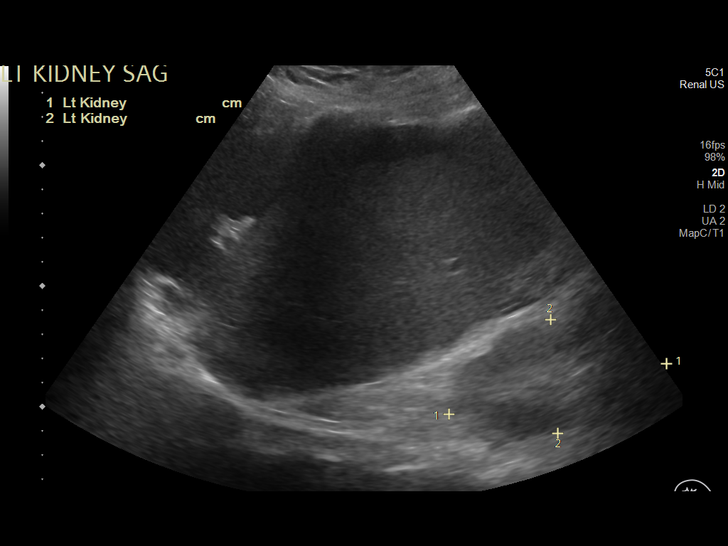
[im 21/46]
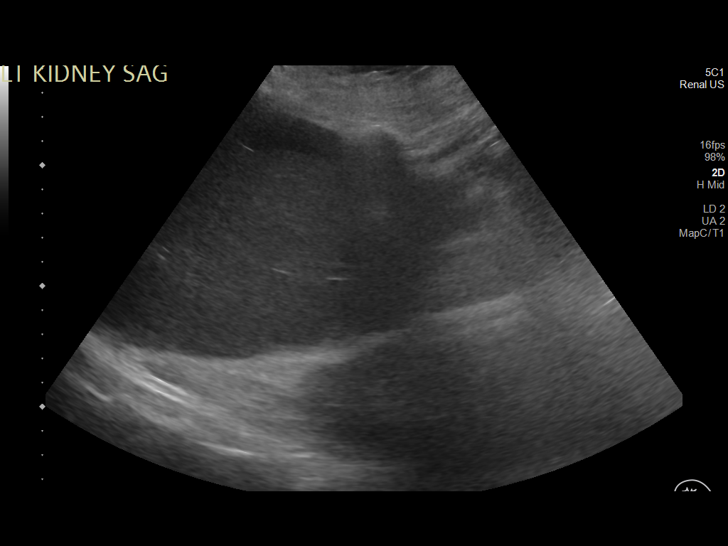
[im 25/46]
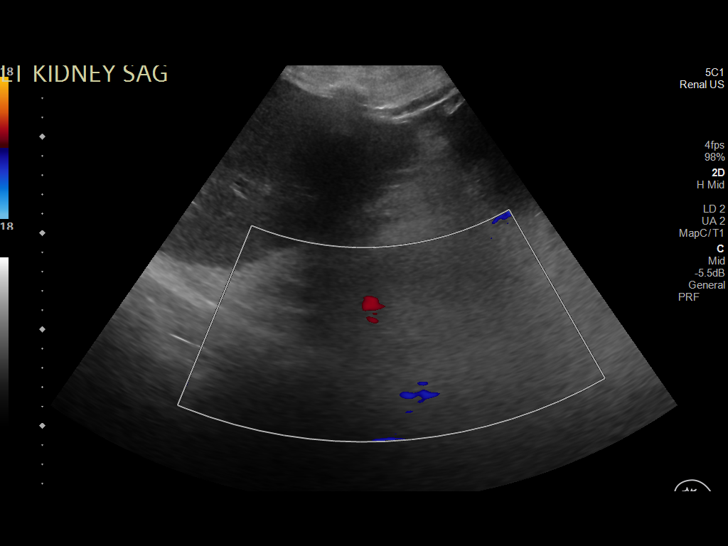
[im 29/46]
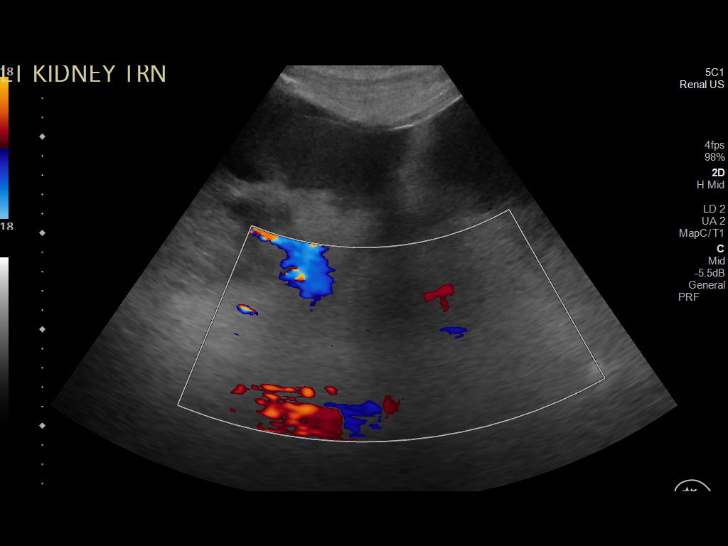
[im 31/46]
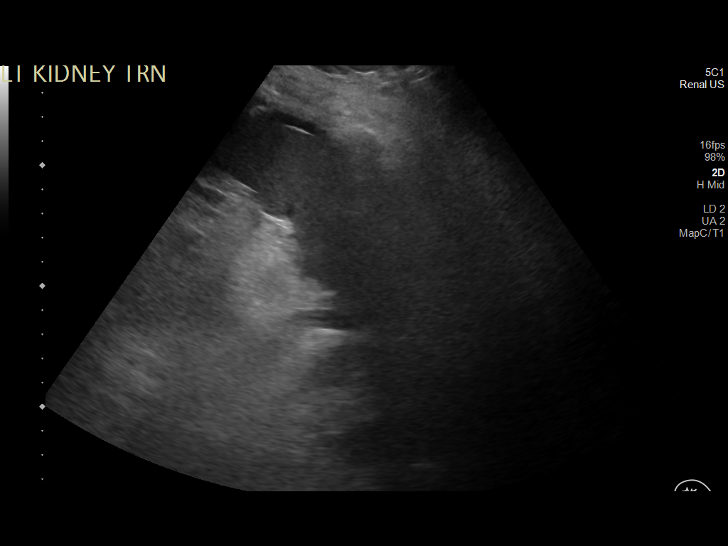
[im 34/46]
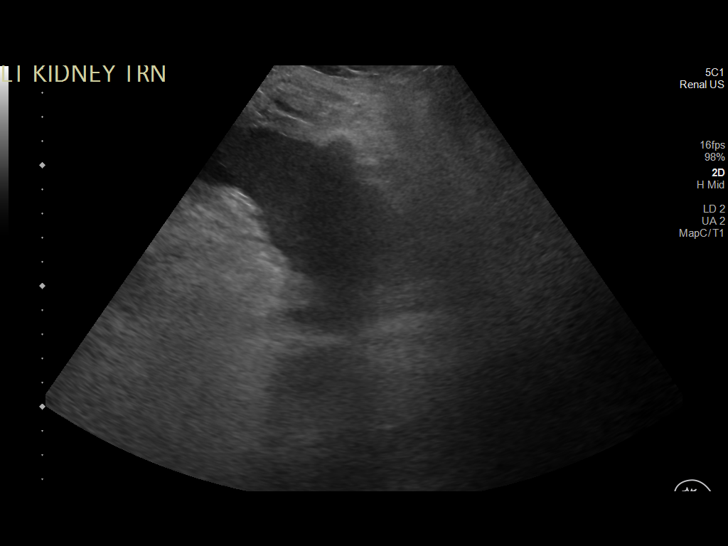
[im 38/46]
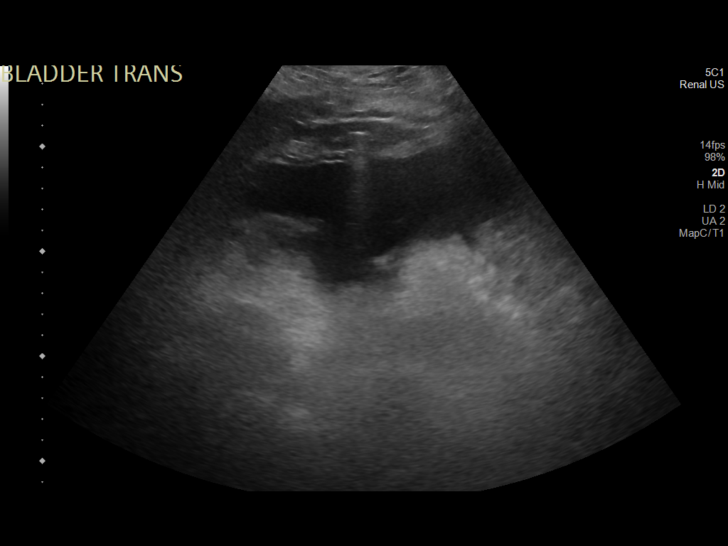
[im 42/46]
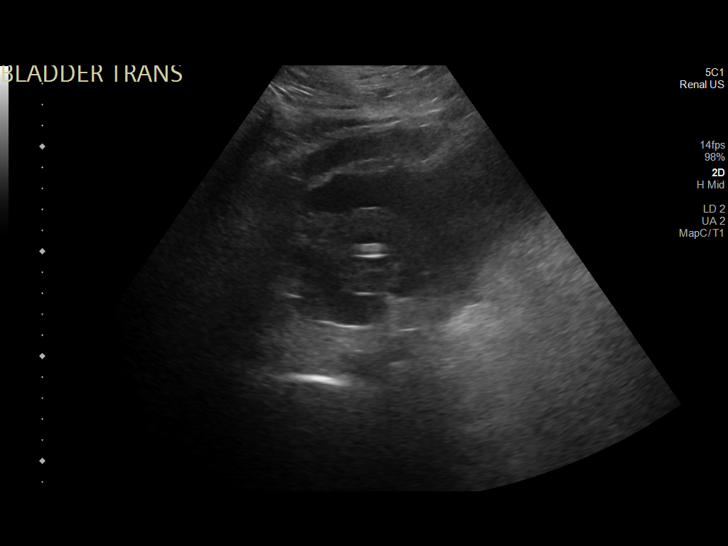
[im 46/46]
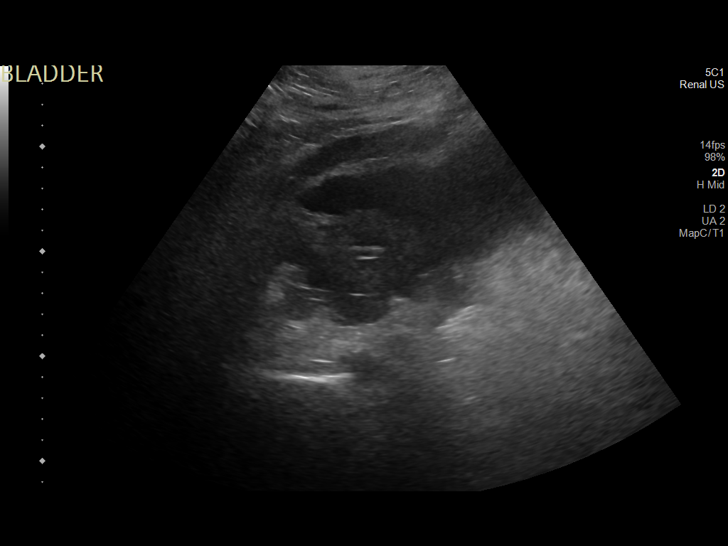

[14 of 25 positions shown; findings below may reference images not displayed]

FINDINGS: Limited evaluation secondary to body habitus and patient immobility.

Right Kidney:

Renal measurements: 12.0 x 5.8 x 5.7 cm = volume: 200 an in mL.
Echogenicity within normal limits. No mass or hydronephrosis
visualized.

Left Kidney: Limited evaluation due to limited sonographic windows,
body habitus and patient immobility.

Renal measurements: 9.3 x 4.7 x 5.3 cm = volume: 122 mL.
Echogenicity within normal limits. No hydronephrosis visualized.

Bladder:

Decompressed around a Foley catheter.

Other:

LEFT-sided ascites.
IMPRESSION: 1. No hydronephrosis.
2. Small volume ascites.
